# Patient Record
Sex: Female | Born: 1935
Health system: Southern US, Community
[De-identification: ages and names within clinical notes are randomized; demographics above are authoritative.]

## PROBLEM LIST (undated history)

## (undated) DIAGNOSIS — K224 Dyskinesia of esophagus: Secondary | ICD-10-CM

## (undated) DIAGNOSIS — N200 Calculus of kidney: Secondary | ICD-10-CM

## (undated) DIAGNOSIS — H269 Unspecified cataract: Secondary | ICD-10-CM

## (undated) DIAGNOSIS — C55 Malignant neoplasm of uterus, part unspecified: Secondary | ICD-10-CM

## (undated) DIAGNOSIS — I10 Essential (primary) hypertension: Secondary | ICD-10-CM

## (undated) HISTORY — DX: Dyskinesia of esophagus: K22.4

## (undated) HISTORY — PX: ABDOMINAL HYSTERECTOMY: SHX81

## (undated) HISTORY — PX: CATARACT EXTRACTION, BILATERAL: SHX1313

## (undated) HISTORY — DX: Malignant neoplasm of uterus, part unspecified: C55

## (undated) HISTORY — PX: BACK SURGERY: SHX140

## (undated) HISTORY — DX: Unspecified cataract: H26.9

## (undated) HISTORY — DX: Calculus of kidney: N20.0

---

## 2007-08-31 ENCOUNTER — Encounter: Payer: Self-pay | Admitting: Family Medicine

## 2007-10-06 ENCOUNTER — Encounter: Payer: Self-pay | Admitting: Family Medicine

## 2007-10-12 ENCOUNTER — Ambulatory Visit: Payer: Self-pay | Admitting: Family Medicine

## 2007-10-12 DIAGNOSIS — I10 Essential (primary) hypertension: Secondary | ICD-10-CM

## 2007-10-12 DIAGNOSIS — E785 Hyperlipidemia, unspecified: Secondary | ICD-10-CM

## 2007-10-26 ENCOUNTER — Encounter: Admission: RE | Admit: 2007-10-26 | Discharge: 2007-10-26 | Payer: Self-pay | Admitting: Family Medicine

## 2007-10-26 ENCOUNTER — Ambulatory Visit: Payer: Self-pay | Admitting: Family Medicine

## 2007-10-26 DIAGNOSIS — R0989 Other specified symptoms and signs involving the circulatory and respiratory systems: Secondary | ICD-10-CM

## 2007-10-26 DIAGNOSIS — M542 Cervicalgia: Secondary | ICD-10-CM

## 2007-10-26 LAB — CONVERTED CEMR LAB
Cholesterol, target level: 200 mg/dL
HDL goal, serum: 40 mg/dL
Protein, U semiquant: NEGATIVE
Urobilinogen, UA: NEGATIVE
pH: 6

## 2007-10-27 ENCOUNTER — Encounter: Payer: Self-pay | Admitting: Family Medicine

## 2007-10-27 LAB — CONVERTED CEMR LAB
AST: 17 units/L (ref 0–37)
BUN: 12 mg/dL (ref 6–23)
Calcium: 10.2 mg/dL (ref 8.4–10.5)
Glucose, Bld: 149 mg/dL — ABNORMAL HIGH (ref 70–99)
Total Protein: 7.5 g/dL (ref 6.0–8.3)

## 2007-10-28 DIAGNOSIS — N1831 Chronic kidney disease, stage 3a: Secondary | ICD-10-CM | POA: Insufficient documentation

## 2007-10-28 DIAGNOSIS — N183 Chronic kidney disease, stage 3 (moderate): Secondary | ICD-10-CM

## 2007-11-02 ENCOUNTER — Telehealth: Payer: Self-pay | Admitting: Family Medicine

## 2007-11-07 ENCOUNTER — Encounter: Payer: Self-pay | Admitting: Family Medicine

## 2007-11-09 ENCOUNTER — Ambulatory Visit (HOSPITAL_COMMUNITY): Admission: RE | Admit: 2007-11-09 | Discharge: 2007-11-09 | Payer: Self-pay | Admitting: Family Medicine

## 2007-11-09 ENCOUNTER — Ambulatory Visit: Payer: Self-pay | Admitting: *Deleted

## 2007-11-09 ENCOUNTER — Encounter: Payer: Self-pay | Admitting: Family Medicine

## 2007-11-28 ENCOUNTER — Ambulatory Visit: Payer: Self-pay | Admitting: Family Medicine

## 2007-11-30 ENCOUNTER — Encounter: Payer: Self-pay | Admitting: Family Medicine

## 2007-11-30 ENCOUNTER — Telehealth: Payer: Self-pay | Admitting: Family Medicine

## 2007-12-01 LAB — CONVERTED CEMR LAB
Chloride: 99 meq/L (ref 96–112)
Cholesterol: 167 mg/dL (ref 0–200)
Creatinine, Ser: 1.23 mg/dL — ABNORMAL HIGH (ref 0.40–1.20)
Helicobacter Pylori Antibody-IgG: 6.1 — ABNORMAL HIGH
LDL Cholesterol: 90 mg/dL (ref 0–99)
Potassium: 4.8 meq/L (ref 3.5–5.3)
Sodium: 133 meq/L — ABNORMAL LOW (ref 135–145)
Total Bilirubin: 0.7 mg/dL (ref 0.3–1.2)
Total CHOL/HDL Ratio: 3.4
VLDL: 28 mg/dL (ref 0–40)

## 2007-12-05 ENCOUNTER — Emergency Department (HOSPITAL_COMMUNITY): Admission: EM | Admit: 2007-12-05 | Discharge: 2007-12-05 | Payer: Self-pay | Admitting: Emergency Medicine

## 2007-12-05 ENCOUNTER — Telehealth: Payer: Self-pay | Admitting: Family Medicine

## 2007-12-08 ENCOUNTER — Ambulatory Visit: Payer: Self-pay | Admitting: Family Medicine

## 2007-12-08 DIAGNOSIS — K224 Dyskinesia of esophagus: Secondary | ICD-10-CM

## 2007-12-09 LAB — CONVERTED CEMR LAB
CO2: 26 meq/L (ref 19–32)
Calcium: 10 mg/dL (ref 8.4–10.5)
Chloride: 100 meq/L (ref 96–112)
Creatinine, Ser: 1.26 mg/dL — ABNORMAL HIGH (ref 0.40–1.20)
Glucose, Bld: 103 mg/dL — ABNORMAL HIGH (ref 70–99)
Potassium: 4.4 meq/L (ref 3.5–5.3)
Sodium: 138 meq/L (ref 135–145)

## 2007-12-29 DIAGNOSIS — K219 Gastro-esophageal reflux disease without esophagitis: Secondary | ICD-10-CM | POA: Insufficient documentation

## 2007-12-30 ENCOUNTER — Ambulatory Visit: Payer: Self-pay | Admitting: Gastroenterology

## 2008-01-02 ENCOUNTER — Encounter: Payer: Self-pay | Admitting: Gastroenterology

## 2008-01-02 ENCOUNTER — Ambulatory Visit: Payer: Self-pay | Admitting: Gastroenterology

## 2008-01-02 LAB — CONVERTED CEMR LAB
Ferritin: 119 ng/mL (ref 10.0–291.0)
Folate: 13.8 ng/mL
Transferrin: 268.8 mg/dL (ref >=212.0)
Vitamin B-12: 170 pg/mL — ABNORMAL LOW (ref 211–911)

## 2008-01-03 ENCOUNTER — Telehealth: Payer: Self-pay | Admitting: Gastroenterology

## 2008-01-04 ENCOUNTER — Ambulatory Visit: Payer: Self-pay | Admitting: Gastroenterology

## 2008-01-04 ENCOUNTER — Ambulatory Visit (HOSPITAL_COMMUNITY): Admission: RE | Admit: 2008-01-04 | Discharge: 2008-01-04 | Payer: Self-pay | Admitting: Gastroenterology

## 2008-01-04 DIAGNOSIS — E538 Deficiency of other specified B group vitamins: Secondary | ICD-10-CM | POA: Insufficient documentation

## 2008-01-08 ENCOUNTER — Encounter: Payer: Self-pay | Admitting: Gastroenterology

## 2008-01-11 ENCOUNTER — Ambulatory Visit: Payer: Self-pay | Admitting: Gastroenterology

## 2008-01-18 ENCOUNTER — Ambulatory Visit: Payer: Self-pay | Admitting: Gastroenterology

## 2008-01-23 ENCOUNTER — Telehealth: Payer: Self-pay | Admitting: Gastroenterology

## 2008-01-25 ENCOUNTER — Ambulatory Visit (HOSPITAL_COMMUNITY): Admission: RE | Admit: 2008-01-25 | Discharge: 2008-01-25 | Payer: Self-pay | Admitting: Gastroenterology

## 2008-02-13 ENCOUNTER — Ambulatory Visit: Payer: Self-pay | Admitting: Family Medicine

## 2008-02-17 ENCOUNTER — Ambulatory Visit: Payer: Self-pay | Admitting: Family Medicine

## 2008-02-20 ENCOUNTER — Ambulatory Visit (HOSPITAL_COMMUNITY): Admission: RE | Admit: 2008-02-20 | Discharge: 2008-02-20 | Payer: Self-pay | Admitting: Gastroenterology

## 2008-02-20 ENCOUNTER — Ambulatory Visit: Payer: Self-pay | Admitting: Gastroenterology

## 2008-02-27 ENCOUNTER — Telehealth: Payer: Self-pay | Admitting: Gastroenterology

## 2008-03-06 ENCOUNTER — Telehealth: Payer: Self-pay | Admitting: Gastroenterology

## 2008-03-19 ENCOUNTER — Ambulatory Visit: Payer: Self-pay | Admitting: Family Medicine

## 2008-03-19 ENCOUNTER — Telehealth: Payer: Self-pay | Admitting: Gastroenterology

## 2008-03-20 ENCOUNTER — Telehealth: Payer: Self-pay | Admitting: Family Medicine

## 2008-04-18 ENCOUNTER — Ambulatory Visit: Payer: Self-pay | Admitting: Family Medicine

## 2008-04-30 ENCOUNTER — Ambulatory Visit: Payer: Self-pay | Admitting: Family Medicine

## 2008-04-30 LAB — CONVERTED CEMR LAB
Glucose, Urine, Semiquant: NEGATIVE
Nitrite: NEGATIVE
Specific Gravity, Urine: 1.01
Urobilinogen, UA: 0.2
pH: 6.5

## 2008-05-17 ENCOUNTER — Ambulatory Visit: Payer: Self-pay | Admitting: Family Medicine

## 2008-05-21 ENCOUNTER — Telehealth: Payer: Self-pay | Admitting: Family Medicine

## 2008-05-28 ENCOUNTER — Ambulatory Visit: Payer: Self-pay | Admitting: Family Medicine

## 2008-05-28 DIAGNOSIS — R002 Palpitations: Secondary | ICD-10-CM | POA: Insufficient documentation

## 2008-05-29 ENCOUNTER — Encounter: Payer: Self-pay | Admitting: Family Medicine

## 2008-05-29 LAB — CONVERTED CEMR LAB
ALT: 16 units/L (ref 0–35)
Basophils Absolute: 0.1 10*3/uL (ref 0.0–0.1)
CO2: 18 meq/L — ABNORMAL LOW (ref 19–32)
Chloride: 99 meq/L (ref 96–112)
Creatinine, Ser: 1.15 mg/dL (ref 0.40–1.20)
Glucose, Bld: 99 mg/dL (ref 70–99)
HCT: 41.2 % (ref 36.0–46.0)
Hemoglobin: 13.9 g/dL (ref 12.0–15.0)
MCHC: 33.7 g/dL (ref 30.0–36.0)
Monocytes Absolute: 0.5 10*3/uL (ref 0.1–1.0)
RDW: 12.6 % (ref 11.5–15.5)
Total Protein: 7.6 g/dL (ref 6.0–8.3)
WBC: 11.2 10*3/uL — ABNORMAL HIGH (ref 4.0–10.5)

## 2008-05-30 ENCOUNTER — Ambulatory Visit: Payer: Self-pay | Admitting: Cardiology

## 2008-06-11 ENCOUNTER — Encounter: Payer: Self-pay | Admitting: Cardiology

## 2008-06-11 ENCOUNTER — Ambulatory Visit: Payer: Self-pay | Admitting: Cardiology

## 2008-06-11 ENCOUNTER — Ambulatory Visit: Payer: Self-pay

## 2008-06-11 LAB — CONVERTED CEMR LAB
BUN: 16 mg/dL (ref 6–23)
CO2: 27 meq/L (ref 19–32)
Calcium: 10 mg/dL (ref 8.4–10.5)
Creatinine, Ser: 1.1 mg/dL (ref 0.4–1.2)
GFR calc Af Amer: 63 mL/min
Glucose, Bld: 86 mg/dL (ref 70–99)
Sodium: 135 meq/L (ref 135–145)

## 2008-07-04 ENCOUNTER — Ambulatory Visit: Payer: Self-pay | Admitting: Cardiology

## 2008-12-04 ENCOUNTER — Ambulatory Visit: Payer: Self-pay | Admitting: Family Medicine

## 2008-12-04 ENCOUNTER — Encounter: Admission: RE | Admit: 2008-12-04 | Discharge: 2008-12-04 | Payer: Self-pay | Admitting: Family Medicine

## 2008-12-05 ENCOUNTER — Telehealth: Payer: Self-pay | Admitting: Family Medicine

## 2008-12-05 LAB — CONVERTED CEMR LAB
ALT: 11 units/L (ref 0–35)
AST: 18 units/L (ref 0–37)
Albumin: 4.2 g/dL (ref 3.5–5.2)
BUN: 9 mg/dL (ref 6–23)
Calcium: 9 mg/dL (ref 8.4–10.5)
Chloride: 106 meq/L (ref 96–112)
Cholesterol: 177 mg/dL (ref 0–200)
LDL Cholesterol: 115 mg/dL — ABNORMAL HIGH (ref 0–99)
Potassium: 4.4 meq/L (ref 3.5–5.3)
Sodium: 141 meq/L (ref 135–145)
Total Bilirubin: 0.5 mg/dL (ref 0.3–1.2)
Total CHOL/HDL Ratio: 3.8
VLDL: 16 mg/dL (ref 0–40)

## 2008-12-26 ENCOUNTER — Ambulatory Visit: Payer: Self-pay | Admitting: Family Medicine

## 2009-01-08 ENCOUNTER — Ambulatory Visit: Payer: Self-pay | Admitting: Family Medicine

## 2009-01-08 ENCOUNTER — Encounter: Payer: Self-pay | Admitting: Family Medicine

## 2009-02-11 ENCOUNTER — Encounter: Payer: Self-pay | Admitting: Cardiology

## 2009-02-11 ENCOUNTER — Telehealth: Payer: Self-pay | Admitting: Cardiology

## 2009-05-27 ENCOUNTER — Ambulatory Visit: Payer: Self-pay | Admitting: Family Medicine

## 2009-05-27 LAB — CONVERTED CEMR LAB
Bilirubin Urine: NEGATIVE
Glucose, Urine, Semiquant: NEGATIVE
Ketones, urine, test strip: NEGATIVE
Protein, U semiquant: NEGATIVE
Urobilinogen, UA: 0.2
pH: 7

## 2009-05-28 ENCOUNTER — Encounter: Payer: Self-pay | Admitting: Family Medicine

## 2009-08-16 ENCOUNTER — Ambulatory Visit: Payer: Self-pay | Admitting: Family Medicine

## 2009-08-19 ENCOUNTER — Telehealth: Payer: Self-pay | Admitting: Family Medicine

## 2009-08-19 LAB — CONVERTED CEMR LAB
CO2: 20 meq/L (ref 19–32)
Creatinine, Ser: 1.02 mg/dL (ref 0.40–1.20)
Glucose, Bld: 82 mg/dL (ref 70–99)
Sodium: 139 meq/L (ref 135–145)

## 2009-12-09 ENCOUNTER — Ambulatory Visit: Payer: Self-pay | Admitting: Family Medicine

## 2009-12-09 DIAGNOSIS — M545 Low back pain, unspecified: Secondary | ICD-10-CM | POA: Insufficient documentation

## 2009-12-09 LAB — CONVERTED CEMR LAB
Nitrite: NEGATIVE
Protein, U semiquant: NEGATIVE
Specific Gravity, Urine: 1.01
Urobilinogen, UA: 0.2

## 2009-12-10 ENCOUNTER — Encounter: Payer: Self-pay | Admitting: Family Medicine

## 2010-01-02 ENCOUNTER — Ambulatory Visit: Payer: Self-pay | Admitting: Family

## 2010-01-02 ENCOUNTER — Encounter: Admission: RE | Admit: 2010-01-02 | Discharge: 2010-01-02 | Payer: Self-pay | Admitting: Family Medicine

## 2010-01-02 ENCOUNTER — Telehealth: Payer: Self-pay | Admitting: Family

## 2010-01-02 LAB — CONVERTED CEMR LAB
Glucose, Urine, Semiquant: NEGATIVE
Ketones, urine, test strip: NEGATIVE
Nitrite: NEGATIVE

## 2010-01-04 ENCOUNTER — Encounter: Payer: Self-pay | Admitting: Family

## 2010-01-07 ENCOUNTER — Telehealth: Payer: Self-pay | Admitting: Family

## 2010-01-16 ENCOUNTER — Ambulatory Visit: Payer: Self-pay | Admitting: Family

## 2010-01-16 ENCOUNTER — Encounter: Payer: Self-pay | Admitting: Family Medicine

## 2010-01-17 LAB — CONVERTED CEMR LAB
CO2: 24 meq/L (ref 19–32)
Chloride: 105 meq/L (ref 96–112)
Sodium: 140 meq/L (ref 135–145)

## 2010-02-13 ENCOUNTER — Ambulatory Visit: Payer: Self-pay | Admitting: Family

## 2010-04-17 ENCOUNTER — Ambulatory Visit: Payer: Self-pay | Admitting: Family Medicine

## 2010-05-07 ENCOUNTER — Encounter: Payer: Self-pay | Admitting: Family Medicine

## 2010-08-19 ENCOUNTER — Ambulatory Visit
Admission: RE | Admit: 2010-08-19 | Discharge: 2010-08-19 | Payer: Self-pay | Source: Home / Self Care | Attending: Family Medicine | Admitting: Family Medicine

## 2010-08-31 LAB — CONVERTED CEMR LAB
Bilirubin Urine: NEGATIVE
Ketones, urine, test strip: NEGATIVE
Nitrite: NEGATIVE
Specific Gravity, Urine: <1.005
Urobilinogen, UA: 0.2

## 2010-09-04 NOTE — Assessment & Plan Note (Signed)
Summary: 3 WEEK FU   Vital Signs:  Patient profile:   77 year old female Height:      63 inches Weight:      128 pounds Pulse rate:   85 / minute BP sitting:   146 / 72  (left arm) Cuff size:   regular  Vitals Entered By: [name] (January 02, 2010 3:02 PM) CC: continues to have right sided flank pain   Primary Care Provider:  [name], M.D.  CC:  continues to have right sided flank pain.  History of Present Illness: Ms Foos is a 77 year old female with history of back pain and urinary frequency.   1) Back pain- Notes back pain has moved from the mid lower back to the right lower back.  Pain is worse with certain movements.  She notes some improvement with the use of flexeril and diclofenac.  Urinary frequency has improved.  Completed rx for e coli. UTI.  She does report + history of kidney stones back in the 1960's.  She denies fever but does report + chills.  Current Medications (verified): 1)  Metoprolol Tartrate 50 Mg Tabs (Metoprolol Tartrate) .... Take 1 Tablet By Mouth Two Times A Day 2)  Prevacid 30 Mg Cpdr (Lansoprazole) .Marland Kitchen.. 1 By Mouth Daily 3)  Centrum Silver  Chew (Multiple Vitamins-Minerals) .... By Mouth Daily 4)  B-12 250 Mcg Tabs (Cyanocobalamin) .... By Mouth Daily 5)  Alendronate Sodium 70 Mg Tabs (Alendronate Sodium) .... One By Mouth Once A Week. 6)  Diclofenac Sodium 75 Mg Tbec (Diclofenac Sodium) .... Take 1 Tablet By Mouth Two Times A Day 7)  Flexeril 10 Mg Tabs (Cyclobenzaprine Hcl) .... One By Mouth Three Times A Day   As Needed For Muscle Spasm.  Allergies (verified): 1)  ! Pcn 2)  ! Sulfa 3)  ! Aspirin 4)  ! Tofranil (Imipramine Hcl) 5)  ! Cisapride (Cisapride)  Comments:  Nurse/Medical Assistant: The patient's medications and allergies were reviewed with the patient and were updated in the Medication and Allergy Lists. [name] (January 02, 2010 3:04 PM)  Past History:  Past Medical History: Last updated: 12/29/2007 Current  Problems:  GERD (ICD-530.81) OTHER ABNORMAL GLUCOSE (ICD-790.29) DYSKINESIA OF ESOPHAGUS (ICD-530.5) ABDOMINAL PAIN, GENERALIZED (ICD-789.07) CHRONIC KIDNEY DISEASE STAGE III (MODERATE) (ICD-585.3) HEMATURIA UNSPECIFIED (ICD-599.70) CAROTID BRUIT, LEFT (ICD-785.9) NECK PAIN (ICD-723.1) HYPERLIPIDEMIA (ICD-272.4) HYPERTENSION, BENIGN (ICD-401.1)  Past Surgical History: Last updated: 12/09/2009 Hysterectomy for uterine cancer.  Still has ovaries.  Low back surgery in 1983  Family History: Last updated: 12/30/2007 Father with HTN, and cholesterol, MI age 19.  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  Had 4 children, 3 living: Michael Boston, Danny.   Former Smoker Regular exercise-yes Alcohol Use - no Daily Caffeine Use  Risk Factors: Caffeine Use: 2 (10/12/2007) Exercise: yes (10/12/2007)  Risk Factors: Smoking Status: quit (10/12/2007)  Physical Exam  General:  Well-developed,well-nourished,in no acute distress; alert,appropriate and cooperative throughout examination Head:  Normocephalic and atraumatic without obvious abnormalities. No apparent alopecia or balding. Lungs:  Normal respiratory effort, chest expands symmetrically. Lungs are clear to auscultation, no crackles or wheezes. Heart:  Normal rate and regular rhythm. S1 and S2 normal without gallop, murmur, click, rub or other extra sounds. Msk:  negative CVAT, some mild tenderness to palpation to the right of the  lumbar spine Psych:  Cognition and judgment appear intact. Alert and cooperative with normal attention span and concentration. No apparent delusions, illusions, hallucinations   Impression & Recommendations:  Problem # 1:  FLANK PAIN, RIGHT (ICD-789.09) Assessment Unchanged Pain was initially mid-line in the lower back,  now a little higher and located lateral to the spine on the right.  Pt completed cipro, urinary frequency has improved, but still has + microscopic hematuria.  Pt reports + hx of kidney stones.  Reviewed CT with Dr. Pecolia Ades of radiology.  Noted 2 right renal calculi and no ureteral calculi or bladder calculi.  Note was made of L5-S1 Degenerative disc disease. Suspect that symptoms are MS in nature.   Her updated medication list for this problem includes:    Diclofenac Sodium 75 Mg Tbec (Diclofenac sodium) .Marland Kitchen... Take 1 tablet by mouth two times a day    Flexeril 10 Mg Tabs (Cyclobenzaprine hcl) ..... One by mouth three times a day   as needed for muscle spasm.  Orders: CT (CT) T-Culture, Urine ([id number])  Complete Medication List: 1)  Metoprolol Tartrate 50 Mg Tabs (Metoprolol tartrate) .... Take 1 tablet by mouth two times a day 2)  Prevacid 30 Mg Cpdr (Lansoprazole) .Marland Kitchen.. 1 by mouth daily 3)  Centrum Silver Chew (Multiple vitamins-minerals) .... By mouth daily 4)  B-12 250 Mcg Tabs (Cyanocobalamin) .... By mouth daily 5)  Alendronate Sodium 70 Mg Tabs (Alendronate sodium) .... One by mouth once a week. 6)  Diclofenac Sodium 75 Mg Tbec (Diclofenac sodium) .... Take 1 tablet by mouth two times a day 7)  Flexeril 10 Mg Tabs (Cyclobenzaprine hcl) .... One by mouth three times a day   as needed for muscle spasm.  Other Orders: UA Dipstick w/o Micro (automated)  (81003)  Patient Instructions: 1)  Please complete your CT scan downstairs 2)  Call if your symptoms worsen or do not improve. 3)  Follow up in 2 weeks.  Laboratory Results   Urine Tests  Date/Time Received: 01/02/2010 Date/Time Reported: 01/02/2010  Routine Urinalysis   Color: yellow Appearance: Clear Glucose: negative   (Normal Range: Negative) Bilirubin: negative   (Normal Range: Negative) Ketone: negative   (Normal Range: Negative) Spec. Gravity: <1.005   (Normal Range: 1.003-1.035) Blood: small   (Normal Range:  Negative) pH: 5.5   (Normal Range: 5.0-8.0) Protein: negative   (Normal Range: Negative) Urobilinogen: 0.2   (Normal Range: 0-1) Nitrite: negative   (Normal Range: Negative) Leukocyte Esterace: negative   (Normal Range: Negative)

## 2010-09-04 NOTE — Progress Notes (Signed)
  Phone Note Outgoing Call   Summary of Call: Pls call patient and let her know that her urine did grow some bacteria.  I would like to treat her with cipro.  Have sent to her pharmacy. Initial call taken by: Lemont Fillers FNP,  January 07, 2010 5:29 PM  Follow-up for Phone Call        Pt notified of results and that med sent to pharmacy Follow-up by: [name],  January 08, 2010 8:23 AM    New/Updated Medications: CIPRO 500 MG TABS (CIPROFLOXACIN HCL) one tablet by mouth two times a day x 3 days Prescriptions: CIPRO 500 MG TABS (CIPROFLOXACIN HCL) one tablet by mouth two times a day x 3 days  #6 x 0   Entered and Authorized by:   Lemont Fillers FNP   Signed by:   Lemont Fillers FNP on 01/07/2010   Method used:   Electronically to        [facility] [phone]* (retail)       [address] [place], [place]  [address]       Ph: [phone]       Fax: [phone]   RxID:   [id number]

## 2010-09-04 NOTE — Assessment & Plan Note (Signed)
Summary: Trap strain, lipids   Vital Signs:  Patient profile:   77 year old female Height:      63 inches Weight:      133 pounds Pulse rate:   63 / minute BP sitting:   151 / 77  (right arm) Cuff size:   regular  Vitals Entered By: Avon Gully CMA, Duncan Dull) (August 19, 2010 1:18 PM) CC: rt shoulder pain   Primary Care Provider:  [name], M.D.  CC:  rt shoulder pain.  History of Present Illness: Starte about 12-10 and has had pain in the right side of neck and upper shoulder. Has tried Tyelnol and soaks in warm bath.  Got worse again last night.  Worse with turning her head.No alleviating sxs.  Aggrevated by lifting her right shoulder.   Current Medications (verified): 1)  Metoprolol Tartrate 50 Mg Tabs (Metoprolol Tartrate) .... Take 1 Tablet By Mouth Two Times A Day 2)  Centrum Silver  Chew (Multiple Vitamins-Minerals) .... By Mouth Daily 3)  B-12 250 Mcg Tabs (Cyanocobalamin) .... By Mouth Daily 4)  Norvasc 5 Mg Tabs (Amlodipine Besylate) .... One Tablet By Mouth Daily 5)  Zostavax 16109 Unt/0.91ml Solr (Zoster Vaccine Live) .... Inject One Dose Sq  Allergies (verified): 1)  ! Pcn 2)  ! Sulfa 3)  ! Aspirin 4)  ! Tofranil (Imipramine Hcl) 5)  ! Cisapride (Cisapride)  Comments:  Nurse/Medical Assistant: The patient's medications and allergies were reviewed with the patient and were updated in the Medication and Allergy Lists. Avon Gully CMA, Duncan Dull) (August 19, 2010 1:20 PM)  Physical Exam  General:  Well-developed,well-nourished,in no acute distress; alert,appropriate and cooperative throughout examination Msk:  Neck with normal flexion. Dec extension and dec rotation to the right compared to left. Normal side bending. Shoulders with NROM and strenght 5/5 bilat.    Impression & Recommendations:  Problem # 1:  MUSCLE SPASM, TRAPEZIUS (ICD-728.85) Warm moist heat. NSAIDs and muscle relaxer.   H.O. neck stretches.   F/U if not better in 2-3  weeks.   Problem # 2:  HYPERLIPIDEMIA (ICD-272.4) Due to recheck and make sure at goal.  Orders: T-Comprehensive Metabolic Panel [id number]) T-Lipid Profile [id number])  Labs Reviewed: SGOT: 18 (12/04/2008)   SGPT: 11 (12/04/2008)  Lipid Goals: Chol Goal: 200 (10/26/2007)   HDL Goal: 40 (10/26/2007)   LDL Goal: 130 (10/26/2007)   TG Goal: 150 (10/26/2007)  Prior 10 Yr Risk Heart Disease: 15 % (12/26/2008)   HDL:46 (12/04/2008), 49 (11/30/2007)  LDL:115 (12/04/2008), 90 (13/03/6577)  Chol:177 (12/04/2008), 167 (11/30/2007)  Trig:81 (12/04/2008), 138 (11/30/2007)  Complete Medication List: 1)  Metoprolol Tartrate 50 Mg Tabs (Metoprolol tartrate) .... Take 1 tablet by mouth two times a day 2)  Centrum Silver Chew (Multiple vitamins-minerals) .... By mouth daily 3)  B-12 250 Mcg Tabs (Cyanocobalamin) .... By mouth daily 4)  Norvasc 5 Mg Tabs (Amlodipine besylate) .... One tablet by mouth daily 5)  Diclofenac Sodium 75 Mg Tbec (Diclofenac sodium) .... Take 1 tablet by mouth two times a day for 7 days 6)  Flexeril 10 Mg Tabs (Cyclobenzaprine hcl) .... 1/2 to 1 tab by mouth up to three times a day as needed muscle spasm.  Patient Instructions: 1)  Call if not better in 2 weeks.   2)  Warm moist heat.  3)  Can start the stretches and use the medicine for pain relief.  4)  Please schedule a follow-up appointment in 1 month to recheck your blood pressure.  Prescriptions: NORVASC  5 MG TABS (AMLODIPINE BESYLATE) one tablet by mouth daily  #90 x 1   Entered and Authorized by:   [name] MD   Signed by:   [name] MD on 08/19/2010   Method used:   Electronically to        [facility] [phone]* (retail)       [address] [place], [place]  [address]       Ph: [phone]       Fax: [phone]   RxID:   [id number] FLEXERIL 10 MG TABS (CYCLOBENZAPRINE HCL) 1/2 to 1 tab by mouth up to three times a day as needed muscle spasm.  #30 x 0   Entered and  Authorized by:   [name] MD   Signed by:   [name] MD on 08/19/2010   Method used:   Electronically to        [facility] [phone]* (retail)       [address] [place], [place]  [address]       Ph: [phone]       Fax: [phone]   RxID:   [id number] DICLOFENAC SODIUM 75 MG TBEC (DICLOFENAC SODIUM) Take 1 tablet by mouth two times a day for 7 days  #14 x 0   Entered and Authorized by:   [name] MD   Signed by:   [name] MD on 08/19/2010   Method used:   Electronically to        [facility] [phone]* (retail)       [address] [place], [place]  [address]       Ph: [phone]       Fax: [phone]   RxID:   [id number]    Orders Added: 1)  T-Comprehensive Metabolic Panel [[id number]] 2)  T-Lipid Profile [[id number]] 3)  Est. Patient Level IV [[address]144]

## 2010-09-04 NOTE — Progress Notes (Signed)
Summary: Not feeling any better  Phone Note Call from Patient Call back at Home Phone [phone]   Caller: Patient Call For: [name] MD Summary of Call: Pt calls and states is not feeling any better and told to call and would call in antibiotic. Uses [facility] Initial call taken by: [name],  August 19, 2009 8:57 AM    New/Updated Medications: ZITHROMAX Z-PAK 250 MG TABS (AZITHROMYCIN) Take as directed. Prescriptions: ZITHROMAX Z-PAK 250 MG TABS (AZITHROMYCIN) Take as directed.  #1 pack x 0   Entered and Authorized by:   [name] MD   Signed by:   [name] MD on 08/19/2009   Method used:   Electronically to        [facility] [phone]* (retail)       [address] [place], [place]  [address]       Ph: [phone]       Fax: [phone]   RxID:   [id number] ZITHROMAX Z-PAK 250 MG TABS (AZITHROMYCIN) Take as directed.  #1 pack x 0   Entered and Authorized by:   [name] MD   Signed by:   [name] MD on 08/19/2009   Method used:   Electronically to        [facility]* (retail)       [address] [place], [place]  [address]       Ph: [phone]       Fax: [phone]   RxID:   [id number]

## 2010-09-04 NOTE — Progress Notes (Signed)
  Phone Note Outgoing Call   Call placed by: Lemont Fillers FNP,  January 02, 2010 5:43 PM Call placed to: Patient Summary of Call: Left message for patient to return call.  When she calls back tomorrow pls let her know that CT shows some right sided kidney stones, but none are obstructing.  Also notes degenerative disc disease L5-S1- this is most likely cause of her pain.  She should cont. flexeril, diclofenac as needed and call if symptoms worsen. Initial call taken by: Lemont Fillers FNP,  January 02, 2010 5:45 PM  Follow-up for Phone Call        Pt notified of results and MD instructions.  Follow-up by: [name],  January 03, 2010 10:58 AM

## 2010-09-04 NOTE — Assessment & Plan Note (Signed)
Summary: f/u  back pain   Vital Signs:  Patient profile:   77 year old female Height:      63 inches Weight:      131 pounds Pulse rate:   66 / minute BP sitting:   120 / 66  (left arm) Cuff size:   regular  Vitals Entered By: Avon Gully CMA, Duncan Dull) (April 17, 2010 1:10 PM) CC: f/u back pain-feels better  3  Primary Care Provider:  [name], M.D.  CC:  f/u back pain-feels better.  History of Present Illness: sTill some painn in the right low back. Overall better.  Still stiff. She is doing some stretches and exrecises.  Pain is much better overal.  Only last for a few minutes.  Feels some stiff in the morning.    Current Medications (verified): 1)  Metoprolol Tartrate 50 Mg Tabs (Metoprolol Tartrate) .... Take 1 Tablet By Mouth Two Times A Day 2)  Centrum Silver  Chew (Multiple Vitamins-Minerals) .... By Mouth Daily 3)  B-12 250 Mcg Tabs (Cyanocobalamin) .... By Mouth Daily 4)  Norvasc 5 Mg Tabs (Amlodipine Besylate) .... One Tablet By Mouth Daily  Allergies (verified): 1)  ! Pcn 2)  ! Sulfa 3)  ! Aspirin 4)  ! Tofranil (Imipramine Hcl) 5)  ! Cisapride (Cisapride)  Comments:  Nurse/Medical Assistant: The patient's medications and allergies were reviewed with the patient and were updated in the Medication and Allergy Lists. Avon Gully CMA, Duncan Dull) (April 17, 2010 1:11 PM) Flu Vaccine Consent Questions     Do you have a history of severe allergic reactions to this vaccine? no    Any prior history of allergic reactions to egg and/or gelatin? no    Do you have a sensitivity to the preservative Thimersol? no    Do you have a past history of Guillan-Barre Syndrome? no    Do you currently have an acute febrile illness? no    Have you ever had a severe reaction to latex? no    Vaccine information given and explained to patient? yes    Are you currently pregnant? no    Lot Number:[id number]   Exp Date:01/31/2011   Site Given  Left Deltoid  IM .lbflu  Physical Exam  General:  Well-developed,well-nourished,in no acute distress; alert,appropriate and cooperative throughout examination Msk:  Normal flexion. Dec extension. Normal rotation right and left and normal side bending. Nontender spine. Slightly tender right flank right below the rib cage.  Neg straight leg raise. Hip, knee and ankel strength 5/5.  Extremities:  o LE edema.    Impression & Recommendations:  Problem # 1:  FLANK PAIN, RIGHT (ICD-789.09) She overall is much better. Can use Tylenol as needed for pain control at this ponit. Call if gets worse adn can consider PT. She is not interested in PT at this time.  The following medications were removed from the medication list:    Diclofenac Sodium 75 Mg Tbec (Diclofenac sodium) .Marland Kitchen... Take 1 tablet by mouth two times a day    Flexeril 10 Mg Tabs (Cyclobenzaprine hcl) ..... One by mouth three times a day   as needed for muscle spasm.  Complete Medication List: 1)  Metoprolol Tartrate 50 Mg Tabs (Metoprolol tartrate) .... Take 1 tablet by mouth two times a day 2)  Centrum Silver Chew (Multiple vitamins-minerals) .... By mouth daily 3)  B-12 250 Mcg Tabs (Cyanocobalamin) .... By mouth daily 4)  Norvasc 5 Mg Tabs (Amlodipine besylate) .... One tablet by mouth daily  5)  Zostavax 16109 Unt/0.41ml Solr (Zoster vaccine live) .... Inject one dose sq  Other Orders: T-Dual DXA Bone Density/ Axial (60454) Admin 1st Vaccine (09811) Flu Vaccine 42yrs + (91478)  Patient Instructions: 1)  Call if not better or gets worse again.  2)  You were given the flu shot today.  Prescriptions: ZOSTAVAX 29562 UNT/0.65ML SOLR (ZOSTER VACCINE LIVE) inject one dose SQ  #1 x 0   Entered and Authorized by:   [name] MD   Signed by:   [name] MD on 04/17/2010   Method used:   Print then Give to Patient   RxID:   [id number]   PAP Next Due:  Refused Last Mammogram:  Normal (12/05/2008 12:15:27 PM) Mammogram Next  Due:  2 yr Bone Density Result Date:  12/05/2008 Bone Density Result:  abnormal Bone Density Next Due: 2 yr

## 2010-09-04 NOTE — Assessment & Plan Note (Signed)
Summary: Lumbago, UTI   Vital Signs:  Patient profile:   77 year old female Height:      63 inches Weight:      129 pounds BMI:     22.93 Temp:     98.0 degrees F oral Pulse rate:   68 / minute BP sitting:   144 / 69  (left arm) Cuff size:   regular  Vitals Entered By: [name] (Dec 09, 2009 1:46 PM) CC: lower back pain x 3 days- radiates into sides of legs- urinary frequency   Primary Care Provider:  [name], M.D.  CC:  lower back pain x 3 days- radiates into sides of legs- urinary frequency.  History of Present Illness: lower back pain x 3 days- radiates into sides of legs- urinary frequency. Hx of kidney stones. No fever. No back gtrauam. Starting hurting when laying in the back.  Pain raditaing down teh outside of her thigh. Hx of lumbar disc surgery in 1983. Not taking any pain reliever for her pain. More painful when she bends over.  BEtter at rest.  No trauma.   Current Medications (verified): 1)  Metoprolol Tartrate 50 Mg Tabs (Metoprolol Tartrate) .... Take 1 Tablet By Mouth Two Times A Day 2)  Prevacid 30 Mg Cpdr (Lansoprazole) .Marland Kitchen.. 1 By Mouth Daily 3)  Centrum Silver  Chew (Multiple Vitamins-Minerals) .... By Mouth Daily 4)  B-12 250 Mcg Tabs (Cyanocobalamin) .... By Mouth Daily 5)  Alendronate Sodium 70 Mg Tabs (Alendronate Sodium) .... One By Mouth Once A Week.  Allergies (verified): 1)  ! Pcn 2)  ! Sulfa 3)  ! Aspirin 4)  ! Tofranil (Imipramine Hcl) 5)  ! Cisapride (Cisapride)  Comments:  Nurse/Medical Assistant: The patient's medications and allergies were reviewed with the patient and were updated in the Medication and Allergy Lists. [name] (Dec 09, 2009 1:47 PM)  Past History:  Past Surgical History: Hysterectomy for uterine cancer.  Still has ovaries.  Low back surgery in 1983  Physical Exam  General:  Well-developed,well-nourished,in no acute distress; alert,appropriate and cooperative throughout examination Msk:  Pain with  flexion. NOrmal extension, rotatatino and side bending right and left.  Tender over her surgical scar. No paraspinous or SI joint tenderness.  Neg straight leg raise. Hipk, knee, and ankle strength is 5/5. Pain in the left lumbar back with knee lift.   Extremities:  No LE edema.  Neurologic:  Patellar and achilles 2+ bilat.  Skin:  no rashes.   Psych:  Cognition and judgment appear intact. Alert and cooperative with normal attention span and concentration. No apparent delusions, illusions, hallucinations   Impression & Recommendations:  Problem # 1:  BACK PAIN, LUMBAR (ICD-724.2)  Discussed that her symptoms are consistant with lumbar strain.  Given H.O on exercises. Trial of muscle elaxer as well.  NSAID, gentle stretches and trial of muscle relaxer. If not better in 2-3 weeks then recommend xray of low back since has had previous surgery.    Her updated medication list for this problem includes:    Diclofenac Sodium 75 Mg Tbec (Diclofenac sodium) .Marland Kitchen... Take 1 tablet by mouth two times a day    Flexeril 10 Mg Tabs (Cyclobenzaprine hcl) ..... One by mouth three times a day   as needed for muscle spasm.  Problem # 2:  URINARY FREQUENCY (ICD-788.41) UA is + for LE and blood so will treat and will send a culture as well.  Orders: UA Dipstick w/o Micro (automated)  (  81003) T-Culture, Urine ([id number])  Complete Medication List: 1)  Metoprolol Tartrate 50 Mg Tabs (Metoprolol tartrate) .... Take 1 tablet by mouth two times a day 2)  Prevacid 30 Mg Cpdr (Lansoprazole) .Marland Kitchen.. 1 by mouth daily 3)  Centrum Silver Chew (Multiple vitamins-minerals) .... By mouth daily 4)  B-12 250 Mcg Tabs (Cyanocobalamin) .... By mouth daily 5)  Alendronate Sodium 70 Mg Tabs (Alendronate sodium) .... One by mouth once a week. 6)  Diclofenac Sodium 75 Mg Tbec (Diclofenac sodium) .... Take 1 tablet by mouth two times a day 7)  Flexeril 10 Mg Tabs (Cyclobenzaprine hcl) .... One by mouth three times a day   as needed  for muscle spasm. 8)  Ciprofloxacin Hcl 500 Mg Tabs (Ciprofloxacin hcl) .... Take 1 tablet by mouth two times a day for 3 days  Patient Instructions: 1)  Try the muscle relaxer up to three times a day if needed 2)  Review Hand out of exercies for your low back 3)  We will call you with the culture results of your urine in 3 days.  4)  STart diclofenac 75mg  by mouth two times a day as needed for pain.  5)  Complete all of the antibiotic Prescriptions: CIPROFLOXACIN HCL 500 MG TABS (CIPROFLOXACIN HCL) Take 1 tablet by mouth two times a day for 3 days  #6 x 0   Entered and Authorized by:   [name] MD   Signed by:   [name] MD on 12/09/2009   Method used:   Electronically to        [facility] [phone]* (retail)       [address] [place], [place]  [address]       Ph: [phone]       Fax: [phone]   RxID:   [id number] FLEXERIL 10 MG TABS (CYCLOBENZAPRINE HCL) one by mouth three times a day   as needed for muscle spasm.  #30 x 0   Entered and Authorized by:   [name] MD   Signed by:   [name] MD on 12/09/2009   Method used:   Electronically to        [facility] [phone]* (retail)       [address] [place], [place]  [address]       Ph: [phone]       Fax: [phone]   RxID:   [id number] DICLOFENAC SODIUM 75 MG TBEC (DICLOFENAC SODIUM) Take 1 tablet by mouth two times a day  #40 x 0   Entered and Authorized by:   [name] MD   Signed by:   [name] MD on 12/09/2009   Method used:   Electronically to        [facility] [phone]* (retail)       [address] [place], [place]  [address]       Ph: [phone]       Fax: [phone]   RxID:   [id number]   Laboratory Results   Urine Tests  Date/Time Received: 12/09/2009 Date/Time Reported: 12/09/2009  Routine Urinalysis   Color: yellow Appearance: Clear Glucose: negative   (Normal Range: Negative) Bilirubin: negative    (Normal Range: Negative) Ketone: negative   (Normal Range: Negative) Spec. Gravity: 1.010   (Normal Range: 1.003-1.035) Blood: small   (Normal Range: Negative) pH: 6.0   (Normal Range:  5.0-8.0) Protein: negative   (Normal Range: Negative) Urobilinogen: 0.2   (Normal Range: 0-1) Nitrite: negative   (Normal Range: Negative) Leukocyte Esterace: trace   (Normal Range: Negative)

## 2010-09-04 NOTE — Assessment & Plan Note (Signed)
Summary: f/u - jr   Vital Signs:  Patient profile:   77 year old female Height:      63 inches Weight:      132 pounds Pulse rate:   80 / minute BP sitting:   152 / 73  (left arm) Cuff size:   regular  Vitals Entered By: [name] (January 16, 2010 2:19 PM) CC: discuss CT scan of kidneys   Primary Care Provider:  [name], M.D.  CC:  discuss CT scan of kidneys.  History of Present Illness: Erika Lynch is a 77 year old female who presents today for follow up of her Fluoroquinolone sensitive Enterococcus UTI.  Last visit she underwent CT of the abdomen and pelvis due to flank pain.  This revealed non-obstructing renal calculi and diverticulosis. She completed Cipro Rx.  Since completing Cipro she notes resolution of her back pain and improvement in her urinary frequency.  Denies fevers, notes improvement in her energy.  Current Medications (verified): 1)  Metoprolol Tartrate 50 Mg Tabs (Metoprolol Tartrate) .... Take 1 Tablet By Mouth Two Times A Day 2)  Prevacid 30 Mg Cpdr (Lansoprazole) .Marland Kitchen.. 1 By Mouth Daily 3)  Centrum Silver  Chew (Multiple Vitamins-Minerals) .... By Mouth Daily 4)  B-12 250 Mcg Tabs (Cyanocobalamin) .... By Mouth Daily 5)  Alendronate Sodium 70 Mg Tabs (Alendronate Sodium) .... One By Mouth Once A Week. 6)  Diclofenac Sodium 75 Mg Tbec (Diclofenac Sodium) .... Take 1 Tablet By Mouth Two Times A Day 7)  Flexeril 10 Mg Tabs (Cyclobenzaprine Hcl) .... One By Mouth Three Times A Day   As Needed For Muscle Spasm.  Allergies (verified): 1)  ! Pcn 2)  ! Sulfa 3)  ! Aspirin 4)  ! Tofranil (Imipramine Hcl) 5)  ! Cisapride (Cisapride)  Comments:  Nurse/Medical Assistant: The patient's medications and allergies were reviewed with the patient and were updated in the Medication and Allergy Lists. [name] (January 16, 2010 2:20 PM)  Physical Exam  General:  Well-developed,well-nourished,in no acute distress; alert,appropriate and cooperative throughout  examination Lungs:  Normal respiratory effort, chest expands symmetrically. Lungs are clear to auscultation, no crackles or wheezes. Heart:  Normal rate and regular rhythm. S1 and S2 normal without gallop, murmur, click, rub or other extra sounds.   Impression & Recommendations:  Problem # 1:  UTI (ICD-599.0) Assessment Improved Resolved. The following medications were removed from the medication list:    Cipro 500 Mg Tabs (Ciprofloxacin hcl) ..... One tablet by mouth two times a day x 3 days  Problem # 2:  HYPERTENSION, BENIGN (ICD-401.1) BP is elevated, will add norvasc, plan for pt to follow up in 1 month Her updated medication list for this problem includes:    Metoprolol Tartrate 50 Mg Tabs (Metoprolol tartrate) .Marland Kitchen... Take 1 tablet by mouth two times a day    Norvasc 5 Mg Tabs (Amlodipine besylate) ..... One tablet by mouth daily  Orders: T-Basic Metabolic Panel [id number])  BP today: 152/73 Prior BP: 146/72 (01/02/2010)  Prior 10 Yr Risk Heart Disease: 15 % (12/26/2008)  Labs Reviewed: K+: 4.4 (08/16/2009) Creat: : 1.02 (08/16/2009)   Chol: 177 (12/04/2008)   HDL: 46 (12/04/2008)   LDL: 115 (12/04/2008)   TG: 81 (12/04/2008)  Complete Medication List: 1)  Metoprolol Tartrate 50 Mg Tabs (Metoprolol tartrate) .... Take 1 tablet by mouth two times a day 2)  Prevacid 30 Mg Cpdr (Lansoprazole) .Marland Kitchen.. 1 by mouth daily 3)  Centrum Silver Chew (Multiple vitamins-minerals) .Marland KitchenMarland KitchenMarland Kitchen  By mouth daily 4)  B-12 250 Mcg Tabs (Cyanocobalamin) .... By mouth daily 5)  Alendronate Sodium 70 Mg Tabs (Alendronate sodium) .... One by mouth once a week. 6)  Diclofenac Sodium 75 Mg Tbec (Diclofenac sodium) .... Take 1 tablet by mouth two times a day 7)  Flexeril 10 Mg Tabs (Cyclobenzaprine hcl) .... One by mouth three times a day   as needed for muscle spasm. 8)  Norvasc 5 Mg Tabs (Amlodipine besylate) .... One tablet by mouth daily  Patient Instructions: 1)  Complete blood work downstairs. 2)   Check blood pressure once a day until your follow up appointment.  Call if blood pressure is less than 110/80 or greater than 140/90 after starting norvasc (amlodipine). 3)  Follow up in 1 month. 4)  Glad you are feeling better. Prescriptions: NORVASC 5 MG TABS (AMLODIPINE BESYLATE) one tablet by mouth daily  #30 x 1   Entered and Authorized by:   Lemont Fillers FNP   Signed by:   Lemont Fillers FNP on 01/16/2010   Method used:   Electronically to        [facility] [phone]* (retail)       [address] [place], [place]  [address]       Ph: [phone]       Fax: [phone]   RxID:   [id number]

## 2010-09-04 NOTE — Assessment & Plan Note (Signed)
Summary: URI, HTN   Vital Signs:  Patient profile:   77 year old female Height:      63 inches Weight:      122 pounds O2 Sat:      99 % on Room air Temp:     97.7 degrees F oral Pulse rate:   63 / minute BP sitting:   135 / 75  (left arm) Cuff size:   regular  Vitals Entered By: [name] (August 16, 2009 9:55 AM)  O2 Flow:  Room air CC: last Wednesday started with nasal drainage, cough, sneezing, head congestion   Primary Care Provider:  [name], M.D.  CC:  last Wednesday started with nasal drainage, cough, sneezing, and head congestion.  History of Present Illness: last Wednesday started with nasal drainage, cough, sneezing, head congestion. Sxs for 9 days. Feels  alot better but still having some white nasal drinage. No fever.  Had chills initally. Took Tylenol.  No known sick contacts.   Current Medications (verified): 1)  Metoprolol Tartrate 50 Mg Tabs (Metoprolol Tartrate) .... Take 1 Tablet By Mouth Two Times A Day 2)  Prevacid 30 Mg Cpdr (Lansoprazole) .Marland Kitchen.. 1 By Mouth Daily 3)  Centrum Silver  Chew (Multiple Vitamins-Minerals) .... By Mouth Daily 4)  B-12 250 Mcg Tabs (Cyanocobalamin) .... By Mouth Daily 5)  Alendronate Sodium 70 Mg Tabs (Alendronate Sodium) .... One By Mouth Once A Week.  Allergies (verified): 1)  ! Pcn  Comments:  Nurse/Medical Assistant: The patient's medications and allergies were reviewed with the patient and were updated in the Medication and Allergy Lists. [name] (August 16, 2009 9:56 AM)  Physical Exam  General:  Well-developed,well-nourished,in no acute distress; alert,appropriate and cooperative throughout examination Head:  Normocephalic and atraumatic without obvious abnormalities. No apparent alopecia or balding. Eyes:  No corneal or conjunctival inflammation noted. EOMI. Perrla.  Ears:  External ear exam shows no significant lesions or deformities.  Otoscopic examination reveals clear canals, tympanic  membranes are intact bilaterally without bulging, retraction, inflammation or discharge. Hearing is grossly normal bilaterally. Nose:  External nasal examination shows no deformity or inflammation. Nasal mucosa are pink and moist without lesions or exudates. Mouth:  Oral mucosa and oropharynx without lesions or exudates.  Teeth in good repair. Neck:  No deformities, masses, or tenderness noted. Lungs:  Normal respiratory effort, chest expands symmetrically. Lungs are clear to auscultation, no crackles or wheezes. Heart:  Normal rate and regular rhythm. S1 and S2 normal without gallop, murmur, click, rub or other extra sounds. Skin:  no rashes.   Cervical Nodes:  No lymphadenopathy noted Psych:  Cognition and judgment appear intact. Alert and cooperative with normal attention span and concentration. No apparent delusions, illusions, hallucinations   Impression & Recommendations:  Problem # 1:  URI (ICD-465.9)  Instructed on symptomatic treatment. Call if symptoms persist or worsen.  If not better by Monday or Tuesday then call and will Rx an ABX.   Problem # 2:  HYPERTENSION, BENIGN (ICD-401.1) Much improved today on the higher dose.  Fu in 6 months. Due for BMP today.  Her updated medication list for this problem includes:    Metoprolol Tartrate 50 Mg Tabs (Metoprolol tartrate) .Marland Kitchen... Take 1 tablet by mouth two times a day  Orders: T-Basic Metabolic Panel ([id number])  Complete Medication List: 1)  Metoprolol Tartrate 50 Mg Tabs (Metoprolol tartrate) .... Take 1 tablet by mouth two times a day 2)  Prevacid 30 Mg Cpdr (Lansoprazole) .Marland Kitchen.. 1 by mouth  daily 3)  Centrum Silver Chew (Multiple vitamins-minerals) .... By mouth daily 4)  B-12 250 Mcg Tabs (Cyanocobalamin) .... By mouth daily 5)  Alendronate Sodium 70 Mg Tabs (Alendronate sodium) .... One by mouth once a week.

## 2010-09-04 NOTE — Assessment & Plan Note (Signed)
Summary: 1 mo. f/u - jr   Vital Signs:  Patient profile:   77 year old female Height:      63 inches Weight:      130 pounds Pulse rate:   75 / minute BP sitting:   135 / 72  (left arm) Cuff size:   regular  Vitals Entered By: [name] (February 13, 2010 1:03 PM) CC: followup back pain- pt states is better than it was but still hurts. Finished meds given   Primary Care Provider:  [name], M.D.  CC:  followup back pain- pt states is better than it was but still hurts. Finished meds given.  History of Present Illness: Erika Lynch is a 77 year old female who presents today for follow up of her low back pain.  Notes that she was receiving relief with diclofenac and as needed flexeril, however she has run out of meds.  Notes that she continues her PT exercises.  Pain is worse with bending and certain movements.  HTN- she is tolerating BP meds without difficulty.    Current Medications (verified): 1)  Metoprolol Tartrate 50 Mg Tabs (Metoprolol Tartrate) .... Take 1 Tablet By Mouth Two Times A Day 2)  Prevacid 30 Mg Cpdr (Lansoprazole) .Marland Kitchen.. 1 By Mouth Daily 3)  Centrum Silver  Chew (Multiple Vitamins-Minerals) .... By Mouth Daily 4)  B-12 250 Mcg Tabs (Cyanocobalamin) .... By Mouth Daily 5)  Alendronate Sodium 70 Mg Tabs (Alendronate Sodium) .... One By Mouth Once A Week. 6)  Diclofenac Sodium 75 Mg Tbec (Diclofenac Sodium) .... Take 1 Tablet By Mouth Two Times A Day 7)  Flexeril 10 Mg Tabs (Cyclobenzaprine Hcl) .... One By Mouth Three Times A Day   As Needed For Muscle Spasm. 8)  Norvasc 5 Mg Tabs (Amlodipine Besylate) .... One Tablet By Mouth Daily  Allergies (verified): 1)  ! Pcn 2)  ! Sulfa 3)  ! Aspirin 4)  ! Tofranil (Imipramine Hcl) 5)  ! Cisapride (Cisapride)  Comments:  Nurse/Medical Assistant: The patient's medications and allergies were reviewed with the patient and were updated in the Medication and Allergy Lists. [name] (February 13, 2010 1:04  PM)  Physical Exam  General:  Well-developed,well-nourished,in no acute distress; alert,appropriate and cooperative throughout examination Lungs:  Normal respiratory effort, chest expands symmetrically. Lungs are clear to auscultation, no crackles or wheezes. Heart:  Normal rate and regular rhythm. S1 and S2 normal without gallop, murmur, click, rub or other extra sounds. Msk:  + tenderness overlying right lateral lumbar spine  Extremities:  No edema noted   Impression & Recommendations:  Problem # 1:  BACK PAIN, LUMBAR (ICD-724.2) Assessment Unchanged  Pt instructed to continue her PT exercises, refill sent on diclofenac and flexeril.  I did instruct patient that we will not plan long term treatment with flexeril.  She verbalizes understanding. Her updated medication list for this problem includes:    Diclofenac Sodium 75 Mg Tbec (Diclofenac sodium) .Marland Kitchen... Take 1 tablet by mouth two times a day    Flexeril 10 Mg Tabs (Cyclobenzaprine hcl) ..... One by mouth three times a day   as needed for muscle spasm.  Orders: Prescription Created Electronically ([id number])  Problem # 2:  HYPERTENSION, BENIGN (ICD-401.1) Assessment: Improved BP improved, continue Norvasc Her updated medication list for this problem includes:    Metoprolol Tartrate 50 Mg Tabs (Metoprolol tartrate) .Marland Kitchen... Take 1 tablet by mouth two times a day    Norvasc 5 Mg Tabs (Amlodipine besylate) ..... One  tablet by mouth daily  Complete Medication List: 1)  Metoprolol Tartrate 50 Mg Tabs (Metoprolol tartrate) .... Take 1 tablet by mouth two times a day 2)  Prevacid 30 Mg Cpdr (Lansoprazole) .Marland Kitchen.. 1 by mouth daily 3)  Centrum Silver Chew (Multiple vitamins-minerals) .... By mouth daily 4)  B-12 250 Mcg Tabs (Cyanocobalamin) .... By mouth daily 5)  Alendronate Sodium 70 Mg Tabs (Alendronate sodium) .... One by mouth once a week. 6)  Diclofenac Sodium 75 Mg Tbec (Diclofenac sodium) .... Take 1 tablet by mouth two times a day 7)   Flexeril 10 Mg Tabs (Cyclobenzaprine hcl) .... One by mouth three times a day   as needed for muscle spasm. 8)  Norvasc 5 Mg Tabs (Amlodipine besylate) .... One tablet by mouth daily  Patient Instructions: 1)  Please follow up with Dr. Linford Arnold in 3 months. Prescriptions: FLEXERIL 10 MG TABS (CYCLOBENZAPRINE HCL) one by mouth three times a day   as needed for muscle spasm.  #30 x 0   Entered and Authorized by:   Lemont Fillers FNP   Signed by:   Lemont Fillers FNP on 02/13/2010   Method used:   Electronically to        [facility] [phone]* (retail)       [address] [place], [place]  [address]       Ph: [phone]       Fax: [phone]   RxID:   [id number] DICLOFENAC SODIUM 75 MG TBEC (DICLOFENAC SODIUM) Take 1 tablet by mouth two times a day  #40 x 0   Entered and Authorized by:   Lemont Fillers FNP   Signed by:   Lemont Fillers FNP on 02/13/2010   Method used:   Electronically to        [facility] [phone]* (retail)       [address] [place], [place]  [address]       Ph: [phone]       Fax: [phone]   RxID:   [id number]

## 2010-09-04 NOTE — Miscellaneous (Signed)
  Clinical Lists Changes  Observations: Added new observation of ZOSTAVAX: historical (05/07/2010 15:57)      Immunization History:  Zostavax History:    Zostavax # 1:  historical (05/07/2010)

## 2010-09-22 ENCOUNTER — Ambulatory Visit: Payer: Medicare Other | Admitting: Family Medicine

## 2010-09-23 ENCOUNTER — Other Ambulatory Visit: Payer: Self-pay | Admitting: Family Medicine

## 2010-09-23 ENCOUNTER — Encounter: Payer: Self-pay | Admitting: Family Medicine

## 2010-09-23 ENCOUNTER — Ambulatory Visit (INDEPENDENT_AMBULATORY_CARE_PROVIDER_SITE_OTHER): Payer: Medicare Other | Admitting: Family Medicine

## 2010-09-23 ENCOUNTER — Ambulatory Visit
Admission: RE | Admit: 2010-09-23 | Discharge: 2010-09-23 | Disposition: A | Discharge status: Home / Self Care | Payer: Medicare Other | Source: Ambulatory Visit | Attending: Family Medicine | Admitting: Family Medicine

## 2010-09-23 DIAGNOSIS — M542 Cervicalgia: Secondary | ICD-10-CM

## 2010-09-23 DIAGNOSIS — I10 Essential (primary) hypertension: Secondary | ICD-10-CM

## 2010-09-24 ENCOUNTER — Encounter: Payer: Self-pay | Admitting: Family Medicine

## 2010-09-24 LAB — CONVERTED CEMR LAB
ALT: 13 units/L (ref 0–35)
AST: 16 units/L (ref 0–37)
Albumin: 4.4 g/dL (ref 3.5–5.2)
Alkaline Phosphatase: 53 units/L (ref 39–117)
BUN: 14 mg/dL (ref 6–23)
Calcium: 10.5 mg/dL (ref 8.4–10.5)
Chloride: 104 meq/L (ref 96–112)
HDL: 39 mg/dL — ABNORMAL LOW (ref >39)
LDL Cholesterol: 162 mg/dL — ABNORMAL HIGH (ref 0–99)
Potassium: 4.4 meq/L (ref 3.5–5.3)
Sodium: 141 meq/L (ref 135–145)
Total CHOL/HDL Ratio: 6.4
VLDL: 49 mg/dL — ABNORMAL HIGH (ref 0–40)

## 2010-09-26 ENCOUNTER — Telehealth: Payer: Self-pay | Admitting: Family Medicine

## 2010-09-30 NOTE — Assessment & Plan Note (Signed)
Summary: HTN, NEck Pain   Vital Signs:  Patient profile:   77 year old female Height:      63 inches Weight:      135 pounds Pulse rate:   76 / minute BP sitting:   124 / 75  (right arm) Cuff size:   regular  Vitals Entered By: Avon Gully CMA, Duncan Dull) (September 23, 2010 10:44 AM) CC: f/u BP   Primary Care Provider:  [name], M.D.  CC:  f/u BP.  History of Present Illness: Neck is more painful when turns to theh right. Shoulder are some better but the neck is still painful on both sides, Has been dong the home exercises and using Tylenol. I is some better but still occ severe pain.  Says her neck feels stiff when she gets up from laying down. No pain radiating into her arms.   Current Medications (verified): 1)  Metoprolol Tartrate 50 Mg Tabs (Metoprolol Tartrate) .... Take 1 Tablet By Mouth Two Times A Day 2)  Centrum Silver  Chew (Multiple Vitamins-Minerals) .... By Mouth Daily 3)  B-12 250 Mcg Tabs (Cyanocobalamin) .... By Mouth Daily 4)  Norvasc 5 Mg Tabs (Amlodipine Besylate) .... One Tablet By Mouth Daily 5)  Diclofenac Sodium 75 Mg Tbec (Diclofenac Sodium) .... Take 1 Tablet By Mouth Two Times A Day For 7 Days 6)  Flexeril 10 Mg Tabs (Cyclobenzaprine Hcl) .... 1/2 To 1 Tab By Mouth Up To Three Times A Day As Needed Muscle Spasm.  Allergies (verified): 1)  ! Pcn 2)  ! Sulfa 3)  ! Aspirin 4)  ! Tofranil (Imipramine Hcl) 5)  ! Cisapride (Cisapride)  Comments:  Nurse/Medical Assistant: The patient's medications and allergies were reviewed with the patient and were updated in the Medication and Allergy Lists. Avon Gully CMA, Duncan Dull) (September 23, 2010 10:44 AM)  Physical Exam  General:  Well-developed,well-nourished,in no acute distress; alert,appropriate and cooperative throughout examination Head:  Normocephalic and atraumatic without obvious abnormalities. No apparent alopecia or balding. Neck:  No deformities, masses, or tenderness  noted. Lungs:  Normal respiratory effort, chest expands symmetrically. Lungs are clear to auscultation, no crackles or wheezes. Heart:  Normal rate and regular rhythm. S1 and S2 normal without gallop, murmur, click, rub or other extra sounds. Msk:  Tender over the upper cervial spine and the paraspinous muscles.  Normal flexion, exteion, rotation right and left, Dec side bending to the right. Pain with rotation to the left.  Pulses:  Radial 2+ bialt.  Cervical Nodes:  No lymphadenopathy noted Psych:  Cognition and judgment appear intact. Alert and cooperative with normal attention span and concentration. No apparent delusions, illusions, hallucinations   Impression & Recommendations:  Problem # 1:  HYPERTENSION, BENIGN (ICD-401.1) AT goal. Looks fantastic. REviewed things that can elevate BP. She says she doenst need refills today.  Her updated medication list for this problem includes:    Metoprolol Tartrate 50 Mg Tabs (Metoprolol tartrate) .Marland Kitchen... Take 1 tablet by mouth two times a day    Norvasc 5 Mg Tabs (Amlodipine besylate) ..... One tablet by mouth daily  Problem # 2:  NECK PAIN (ICD-723.1)  Discussed dx with the patient.  She is some better with the exercises but still having some sig pain so will get an xray today for further evaluation. Cna use Tyelnol for pain control. Idon't want her on chronic NSAIDs with her hx of dyskinesia of the esophagus.  Her updated medication list for this problem includes:    Diclofenac  Sodium 75 Mg Tbec (Diclofenac sodium) .Marland Kitchen... Take 1 tablet by mouth two times a day for 7 days    Flexeril 10 Mg Tabs (Cyclobenzaprine hcl) .Marland Kitchen... 1/2 to 1 tab by mouth up to three times a day as needed muscle spasm.  Orders: T-DG Cervical Spine 2-3 Views ([id number])  Complete Medication List: 1)  Metoprolol Tartrate 50 Mg Tabs (Metoprolol tartrate) .... Take 1 tablet by mouth two times a day 2)  Centrum Silver Chew (Multiple vitamins-minerals) .... By mouth daily 3)  B-12  250 Mcg Tabs (Cyanocobalamin) .... By mouth daily 4)  Norvasc 5 Mg Tabs (Amlodipine besylate) .... One tablet by mouth daily 5)  Diclofenac Sodium 75 Mg Tbec (Diclofenac sodium) .... Take 1 tablet by mouth two times a day for 7 days 6)  Flexeril 10 Mg Tabs (Cyclobenzaprine hcl) .... 1/2 to 1 tab by mouth up to three times a day as needed muscle spasm.  Other Orders: T-TSH ([id number])  Patient Instructions: 1)  We will call you with the xray results for your neck 2)  Please schedule a follow-up appointment in 6 months for your blood pressure .    Orders Added: 1)  T-TSH [[id number]] 2)  Est. Patient Level III [13086] 3)  T-DG Cervical Spine 2-3 Views [[id number]]

## 2010-10-09 NOTE — Progress Notes (Signed)
Summary: [facility]-Refill Meds  Phone Note Refill Request Call back at Home Phone [phone] Message from:  Patient  Refills Requested: Medication #1:  DICLOFENAC SODIUM 75 MG TBEC Take 1 tablet by mouth two times a day for 7 days   Last Refilled: 08/19/2010  Medication #2:  FLEXERIL 10 MG TABS 1/2 to 1 tab by mouth up to three times a day as needed muscle spasm.   Last Refilled: 08/19/2010 pt left message requesting refill on these meds.  Pt recieved cholesterol med (pravastatin), but is waiting on these two.   Method Requested: Fax to Local Pharmacy Initial call taken by: Francee Piccolo CMA Duncan Dull),  September 26, 2010 5:44 PM    Prescriptions: FLEXERIL 10 MG TABS (CYCLOBENZAPRINE HCL) 1/2 to 1 tab by mouth up to three times a day as needed muscle spasm.  #30 x 0   Entered and Authorized by:   [name] MD   Signed by:   [name] MD on 09/28/2010   Method used:   Electronically to        [facility] [phone]* (retail)       [address] [place], [place]  [address]       Ph: [phone]       Fax: [phone]   RxID:   [id number] DICLOFENAC SODIUM 75 MG TBEC (DICLOFENAC SODIUM) Take 1 tablet by mouth two times a day for 7 days  #14 x 0   Entered and Authorized by:   [name] MD   Signed by:   [name] MD on 09/28/2010   Method used:   Electronically to        [facility] [phone]* (retail)       [address] [place], [place]  [address]       Ph: [phone]       Fax: [phone]   RxID:   [id number]

## 2010-12-16 NOTE — Assessment & Plan Note (Signed)
[facility]                            CARDIOLOGY OFFICE NOTE   Erika Lynch, Erika Lynch                      MRN:          [id number]  DATE:05/30/2008                            DOB:          06-19-1933    Erika Lynch is a 77 year old female without a prior cardiac history  whom I asked to evaluate for dizziness, palpitations, and tachycardia.  Note, she typically does not have dyspnea on exertion, orthopnea, PND,  presyncope, syncope, or exertional chest pain.  There is also no pedal  edema.  She states for the past 2 months, she has had an elevated heart  rate in the mornings.  She feels her heart pounding.  Her heart rate  also has been as high as 123.  These  are not associated with chest pain  or shortness of breath.  This can last all morning and into the  afternoon, but then it resolves.  It occurs 2 times weekly.  She also  has had dizziness since medications were started in March 2009.  She  states that she feels dizzy essentially all day long.  It does get worse  with standing.  Because of the above, we were asked to further evaluate.   ALLERGIES:  She has an allergy to PENICILLIN, SULFA, KANTREX, ASPIRIN,  PROPULSID, and TOFRANIL.   PRESENT MEDICATIONS:  1. Lisinopril/hydrochlorothiazide 20/12.5 mg p.o. daily.  2. Zantac 150 mg p.o. b.i.d.  3. Toprol 25 mg p.o. daily.  4. Vitamin B12.   SOCIAL HISTORY:  She does not smoke nor does she consume alcohol.   FAMILY HISTORY:  Significant for coronary artery disease in her father  and brothers, but it was after age 64.   PAST MEDICAL HISTORY:  Significant for hypertension and hyperlipidemia.  There is no diabetes mellitus.  She has had a recent renal infection.  She has had prior hysterectomy secondary to cancer by her report.  She  has also had back surgery.  She is undergoing an evaluation for  esophageal problems at present.   REVIEW OF SYSTEMS:  She does have occipital headache.  There  is no  fevers, but she does describe chills.  There is no productive cough or  hemoptysis.  There is no dysphagia, odynophagia, melena, or  hematochezia.  She is having choking spells.  This can occur at  anytime.  There is no dysuria or hematuria.  There is no rash or seizure  activity.  There is no orthopnea, PND, or pedal edema.  The remaining  systems are negative.   PHYSICAL EXAMINATION:  VITAL SIGNS:  Today, her blood pressure is 130/78  and her pulse is 74.  She weighs 127 pounds.  GENERAL:  She is well developed and well nourished, in no acute  distress.  She is extremely anxious.  She does not appear to be  depressed.  SKIN:  Warm and dry.  EXTREMITIES:  There is no peripheral clubbing.  BACK:  Normal.  HEENT:  Normal with normal eyelids.  NECK:  Supple with a normal upstroke bilaterally.  I cannot appreciate  bruits.  There  is no jugular venous distention.  I cannot appreciate  thyromegaly.  CHEST:  Clear to auscultation.  Normal expansion.  CARDIOVASCULAR:  Regular rate and rhythm with normal S1 and S2.  There  is no murmurs, rubs, or gallops noted.  ABDOMEN:  Nontender and nondistended.  Positive bowel sounds.  No  hepatosplenomegaly.  No mass appreciated.  There is no abdominal bruit.  She has 2+ femoral pulses bilaterally.  No bruits.  EXTREMITIES:  No edema.  I can palpate no cords.  She has 2+ dorsalis  pedis pulses bilaterally.  NEUROLOGIC:  Grossly intact.   Her electrocardiogram shows a sinus rhythm at a rate of 76.  The axis is  normal.  There are nonspecific ST changes.   DIAGNOSES:  1. Palpitations/tachycardia - The etiology of this is unclear.  I      predominantly encouraged in the morning.  I will check a CBC and a      TSH.  We will also schedule her for cardiac monitor to more fully      assess.  I will see her back in 4 weeks, and we will review this      information.  2. Tachycardia - As per #1.  We will also check an echocardiogram to       evaluate for left ventricular function.  3. Hypertension - She will continue her on present blood pressure      medications.  4. Dizziness - Etiology of this is unclear to me.  Her blood pressure      appears to be normal.  We will ask her to follow up with Dr.      Linford Arnold for further evaluation of this.  5. History of hyperlipidemia.   We will see her back in approximately 4 weeks to review her  echocardiogram, monitor, and blood work.     Madolyn Frieze Jens Som, MD, [facility]  Electronically Signed    BSC/MedQ  DD: 05/30/2008  DT: 05/31/2008  Job #: [id number]

## 2010-12-16 NOTE — Assessment & Plan Note (Signed)
[facility]                            CARDIOLOGY OFFICE NOTE   Erika, Lynch                      MRN:          [id number]  DATE:07/04/2008                            DOB:          12/31/1932    Erika Lynch is a pleasant 77 year old female that I recently saw on  May 30, 2008, with complaints of dizziness, palpitations, and  tachycardia.  We scheduled her to have a hemoglobin which was normal at  14.2.  Her TSH was also normal at 3.585.  She had an echocardiogram that  showed normal LV function.  Finally, she had a CardioNet monitor that  showed sinus tachycardia, but there was no significant arrhythmias.  The  patient does state that she has discontinued her Zantac and feels that  it may have been causing her to choke which caused her symptoms.  However, since I last saw her, she has not had any further problems with  palpitations, dizziness, or tachycardia.  Also note, she denies any  dyspnea on exertion, and there has been no chest pain.   Her medications at present include:  1. Lisinopril HCT 20/12.5 mg p.o. daily.  2. Vitamin B12.  3. Multivitamin.   Her physical exam today shows a blood pressure of 147/83 and her pulse  is 96.  She weighs 127 pounds.  Her HEENT is normal.  Her neck is  supple.  Chest is clear.  Cardiovascular exam reveals a regular rate and  rhythm.  Abdominal exam shows no tenderness.  Extremities show no edema.   DIAGNOSES:  1. Palpitations - Mrs. Raglin has had no further symptoms.  Her TSH,      CBC, and echocardiogram were normal, and her CardioNet monitor      showed no significant arrhythmias.  We will not pursue this      further.  2. Tachycardia - as per above.  3. Hypertension - her blood pressure is mildly elevated.  She will      track this, and her medications can be adjusted by her primary care      physician as needed.  4. History of hyperlipidemia.   We will see her back on an as-needed  basis.     Madolyn Frieze Jens Som, MD, [facility]  Electronically Signed    BSC/MedQ  DD: 07/04/2008  DT: 07/05/2008  Job #: [id number]

## 2011-01-02 ENCOUNTER — Other Ambulatory Visit: Payer: Self-pay | Admitting: Family Medicine

## 2011-01-06 ENCOUNTER — Ambulatory Visit (INDEPENDENT_AMBULATORY_CARE_PROVIDER_SITE_OTHER): Payer: Medicare Other | Admitting: Family Medicine

## 2011-01-06 ENCOUNTER — Encounter: Payer: Self-pay | Admitting: Family Medicine

## 2011-01-06 VITALS — BP 181/86 | HR 71 | Ht 63.5 in | Wt 134.0 lb

## 2011-01-06 DIAGNOSIS — R102 Pelvic and perineal pain: Secondary | ICD-10-CM

## 2011-01-06 DIAGNOSIS — N949 Unspecified condition associated with female genital organs and menstrual cycle: Secondary | ICD-10-CM

## 2011-01-06 DIAGNOSIS — R1013 Epigastric pain: Secondary | ICD-10-CM

## 2011-01-06 DIAGNOSIS — R110 Nausea: Secondary | ICD-10-CM

## 2011-01-06 LAB — POCT URINALYSIS DIPSTICK
Bilirubin, UA: NEGATIVE
Ketones, UA: NEGATIVE
Spec Grav, UA: <=1.005
pH, UA: 6

## 2011-01-06 MED ORDER — OMEPRAZOLE 40 MG PO CPDR: 40.0000 mg | DELAYED_RELEASE_CAPSULE | Freq: Every day | ORAL | Status: DC

## 2011-01-06 NOTE — Progress Notes (Signed)
  Subjective:    Patient ID: Erika Lynch, female    DOB: Dec 20, 1932, 77 y.o.   MRN: [id number]  HPI Had a severe HA about a week ago.  Hurting in the epigastric area since Monday.  Pain radiates into her neck and her abdomen. Now having diarrhea.  No blood in the stool.  No fever. + chills. HA has now resolved.  Still some pressure in the back of the head.  Epigastric pain feels tight. Not worse with eating or not eating.  Tooks TUMS- helped some. No worsening sxs.  Still has her gallbladder.    Review of Systems     Objective:   Physical Exam  Constitutional: She is oriented to person, place, and time. She appears well-developed and well-nourished.  HENT:  Head: Normocephalic and atraumatic.  Eyes: Conjunctivae and EOM are normal. Pupils are equal, round, and reactive to light.  Neck: Neck supple. No thyromegaly present.  Cardiovascular: Normal rate, regular rhythm and normal heart sounds.   Pulmonary/Chest: Effort normal and breath sounds normal.  Abdominal: Soft. Bowel sounds are normal. She exhibits no distension and no mass. There is no tenderness. There is no rebound and no guarding.  Lymphadenopathy:    She has no cervical adenopathy.  Neurological: She is alert and oriented to person, place, and time.  Skin: Skin is warm and dry.  Psychiatric: She has a normal mood and affect.          Assessment & Plan:  Epigastric pain - consider GB vs. Gastritis. Did give her a Gi cocktail and her upper chest sxs felt better. Will start omeprazole for sxs relief. Will check CBC since also have diarrhea to rule out hepatitis, etc.  Also UA + for blood so will send culture.

## 2011-01-07 ENCOUNTER — Encounter: Payer: Self-pay | Admitting: Family Medicine

## 2011-01-07 ENCOUNTER — Telehealth: Payer: Self-pay | Admitting: Family Medicine

## 2011-01-07 LAB — CBC WITH DIFFERENTIAL/PLATELET
Eosinophils Relative: 1 % (ref 0–5)
HCT: 43.1 % (ref 36.0–46.0)
Hemoglobin: 14.4 g/dL (ref 12.0–15.0)
Lymphocytes Relative: 45 % (ref 12–46)
Lymphs Abs: 4.8 10*3/uL — ABNORMAL HIGH (ref 0.7–4.0)
MCV: 95.4 fL (ref 78.0–100.0)
Monocytes Absolute: 0.5 10*3/uL (ref 0.1–1.0)
RBC: 4.52 MIL/uL (ref 3.87–5.11)
WBC: 10.5 10*3/uL (ref 4.0–10.5)

## 2011-01-07 LAB — COMPLETE METABOLIC PANEL WITH GFR
ALT: 13 U/L (ref 0–35)
Albumin: 4.5 g/dL (ref 3.5–5.2)
BUN: 15 mg/dL (ref 6–23)
CO2: 26 mEq/L (ref 19–32)
Calcium: 10.3 mg/dL (ref 8.4–10.5)
Chloride: 104 mEq/L (ref 96–112)
Creat: 1.13 mg/dL — ABNORMAL HIGH (ref 0.50–1.10)
GFR, Est African American: 57 mL/min — ABNORMAL LOW (ref >60)
Potassium: 4.3 mEq/L (ref 3.5–5.3)

## 2011-01-07 NOTE — Telephone Encounter (Signed)
Call pt: CBC is normal . CMP is nl except kidney funciton is up a little but is stable for her.  Recheck kidney function in 6 months.

## 2011-01-08 ENCOUNTER — Telehealth: Payer: Self-pay | Admitting: Family Medicine

## 2011-01-08 NOTE — Telephone Encounter (Signed)
Pt aware of the above  

## 2011-01-08 NOTE — Telephone Encounter (Signed)
Call pt: Urine culture is neg. Is she feeling any better?

## 2011-06-08 ENCOUNTER — Other Ambulatory Visit: Payer: Self-pay | Admitting: Family Medicine

## 2011-07-16 ENCOUNTER — Ambulatory Visit (INDEPENDENT_AMBULATORY_CARE_PROVIDER_SITE_OTHER): Payer: Medicare Other | Admitting: Family Medicine

## 2011-07-16 ENCOUNTER — Encounter: Payer: Self-pay | Admitting: Family Medicine

## 2011-07-16 VITALS — BP 156/85 | HR 65 | Wt 130.0 lb

## 2011-07-16 DIAGNOSIS — M549 Dorsalgia, unspecified: Secondary | ICD-10-CM

## 2011-07-16 DIAGNOSIS — R3129 Other microscopic hematuria: Secondary | ICD-10-CM

## 2011-07-16 LAB — POCT URINALYSIS DIPSTICK
Ketones, UA: NEGATIVE
Nitrite, UA: NEGATIVE
Protein, UA: NEGATIVE
pH, UA: 6.5

## 2011-07-16 MED ORDER — CYCLOBENZAPRINE HCL 5 MG PO TABS: 5.0000 mg | ORAL_TABLET | Freq: Every evening | ORAL | Status: AC | PRN

## 2011-07-16 MED ORDER — KETOROLAC TROMETHAMINE 60 MG/2ML IM SOLN
60.0000 mg | Freq: Once | INTRAMUSCULAR | Status: AC
Start: 2011-07-16 — End: 2011-07-16
  Administered 2011-07-16: 60 mg via INTRAMUSCULAR

## 2011-07-16 MED ORDER — TRAMADOL HCL 50 MG PO TABS: 50.0000 mg | ORAL_TABLET | Freq: Three times a day (TID) | ORAL | Status: AC | PRN

## 2011-07-16 NOTE — Progress Notes (Signed)
Subjective:     Patient ID: Erika Lynch, female   DOB: 1933-04-30, 76 y.o.   MRN: [id number]  HPI Back Pain x 3 week off an on in low back and radiating into both legs.  She says about 6 nights ago pain got worse. She thought she may have passed a kidney stone. No hematuria, pus, or fever. Pain didn't get any better. She sat in chair all day. Only tried tylenol - helps some. Pain 9/10.  + chills.  Prior hx of back pain.  She now feels she has similar muscle pain.  Flexeril has worked wll in the past.  She felt a pressure "where she pees" so she thought she had passed a stone. She also c/o still feels irritated suprapubically. Note, prior history of low back surgery.  Review of Systems     Objective:   Physical Exam  Constitutional: She is oriented to person, place, and time. She appears well-developed and well-nourished.  Musculoskeletal:       Decreased lumbar flexion to about 25. Decreased extension. Normal rotation right and left. Decreased side bending to the left. She also experienced pain on her left lumbar spine with flexion. Patellar reflexes 1+ bilaterally. Upper extremity reflexes 1+ bilaterally. Negative straight-leg raise bilaterally. Hip, knee, ankle strength 5 over 5 bilaterally. She is very tender over the lumbar spine and just above her previous surgical incision.  Neurological: She is alert and oriented to person, place, and time.  Skin: Skin is warm and dry.  Psychiatric: She has a normal mood and affect. Her behavior is normal.       Assessment:     Acute on chronic Back Pain.     Plan:     Back Pain -likely musculoskeletal strain. Its onset she said she had a lot of spasms. Flexeril has been helpful in the past so we will try this again. For pain relief we will also try tramadol. She cannot take NSAIDs because of her GI history. I also gave her handout stretches to start. If she is not making significant progress in the next couple of weeks then please call the  office and we will consider getting an x-ray based on her age. She has no reflux symptoms and has had a prior history of low back pain. It's also possible that she passed a kidney stone but I think this is separate from the back pain she is having today. check UA to rule out UTI. Urinalysis was positive for blood and leukocytes. We will send a culture to evaluate for infection.

## 2011-07-16 NOTE — Patient Instructions (Signed)
Low Back Sprain with Rehab  A sprain is an injury in which a ligament is torn. The ligaments of the lower back are vulnerable to sprains. However, they are strong and require great force to be injured. These ligaments are important for stabilizing the spinal column. Sprains are classified into three categories. Grade 1 sprains cause pain, but the tendon is not lengthened. Grade 2 sprains include a lengthened ligament, due to the ligament being stretched or partially ruptured. With grade 2 sprains there is still function, although the function may be decreased. Grade 3 sprains involve a complete tear of the tendon or muscle, and function is usually impaired. SYMPTOMS   Severe pain in the lower back.   Sometimes, a feeling of a "pop," "snap," or tear, at the time of injury.   Tenderness and sometimes swelling at the injury site.   Uncommonly, bruising (contusion) within 48 hours of injury.   Muscle spasms in the back.  CAUSES  Low back sprains occur when a force is placed on the ligaments that is greater than they can handle. Common causes of injury include:  Performing a stressful act while off-balance.   Repetitive stressful activities that involve movement of the lower back.   Direct hit (trauma) to the lower back.  RISK INCREASES WITH:  Contact sports (football, wrestling).   Collisions (major skiing accidents).   Sports that require throwing or lifting (baseball, weightlifting).   Sports involving twisting of the spine (gymnastics, diving, tennis, golf).   Poor strength and flexibility.   Inadequate protection.   Previous back injury or surgery (especially fusion).  PREVENTION  Wear properly fitted and padded protective equipment.   Warm up and stretch properly before activity.   Allow for adequate recovery between workouts.   Maintain physical fitness:   Strength, flexibility, and endurance.   Cardiovascular fitness.   Maintain a healthy body weight.  PROGNOSIS   If treated properly, low back sprains usually heal with non-surgical treatment. The length of time for healing depends on the severity of the injury.  RELATED COMPLICATIONS   Recurring symptoms, resulting in a chronic problem.   Chronic inflammation and pain in the low back.   Delayed healing or resolution of symptoms, especially if activity is resumed too soon.   Prolonged impairment.   Unstable or arthritic joints of the low back.  TREATMENT  Treatment first involves the use of ice and medicine, to reduce pain and inflammation. The use of strengthening and stretching exercises may help reduce pain with activity. These exercises may be performed at home or with a therapist. Severe injuries may require referral to a therapist for further evaluation and treatment, such as ultrasound. Your caregiver may advise that you wear a back brace or corset, to help reduce pain and discomfort. Often, prolonged bed rest results in greater harm then benefit. Corticosteroid injections may be recommended. However, these should be reserved for the most serious cases. It is important to avoid using your back when lifting objects. At night, sleep on your back on a firm mattress, with a pillow placed under your knees. If non-surgical treatment is unsuccessful, surgery may be needed.  MEDICATION   If pain medicine is needed, nonsteroidal anti-inflammatory medicines (aspirin and ibuprofen), or other minor pain relievers (acetaminophen), are often advised.   Do not take pain medicine for 7 days before surgery.   Prescription pain relievers may be given, if your caregiver thinks they are needed. Use only as directed and only as much as you   need.   Ointments applied to the skin may be helpful.   Corticosteroid injections may be given by your caregiver. These injections should be reserved for the most serious cases, because they may only be given a certain number of times.  HEAT AND COLD  Cold treatment (icing)  should be applied for 10 to 15 minutes every 2 to 3 hours for inflammation and pain, and immediately after activity that aggravates your symptoms. Use ice packs or an ice massage.   Heat treatment may be used before performing stretching and strengthening activities prescribed by your caregiver, physical therapist, or athletic trainer. Use a heat pack or a warm water soak.  SEEK MEDICAL CARE IF:   Symptoms get worse or do not improve in 2 to 4 weeks, despite treatment.   You develop numbness or weakness in either leg.   You lose bowel or bladder function.   Any of the following occur after surgery: fever, increased pain, swelling, redness, drainage of fluids, or bleeding in the affected area.   New, unexplained symptoms develop. (Drugs used in treatment may produce side effects.)  EXERCISES  RANGE OF MOTION (ROM) AND STRETCHING EXERCISES - Low Back Sprain Most people with lower back pain will find that their symptoms get worse with excessive bending forward (flexion) or arching at the lower back (extension). The exercises that will help resolve your symptoms will focus on the opposite motion.  Your physician, physical therapist or athletic trainer will help you determine which exercises will be most helpful to resolve your lower back pain. Do not complete any exercises without first consulting with your caregiver. Discontinue any exercises which make your symptoms worse, until you speak to your caregiver. If you have pain, numbness or tingling which travels down into your buttocks, leg or foot, the goal of the therapy is for these symptoms to move closer to your back and eventually resolve. Sometimes, these leg symptoms will get better, but your lower back pain may worsen. This is often an indication of progress in your rehabilitation. Be very alert to any changes in your symptoms and the activities in which you participated in the 24 hours prior to the change. Sharing this information with your  caregiver will allow him or her to most efficiently treat your condition. These exercises may help you when beginning to rehabilitate your injury. Your symptoms may resolve with or without further involvement from your physician, physical therapist or athletic trainer. While completing these exercises, remember:   Restoring tissue flexibility helps normal motion to return to the joints. This allows healthier, less painful movement and activity.   An effective stretch should be held for at least 30 seconds.   A stretch should never be painful. You should only feel a gentle lengthening or release in the stretched tissue.  FLEXION RANGE OF MOTION AND STRETCHING EXERCISES: STRETCH - Flexion, Single Knee to Chest   Lie on a firm bed or floor with both legs extended in front of you.   Keeping one leg in contact with the floor, bring your opposite knee to your chest. Hold your leg in place by either grabbing behind your thigh or at your knee.   Pull until you feel a gentle stretch in your low back. Hold __________ seconds.   Slowly release your grasp and repeat the exercise with the opposite side.  Repeat __________ times. Complete this exercise __________ times per day.  STRETCH - Flexion, Double Knee to Chest  Lie on a firm bed or   floor with both legs extended in front of you.   Keeping one leg in contact with the floor, bring your opposite knee to your chest.   Tense your stomach muscles to support your back and then lift your other knee to your chest. Hold your legs in place by either grabbing behind your thighs or at your knees.   Pull both knees toward your chest until you feel a gentle stretch in your low back. Hold __________ seconds.   Tense your stomach muscles and slowly return one leg at a time to the floor.  Repeat __________ times. Complete this exercise __________ times per day.  STRETCH - Low Trunk Rotation  Lie on a firm bed or floor. Keeping your legs in front of you, bend  your knees so they are both pointed toward the ceiling and your feet are flat on the floor.   Extend your arms out to the side. This will stabilize your upper body by keeping your shoulders in contact with the floor.   Gently and slowly drop both knees together to one side until you feel a gentle stretch in your low back. Hold for __________ seconds.   Tense your stomach muscles to support your lower back as you bring your knees back to the starting position. Repeat the exercise to the other side.  Repeat __________ times. Complete this exercise __________ times per day  EXTENSION RANGE OF MOTION AND FLEXIBILITY EXERCISES: STRETCH - Extension, Prone on Elbows   Lie on your stomach on the floor, a bed will be too soft. Place your palms about shoulder width apart and at the height of your head.   Place your elbows under your shoulders. If this is too painful, stack pillows under your chest.   Allow your body to relax so that your hips drop lower and make contact more completely with the floor.   Hold this position for __________ seconds.   Slowly return to lying flat on the floor.  Repeat __________ times. Complete this exercise __________ times per day.  RANGE OF MOTION - Extension, Prone Press Ups  Lie on your stomach on the floor, a bed will be too soft. Place your palms about shoulder width apart and at the height of your head.   Keeping your back as relaxed as possible, slowly straighten your elbows while keeping your hips on the floor. You may adjust the placement of your hands to maximize your comfort. As you gain motion, your hands will come more underneath your shoulders.   Hold this position __________ seconds.   Slowly return to lying flat on the floor.  Repeat __________ times. Complete this exercise __________ times per day.  RANGE OF MOTION- Quadruped, Neutral Spine   Assume a hands and knees position on a firm surface. Keep your hands under your shoulders and your knees  under your hips. You may place padding under your knees for comfort.   Drop your head and point your tailbone toward the ground below you. This will round out your lower back like an angry cat. Hold this position for __________ seconds.   Slowly lift your head and release your tail bone so that your back sags into a large arch, like an old horse.   Hold this position for __________ seconds.   Repeat this until you feel limber in your low back.   Now, find your "sweet spot." This will be the most comfortable position somewhere between the two previous positions. This is your neutral spine.   Once you have found this position, tense your stomach muscles to support your low back.   Hold this position for __________ seconds.  Repeat __________ times. Complete this exercise __________ times per day.  STRENGTHENING EXERCISES - Low Back Sprain These exercises may help you when beginning to rehabilitate your injury. These exercises should be done near your "sweet spot." This is the neutral, low-back arch, somewhere between fully rounded and fully arched, that is your least painful position. When performed in this safe range of motion, these exercises can be used for people who have either a flexion or extension based injury. These exercises may resolve your symptoms with or without further involvement from your physician, physical therapist or athletic trainer. While completing these exercises, remember:   Muscles can gain both the endurance and the strength needed for everyday activities through controlled exercises.   Complete these exercises as instructed by your physician, physical therapist or athletic trainer. Increase the resistance and repetitions only as guided.   You may experience muscle soreness or fatigue, but the pain or discomfort you are trying to eliminate should never worsen during these exercises. If this pain does worsen, stop and make certain you are following the directions exactly.  If the pain is still present after adjustments, discontinue the exercise until you can discuss the trouble with your caregiver.  STRENGTHENING - Deep Abdominals, Pelvic Tilt   Lie on a firm bed or floor. Keeping your legs in front of you, bend your knees so they are both pointed toward the ceiling and your feet are flat on the floor.   Tense your lower abdominal muscles to press your low back into the floor. This motion will rotate your pelvis so that your tail bone is scooping upwards rather than pointing at your feet or into the floor.  With a gentle tension and even breathing, hold this position for __________ seconds. Repeat __________ times. Complete this exercise __________ times per day.  STRENGTHENING - Abdominals, Crunches   Lie on a firm bed or floor. Keeping your legs in front of you, bend your knees so they are both pointed toward the ceiling and your feet are flat on the floor. Cross your arms over your chest.   Slightly tip your chin down without bending your neck.   Tense your abdominals and slowly lift your trunk high enough to just clear your shoulder blades. Lifting higher can put excessive stress on the lower back and does not further strengthen your abdominal muscles.   Control your return to the starting position.  Repeat __________ times. Complete this exercise __________ times per day.  STRENGTHENING - Quadruped, Opposite UE/LE Lift   Assume a hands and knees position on a firm surface. Keep your hands under your shoulders and your knees under your hips. You may place padding under your knees for comfort.   Find your neutral spine and gently tense your abdominal muscles so that you can maintain this position. Your shoulders and hips should form a rectangle that is parallel with the floor and is not twisted.   Keeping your trunk steady, lift your right hand no higher than your shoulder and then your left leg no higher than your hip. Make sure you are not holding your  breath. Hold this position for __________ seconds.   Continuing to keep your abdominal muscles tense and your back steady, slowly return to your starting position. Repeat with the opposite arm and leg.  Repeat __________ times. Complete this exercise __________ times   per day.  STRENGTHENING - Abdominals and Quadriceps, Straight Leg Raise   Lie on a firm bed or floor with both legs extended in front of you.   Keeping one leg in contact with the floor, bend the other knee so that your foot can rest flat on the floor.   Find your neutral spine, and tense your abdominal muscles to maintain your spinal position throughout the exercise.   Slowly lift your straight leg off the floor about 6 inches for a count of 15, making sure to not hold your breath.   Still keeping your neutral spine, slowly lower your leg all the way to the floor.  Repeat this exercise with each leg __________ times. Complete this exercise __________ times per day. POSTURE AND BODY MECHANICS CONSIDERATIONS - Low Back Sprain Keeping correct posture when sitting, standing or completing your activities will reduce the stress put on different body tissues, allowing injured tissues a chance to heal and limiting painful experiences. The following are general guidelines for improved posture. Your physician or physical therapist will provide you with any instructions specific to your needs. While reading these guidelines, remember:  The exercises prescribed by your provider will help you have the flexibility and strength to maintain correct postures.   The correct posture provides the best environment for your joints to work. All of your joints have less wear and tear when properly supported by a spine with good posture. This means you will experience a healthier, less painful body.   Correct posture must be practiced with all of your activities, especially prolonged sitting and standing. Correct posture is as important when doing  repetitive low-stress activities (typing) as it is when doing a single heavy-load activity (lifting).  RESTING POSITIONS Consider which positions are most painful for you when choosing a resting position. If you have pain with flexion-based activities (sitting, bending, stooping, squatting), choose a position that allows you to rest in a less flexed posture. You would want to avoid curling into a fetal position on your side. If your pain worsens with extension-based activities (prolonged standing, working overhead), avoid resting in an extended position such as sleeping on your stomach. Most people will find more comfort when they rest with their spine in a more neutral position, neither too rounded nor too arched. Lying on a non-sagging bed on your side with a pillow between your knees, or on your back with a pillow under your knees will often provide some relief. Keep in mind, being in any one position for a prolonged period of time, no matter how correct your posture, can still lead to stiffness. PROPER SITTING POSTURE In order to minimize stress and discomfort on your spine, you must sit with correct posture. Sitting with good posture should be effortless for a healthy body. Returning to good posture is a gradual process. Many people can work toward this most comfortably by using various supports until they have the flexibility and strength to maintain this posture on their own. When sitting with proper posture, your ears will fall over your shoulders and your shoulders will fall over your hips. You should use the back of the chair to support your upper back. Your lower back will be in a neutral position, just slightly arched. You may place a small pillow or folded towel at the base of your lower back for  support.  When working at a desk, create an environment that supports good, upright posture. Without extra support, muscles tire, which leads to excessive   strain on joints and other tissues. Keep these  recommendations in mind: CHAIR:  A chair should be able to slide under your desk when your back makes contact with the back of the chair. This allows you to work closely.   The chair's height should allow your eyes to be level with the upper part of your monitor and your hands to be slightly lower than your elbows.  BODY POSITION  Your feet should make contact with the floor. If this is not possible, use a foot rest.   Keep your ears over your shoulders. This will reduce stress on your neck and low back.  INCORRECT SITTING POSTURES  If you are feeling tired and unable to assume a healthy sitting posture, do not slouch or slump. This puts excessive strain on your back tissues, causing more damage and pain. Healthier options include:  Using more support, like a lumbar pillow.   Switching tasks to something that requires you to be upright or walking.   Talking a brief walk.   Lying down to rest in a neutral-spine position.  PROLONGED STANDING WHILE SLIGHTLY LEANING FORWARD  When completing a task that requires you to lean forward while standing in one place for a long time, place either foot up on a stationary 2-4 inch high object to help maintain the best posture. When both feet are on the ground, the lower back tends to lose its slight inward curve. If this curve flattens (or becomes too large), then the back and your other joints will experience too much stress, tire more quickly, and can cause pain. CORRECT STANDING POSTURES Proper standing posture should be assumed with all daily activities, even if they only take a few moments, like when brushing your teeth. As in sitting, your ears should fall over your shoulders and your shoulders should fall over your hips. You should keep a slight tension in your abdominal muscles to brace your spine. Your tailbone should point down to the ground, not behind your body, resulting in an over-extended swayback posture.  INCORRECT STANDING POSTURES    Common incorrect standing postures include a forward head, locked knees and/or an excessive swayback. WALKING Walk with an upright posture. Your ears, shoulders and hips should all line-up. PROLONGED ACTIVITY IN A FLEXED POSITION When completing a task that requires you to bend forward at your waist or lean over a low surface, try to find a way to stabilize 3 out of 4 of your limbs. You can place a hand or elbow on your thigh or rest a knee on the surface you are reaching across. This will provide you more stability, so that your muscles do not tire as quickly. By keeping your knees relaxed, or slightly bent, you will also reduce stress across your lower back. CORRECT LIFTING TECHNIQUES DO :  Assume a wide stance. This will provide you more stability and the opportunity to get as close as possible to the object which you are lifting.   Tense your abdominals to brace your spine. Bend at the knees and hips. Keeping your back locked in a neutral-spine position, lift using your leg muscles. Lift with your legs, keeping your back straight.   Test the weight of unknown objects before attempting to lift them.   Try to keep your elbows locked down at your sides in order get the best strength from your shoulders when carrying an object.   Always ask for help when lifting heavy or awkward objects.  INCORRECT LIFTING TECHNIQUES DO   NOT:   Lock your knees when lifting, even if it is a small object.   Bend and twist. Pivot at your feet or move your feet when needing to change directions.   Assume that you can safely pick up even a paperclip without proper posture.  Document Released: 07/20/2005 Document Revised: 04/01/2011 Document Reviewed: 11/01/2008 [employer] Patient Information 2012 [employer]. 

## 2011-07-19 LAB — URINE CULTURE

## 2011-08-06 ENCOUNTER — Ambulatory Visit
Admission: RE | Admit: 2011-08-06 | Discharge: 2011-08-06 | Disposition: A | Discharge status: Home / Self Care | Payer: Medicare Other | Source: Ambulatory Visit | Attending: Family Medicine | Admitting: Family Medicine

## 2011-08-06 ENCOUNTER — Encounter: Payer: Self-pay | Admitting: Family Medicine

## 2011-08-06 ENCOUNTER — Ambulatory Visit (INDEPENDENT_AMBULATORY_CARE_PROVIDER_SITE_OTHER): Payer: Medicare Other | Admitting: Family Medicine

## 2011-08-06 VITALS — BP 149/72 | HR 84 | Wt 132.0 lb

## 2011-08-06 DIAGNOSIS — M545 Low back pain: Secondary | ICD-10-CM

## 2011-08-06 DIAGNOSIS — M546 Pain in thoracic spine: Secondary | ICD-10-CM

## 2011-08-06 NOTE — Progress Notes (Signed)
  Subjective:    Patient ID: Erika Lynch, female    DOB: 1932-10-12, 78 y.o.   MRN: [id number]  HPI Still low back pain. Radiates down both legs with waling. Worse with sitting and standing. Waking her up at night.  Muscle relaxer and the tramadol helps some. Was a little better yesterday.  No fever.  Had chills.  No blood in the urine. Urine cx was neg. Has been doing the home exercises. Some better. She was able to finally mopped her floor yesterday which is the first active thing she's really been able to over the last 3 weeks. She feels some worse again today.  Wok up yesterday witih vertigo.  This is a recurrent problem.  Review of Systems     Objective:   Physical Exam  Constitutional: She is oriented to person, place, and time. She appears well-developed and well-nourished.  HENT:  Head: Normocephalic and atraumatic.  Pulmonary/Chest: Effort normal.  Musculoskeletal:       Normal extension, rotation right and left and side bending.  Dec flexion to about 35 degrees.  Neg straight leg raise, Hip, knee, and ankle strength 5/5 bilat.  Patellar reflexes 2+ bilat.   Neurological: She is alert and oriented to person, place, and time.  Skin: Skin is warm and dry.  Psychiatric: She has a normal mood and affect. Her behavior is normal.          Assessment & Plan:  Low Back pain- it does sound like she's had some very mild improvement but it certainly has not been significant. At this time I would like to get x-rays because of her age and her persisting pain. If these are fairly normal or just show some arthritis but I recommend physical therapy to continue to work with her and try to help her improve her mobility and decrease her pain level. She can continue the muscle ask her in the tramadol since it seems to be helping. Continue home exercises for now since they seem to be helping as well.  Encouraged her to schedule an annual wellness exam.

## 2011-08-06 NOTE — Patient Instructions (Signed)
We will call you with your xray results. If you don't here from [place] in about a week then please give [place] a call at [phone].

## 2011-08-07 ENCOUNTER — Telehealth: Payer: Self-pay | Admitting: *Deleted

## 2011-08-07 ENCOUNTER — Other Ambulatory Visit: Payer: Self-pay | Admitting: Family Medicine

## 2011-08-07 DIAGNOSIS — M545 Low back pain: Secondary | ICD-10-CM

## 2011-08-07 DIAGNOSIS — M5136 Other intervertebral disc degeneration, lumbar region: Secondary | ICD-10-CM

## 2011-08-07 DIAGNOSIS — M479 Spondylosis, unspecified: Secondary | ICD-10-CM

## 2011-08-07 NOTE — Telephone Encounter (Signed)
Message copied by Lanae Crumbly on Fri Aug 07, 2011  1:45 PM ------      Message from: [name] D      Created: Fri Aug 07, 2011 12:35 PM       Is she home bound?  i really don't remember if she drives or not. If so then ok for [facility] refer for PT for her back.

## 2011-08-07 NOTE — Telephone Encounter (Signed)
Pt has to depend on sister to drive her.  MA, LPN

## 2011-09-06 ENCOUNTER — Other Ambulatory Visit: Payer: Self-pay | Admitting: Family Medicine

## 2011-12-07 ENCOUNTER — Other Ambulatory Visit: Payer: Self-pay | Admitting: Family Medicine

## 2012-01-04 ENCOUNTER — Other Ambulatory Visit: Payer: Self-pay | Admitting: Family Medicine

## 2012-05-02 ENCOUNTER — Other Ambulatory Visit: Payer: Self-pay | Admitting: Family Medicine

## 2012-06-03 ENCOUNTER — Ambulatory Visit (INDEPENDENT_AMBULATORY_CARE_PROVIDER_SITE_OTHER): Payer: Medicare Other | Admitting: Family Medicine

## 2012-06-03 ENCOUNTER — Encounter: Payer: Self-pay | Admitting: *Deleted

## 2012-06-03 ENCOUNTER — Encounter: Payer: Self-pay | Admitting: Family Medicine

## 2012-06-03 VITALS — BP 141/74 | HR 64 | Ht 63.5 in | Wt 126.0 lb

## 2012-06-03 DIAGNOSIS — M545 Low back pain, unspecified: Secondary | ICD-10-CM

## 2012-06-03 DIAGNOSIS — I10 Essential (primary) hypertension: Secondary | ICD-10-CM

## 2012-06-03 DIAGNOSIS — M542 Cervicalgia: Secondary | ICD-10-CM

## 2012-06-03 DIAGNOSIS — E538 Deficiency of other specified B group vitamins: Secondary | ICD-10-CM

## 2012-06-03 DIAGNOSIS — G8929 Other chronic pain: Secondary | ICD-10-CM

## 2012-06-03 DIAGNOSIS — K219 Gastro-esophageal reflux disease without esophagitis: Secondary | ICD-10-CM

## 2012-06-03 MED ORDER — TRAMADOL HCL 50 MG PO TABS: 50.0000 mg | ORAL_TABLET | Freq: Three times a day (TID) | ORAL | Status: DC | PRN

## 2012-06-03 MED ORDER — AMLODIPINE BESYLATE 2.5 MG PO TABS: 2.5000 mg | ORAL_TABLET | Freq: Every day | ORAL | Status: DC

## 2012-06-03 MED ORDER — METOPROLOL TARTRATE 50 MG PO TABS: 50.0000 mg | ORAL_TABLET | Freq: Two times a day (BID) | ORAL | Status: DC

## 2012-06-03 MED ORDER — CYCLOBENZAPRINE HCL 5 MG PO TABS: 5.0000 mg | ORAL_TABLET | Freq: Every evening | ORAL | Status: DC | PRN

## 2012-06-03 MED ORDER — OMEPRAZOLE 40 MG PO CPDR: 40.0000 mg | DELAYED_RELEASE_CAPSULE | Freq: Every day | ORAL | Status: DC

## 2012-06-03 NOTE — Progress Notes (Signed)
  Subjective:    Patient ID: JA PISTOLE, female    DOB: 1932-09-20, 78 y.o.   MRN: [id number]  HPI She's here today to followup for hypertension. No chest pain or short of breath. She's taking her medications regularly. Amlodipine was on her med list but she was not taking it. She said the pharmacy never had the prescription she then asked him specifically about it. She does get some exercise. She eats well. Refills sent to the pharmacy.  She needs a letter stating that she takes vitamin B12, her Centrum Silver, and her Tylenol for arthritis.  She is still having a lot of neck and low back pain. We addressed this about a year ago and even got x-rays. She did have some degenerative disc disease as well as some arthritis. We had decided to do physical therapy. It was helpful but only to a mild degree. She says she has continued to do the exercises. She just to use muscle relaxer only as needed. She says she has to sleep on 3 pillows to support her upper back and neck that it doesn't hurt. She says the suprapatellar in her low back. She is getting pain radiating down to both legs and both upper anterior thighs.  GERD - Uses her prilosec prn.  Does need refills.   Review of Systems     Objective:   Physical Exam  Constitutional: She is oriented to person, place, and time. She appears well-developed and well-nourished.  HENT:  Head: Normocephalic and atraumatic.  Eyes: Conjunctivae normal are normal. Pupils are equal, round, and reactive to light.  Neck: Neck supple. No thyromegaly present.  Cardiovascular: Normal rate, regular rhythm and normal heart sounds.        No carotid bruits.   Pulmonary/Chest: Effort normal and breath sounds normal.  Musculoskeletal: She exhibits no edema.       Neck with normal flexion, decreased extension, decreased rotation left and right. She had more pain turning left. Normal side bending right and left. She is tender over the lower cervical spine. She's also  mildly tender over the lower lumbar spine. No SI joint tenderness. Hip, knee, ankle strength is 5 out of 5 bilaterally. Patellar reflexes are 1+ bilaterally.  Lymphadenopathy:    She has no cervical adenopathy.  Neurological: She is alert and oriented to person, place, and time.  Skin: Skin is warm and dry.  Psychiatric: She has a normal mood and affect. Her behavior is normal.          Assessment & Plan:  Hypertension-uncontrolled. We will add 2.5 of amlodipine daily. Followup in 2 months for repeat blood pressure check.  Cervical pain-Will get MRI since still with sig pain and is doing her home exercises post PT.  She is only using tylenol for pain control.  Will tray tramadol prn for relief as well.  Using sparingly.    Low back pain--Will get MRI since still with sig pain and is doing her home exercises post PT.  She is only using tylenol for pain control.  Will tray tramadol prn for relief as well.  Using sparingly.   GERD- refilled her prilosec.

## 2012-06-03 NOTE — Patient Instructions (Addendum)
We will call you with MRI appointment and then we can discuss your results.

## 2012-06-04 LAB — LIPID PANEL
HDL: 41 mg/dL (ref >39)
Total CHOL/HDL Ratio: 5.5 Ratio
VLDL: 39 mg/dL (ref 0–40)

## 2012-06-04 LAB — VITAMIN B12: Vitamin B-12: 314 pg/mL (ref 211–911)

## 2012-06-04 LAB — COMPLETE METABOLIC PANEL WITH GFR
ALT: 9 U/L (ref 0–35)
AST: 18 U/L (ref 0–37)
Albumin: 4.5 g/dL (ref 3.5–5.2)
Alkaline Phosphatase: 56 U/L (ref 39–117)
Potassium: 4.9 mEq/L (ref 3.5–5.3)
Sodium: 139 mEq/L (ref 135–145)
Total Bilirubin: 0.4 mg/dL (ref 0.3–1.2)
Total Protein: 7.1 g/dL (ref 6.0–8.3)

## 2012-06-04 LAB — TSH: TSH: 2.445 u[IU]/mL (ref 0.350–4.500)

## 2012-06-07 ENCOUNTER — Ambulatory Visit (HOSPITAL_BASED_OUTPATIENT_CLINIC_OR_DEPARTMENT_OTHER): Payer: Medicare Other

## 2012-06-07 ENCOUNTER — Telehealth: Payer: Self-pay | Admitting: *Deleted

## 2012-06-07 NOTE — Telephone Encounter (Signed)
Prior auth obtained for MRI lumbar and cervical

## 2012-06-11 ENCOUNTER — Ambulatory Visit (HOSPITAL_BASED_OUTPATIENT_CLINIC_OR_DEPARTMENT_OTHER)
Admission: RE | Admit: 2012-06-11 | Discharge: 2012-06-11 | Disposition: A | Discharge status: Home / Self Care | Payer: Medicare Other | Source: Ambulatory Visit | Attending: Family Medicine | Admitting: Family Medicine

## 2012-06-11 DIAGNOSIS — M47812 Spondylosis without myelopathy or radiculopathy, cervical region: Secondary | ICD-10-CM | POA: Insufficient documentation

## 2012-06-11 DIAGNOSIS — M545 Low back pain: Secondary | ICD-10-CM

## 2012-06-11 DIAGNOSIS — M542 Cervicalgia: Secondary | ICD-10-CM

## 2012-06-11 DIAGNOSIS — M5030 Other cervical disc degeneration, unspecified cervical region: Secondary | ICD-10-CM | POA: Insufficient documentation

## 2012-06-13 ENCOUNTER — Other Ambulatory Visit: Payer: Self-pay | Admitting: Family Medicine

## 2012-06-13 DIAGNOSIS — M5090 Cervical disc disorder, unspecified, unspecified cervical region: Secondary | ICD-10-CM

## 2012-06-13 DIAGNOSIS — M4800 Spinal stenosis, site unspecified: Secondary | ICD-10-CM

## 2012-06-13 DIAGNOSIS — M48061 Spinal stenosis, lumbar region without neurogenic claudication: Secondary | ICD-10-CM | POA: Insufficient documentation

## 2012-11-28 ENCOUNTER — Ambulatory Visit (INDEPENDENT_AMBULATORY_CARE_PROVIDER_SITE_OTHER): Payer: Medicare Other | Admitting: Physician Assistant

## 2012-11-28 ENCOUNTER — Encounter: Payer: Self-pay | Admitting: Physician Assistant

## 2012-11-28 ENCOUNTER — Other Ambulatory Visit: Payer: Self-pay | Admitting: Family Medicine

## 2012-11-28 VITALS — BP 148/87 | HR 82 | Wt 127.0 lb

## 2012-11-28 DIAGNOSIS — I10 Essential (primary) hypertension: Secondary | ICD-10-CM

## 2012-11-28 DIAGNOSIS — Z9109 Other allergy status, other than to drugs and biological substances: Secondary | ICD-10-CM

## 2012-11-28 DIAGNOSIS — R42 Dizziness and giddiness: Secondary | ICD-10-CM

## 2012-11-28 DIAGNOSIS — J301 Allergic rhinitis due to pollen: Secondary | ICD-10-CM

## 2012-11-28 MED ORDER — AMLODIPINE BESYLATE 5 MG PO TABS: 5.0000 mg | ORAL_TABLET | Freq: Every day | ORAL | Status: DC

## 2012-11-28 MED ORDER — CETIRIZINE HCL 10 MG PO TABS: 10.0000 mg | ORAL_TABLET | Freq: Every day | ORAL | Status: DC

## 2012-11-28 MED ORDER — FLUTICASONE PROPIONATE 50 MCG/ACT NA SUSP: 2.0000 | Freq: Every day | NASAL | Status: DC

## 2012-11-28 NOTE — Progress Notes (Signed)
  Subjective:    Patient ID: Erika Lynch, female    DOB: 03-18-1933, 79 y.o.   MRN: [id number]  HPI Patient presents to the clinic to follow up on hypertension and to discuss dizziness.   HTN- Dr. Linford Arnold added norvasc at last visit. She tolerated well. No CP, palpations, SOB, Headaches. She has not been checking BP. Denies any excessive salt intake in diet. Staying active at home doing things.   She has a lot of dizziness for the last 2 days. Off and on symptoms. No vision changes. Feels swimy in head. Worse at night. No fainting or passing out. No black spots. Not worse with movement.  No fever, chills, sinus pressure, ear pain, ST, cough, SOB, wheezing, n/v/d. Her ears do feel a little itchy and her eyes have been watering. No nasal drainage. Not tried anything.    Review of Systems     Objective:   Physical Exam  Constitutional: She is oriented to person, place, and time. She appears well-developed and well-nourished.  HENT:  Head: Normocephalic and atraumatic.  Right Ear: External ear normal.  Left Ear: External ear normal.  Nose: Nose normal.  Mouth/Throat: Oropharynx is clear and moist.  Eyes: Conjunctivae are normal.  Clear watery discharge.  Neck: Normal range of motion. Neck supple.  Cardiovascular: Normal rate, regular rhythm and normal heart sounds.   Pulmonary/Chest: Effort normal and breath sounds normal. She has no wheezes.  Lymphadenopathy:    She has no cervical adenopathy.  Neurological: She is alert and oriented to person, place, and time.  Skin: Skin is warm and dry.  Psychiatric: She has a normal mood and affect. Her behavior is normal.          Assessment & Plan:  HTN- Will increase norvasc to 5mg . Continue on metoprolol. Follow up in 2 months.   Dizziness- I really think some type of inner ear dysfunction possibly due to allergies.ETD is also possible. Let's try flonase, zyrtec daily and see if makes better. Follow up if not improving.

## 2012-11-28 NOTE — Patient Instructions (Addendum)
Zyrtec/Claritin daily for the next 4-6 weeks. Nasal spray to use once a day 2 sprays each nostril.   Increase norvasc to 5mg  daily. Follow up in 2 months.

## 2012-11-28 NOTE — Telephone Encounter (Signed)
Needs BP f/u appt before future refills

## 2012-12-30 ENCOUNTER — Other Ambulatory Visit: Payer: Self-pay | Admitting: Family Medicine

## 2013-01-02 ENCOUNTER — Ambulatory Visit (INDEPENDENT_AMBULATORY_CARE_PROVIDER_SITE_OTHER): Payer: Medicare Other | Admitting: Physician Assistant

## 2013-01-02 ENCOUNTER — Encounter: Payer: Self-pay | Admitting: Physician Assistant

## 2013-01-02 VITALS — BP 121/72 | HR 68 | Wt 130.0 lb

## 2013-01-02 DIAGNOSIS — R3915 Urgency of urination: Secondary | ICD-10-CM

## 2013-01-02 DIAGNOSIS — N390 Urinary tract infection, site not specified: Secondary | ICD-10-CM

## 2013-01-02 LAB — POCT URINALYSIS DIPSTICK
Nitrite, UA: NEGATIVE
Spec Grav, UA: 1.015
Urobilinogen, UA: 0.2
pH, UA: 6.5

## 2013-01-02 MED ORDER — CIPROFLOXACIN HCL 500 MG PO TABS: 500.0000 mg | ORAL_TABLET | Freq: Two times a day (BID) | ORAL | Status: DC

## 2013-01-02 NOTE — Patient Instructions (Signed)
Urinary Tract Infection  Urinary tract infections (UTIs) can develop anywhere along your urinary tract. Your urinary tract is your body's drainage system for removing wastes and extra water. Your urinary tract includes two kidneys, two ureters, a bladder, and a urethra. Your kidneys are a pair of bean-shaped organs. Each kidney is about the size of your fist. They are located below your ribs, one on each side of your spine.  CAUSES  Infections are caused by microbes, which are microscopic organisms, including fungi, viruses, and bacteria. These organisms are so small that they can only be seen through a microscope. Bacteria are the microbes that most commonly cause UTIs.  SYMPTOMS   Symptoms of UTIs may vary by age and gender of the patient and by the location of the infection. Symptoms in young women typically include a frequent and intense urge to urinate and a painful, burning feeling in the bladder or urethra during urination. Older women and men are more likely to be tired, shaky, and weak and have muscle aches and abdominal pain. A fever may mean the infection is in your kidneys. Other symptoms of a kidney infection include pain in your back or sides below the ribs, nausea, and vomiting.  DIAGNOSIS  To diagnose a UTI, your caregiver will ask you about your symptoms. Your caregiver also will ask to provide a urine sample. The urine sample will be tested for bacteria and white blood cells. White blood cells are made by your body to help fight infection.  TREATMENT   Typically, UTIs can be treated with medication. Because most UTIs are caused by a bacterial infection, they usually can be treated with the use of antibiotics. The choice of antibiotic and length of treatment depend on your symptoms and the type of bacteria causing your infection.  HOME CARE INSTRUCTIONS   If you were prescribed antibiotics, take them exactly as your caregiver instructs you. Finish the medication even if you feel better after you  have only taken some of the medication.   Drink enough water and fluids to keep your urine clear or pale yellow.   Avoid caffeine, tea, and carbonated beverages. They tend to irritate your bladder.   Empty your bladder often. Avoid holding urine for long periods of time.   Empty your bladder before and after sexual intercourse.   After a bowel movement, women should cleanse from front to back. Use each tissue only once.  SEEK MEDICAL CARE IF:    You have back pain.   You develop a fever.   Your symptoms do not begin to resolve within 3 days.  SEEK IMMEDIATE MEDICAL CARE IF:    You have severe back pain or lower abdominal pain.   You develop chills.   You have nausea or vomiting.   You have continued burning or discomfort with urination.  MAKE SURE YOU:    Understand these instructions.   Will watch your condition.   Will get help right away if you are not doing well or get worse.  Document Released: 04/29/2005 Document Revised: 01/19/2012 Document Reviewed: 08/28/2011  [employer] Patient Information 2014 [employer].

## 2013-01-02 NOTE — Progress Notes (Signed)
  Subjective:    Patient ID: Erika Lynch, female    DOB: [birth year], 79 y.o.   MRN: [id number]  HPI Patient presents to the clinic with chills, abdominal pressure, urinary urgency and frequency. She feels weak and woken up with chills since Friday(3 days). Has felt nauseated but no vomiting. Not tried anything to make better. Denies any upper respiratory systems.     Review of Systems  All other systems reviewed and are negative.       Objective:   Physical Exam  Constitutional: She is oriented to person, place, and time. She appears well-developed and well-nourished.  HENT:  Head: Normocephalic and atraumatic.  Cardiovascular: Normal rate, regular rhythm and normal heart sounds.   Pulmonary/Chest: Effort normal and breath sounds normal.  No CVA.   Abdominal: Soft. Bowel sounds are normal. She exhibits no mass. There is no tenderness.  Neurological: She is alert and oriented to person, place, and time.  Skin: Skin is warm and dry.  Psychiatric: She has a normal mood and affect. Her behavior is normal.          Assessment & Plan:  UTI- UA positive for blood and leuks. Treated with cipro for 7 days since having systemic symptoms. Gave HO for symptomatic control. Call if not improving.

## 2013-01-30 ENCOUNTER — Other Ambulatory Visit: Payer: Self-pay | Admitting: Physician Assistant

## 2013-02-27 ENCOUNTER — Encounter: Payer: Self-pay | Admitting: Physician Assistant

## 2013-02-27 ENCOUNTER — Ambulatory Visit (INDEPENDENT_AMBULATORY_CARE_PROVIDER_SITE_OTHER): Payer: Medicare Other | Admitting: Physician Assistant

## 2013-02-27 VITALS — BP 128/75 | HR 66 | Wt 131.0 lb

## 2013-02-27 DIAGNOSIS — M4800 Spinal stenosis, site unspecified: Secondary | ICD-10-CM

## 2013-02-27 MED ORDER — KETOROLAC TROMETHAMINE 60 MG/2ML IM SOLN
60.0000 mg | Freq: Once | INTRAMUSCULAR | Status: AC
  Administered 2013-02-27: 60 mg via INTRAMUSCULAR

## 2013-02-27 MED ORDER — CYCLOBENZAPRINE HCL 5 MG PO TABS: 5.0000 mg | ORAL_TABLET | Freq: Every evening | ORAL | Status: DC | PRN

## 2013-02-27 MED ORDER — PREDNISONE 50 MG PO TABS: ORAL_TABLET | ORAL | Status: DC

## 2013-02-27 MED ORDER — METOPROLOL TARTRATE 50 MG PO TABS: 50.0000 mg | ORAL_TABLET | Freq: Two times a day (BID) | ORAL | Status: DC

## 2013-02-27 MED ORDER — AMLODIPINE BESYLATE 5 MG PO TABS: 5.0000 mg | ORAL_TABLET | Freq: Every day | ORAL | Status: DC

## 2013-02-27 MED ORDER — OMEPRAZOLE 40 MG PO CPDR: 40.0000 mg | DELAYED_RELEASE_CAPSULE | Freq: Every day | ORAL | Status: DC

## 2013-02-27 NOTE — Patient Instructions (Addendum)
Start prednisone for 5 days.  Flexeril as needed.  Continue exercises every day. Toradol in office.   referral to get epidural injections.

## 2013-02-27 NOTE — Progress Notes (Signed)
  Subjective:    Patient ID: Erika Lynch, female    DOB: 1932-08-21, 79 y.o.   MRN: [id number]  HPI Pt is a 79 yo female who presents to the clinic with a low back pain with radiation flare up. Pt has diagnosed spinal stenosis of lumbar spine. She was seen by orthopedist back in December and did not think she was a candidate for surgery but thought she might need epidural injections. She was never contacted with any information and has not done anything since that appt. For the last week or so pain in low back and burning down legs is worse. Denies any stool or urinary incontience or saddle anthesisa. Feels her pulse beating in both of her heels when standing. Last night got so bad without any relief. She has had flexeril before and did help some. Just laying down usually makes some better. Any movement makes worse.    Review of Systems  All other systems reviewed and are negative.       Objective:   Physical Exam  Constitutional: She is oriented to person, place, and time. She appears well-developed and well-nourished.  HENT:  Head: Normocephalic and atraumatic.  Cardiovascular: Normal rate, regular rhythm and normal heart sounds.   Pulmonary/Chest: Effort normal and breath sounds normal.  Musculoskeletal:  Negative straight leg test bilaterally. Strength of bilateral legs 4/5. Pain over lumbar spine with palpitation.   Neurological: She is alert and oriented to person, place, and time.  Skin: Skin is warm and dry.  Psychiatric: She has a normal mood and affect. Her behavior is normal.          Assessment & Plan:  Spinal stenosis-  Start prednisone for 5 days.  Flexeril as needed.  Continue exercises every day. Toradol 60mg  IM in office.  Need to have a conversation with Dr. Linford Arnold about epidural injections and see if they will help her out.   Spent 30 minutes with patient and greater than 50 percent of visit spent counseling pt regarding treatment plan.

## 2013-02-28 ENCOUNTER — Telehealth: Payer: Self-pay | Admitting: Physician Assistant

## 2013-02-28 NOTE — Telephone Encounter (Signed)
Dr. Linford Arnold I saw this patient yesterday for spinal stenosis exacerbation. I looked back through history and you sent her to ortho for evaluation. They recommened epidural injections but she said nothing has ever been set up. Where do you suggest we send for that? Should I tell her to make a follow up appt for ortho that she had been seeing? Last seen in December by ortho.

## 2013-03-01 NOTE — Telephone Encounter (Signed)
Will you call [facility]. Pt has spinal stenosis and back in December they evaluated and states she could benefit from epidural injections. Nothing was ever done to follow up. Pt is interested in something to help pain. [facility] last note stated they could refer to Dr. Ethelene Hal. Could you see if anything needs to be done on our part or does pt need to be seen again in their clinic.  Thanks,

## 2013-03-01 NOTE — Telephone Encounter (Signed)
Yes, I would have her followup with the orthopedist. Typically they have someone in the orthopedic group who does these injections. She might even be able to call and have them pull her old chart to see that that was the recommendation and then have him schedule her directly with a provider there he does them.

## 2013-03-01 NOTE — Telephone Encounter (Signed)
Spoke with [facility] and they states pt needs to call them and schedule appointment- nothing further as far as referral is needed from [place].  Called pt and gave her the phone # [phone] and doctors name to call and schedule appt. Barry Dienes, LPN

## 2013-05-15 ENCOUNTER — Encounter (INDEPENDENT_AMBULATORY_CARE_PROVIDER_SITE_OTHER): Payer: Self-pay

## 2013-05-15 ENCOUNTER — Ambulatory Visit (INDEPENDENT_AMBULATORY_CARE_PROVIDER_SITE_OTHER): Payer: Medicare Other | Admitting: Physician Assistant

## 2013-05-15 ENCOUNTER — Encounter: Payer: Self-pay | Admitting: Physician Assistant

## 2013-05-15 VITALS — BP 130/71 | HR 68 | Wt 132.0 lb

## 2013-05-15 DIAGNOSIS — J209 Acute bronchitis, unspecified: Secondary | ICD-10-CM

## 2013-05-15 MED ORDER — AZITHROMYCIN 250 MG PO TABS: ORAL_TABLET | ORAL | Status: DC

## 2013-05-15 MED ORDER — VITAMIN B-12 100 MCG PO TABS: 50.0000 ug | ORAL_TABLET | Freq: Every day | ORAL | Status: DC

## 2013-05-15 MED ORDER — PREDNISONE 20 MG PO TABS: 20.0000 mg | ORAL_TABLET | Freq: Every day | ORAL | Status: DC

## 2013-05-15 MED ORDER — ALBUTEROL SULFATE (2.5 MG/3ML) 0.083% IN NEBU
2.5000 mg | INHALATION_SOLUTION | Freq: Once | RESPIRATORY_TRACT | Status: AC
  Administered 2013-05-15: 2.5 mg via RESPIRATORY_TRACT

## 2013-05-15 NOTE — Progress Notes (Signed)
  Subjective:    Patient ID: Erika Lynch, female    DOB: 1932/10/13, 79 y.o.   MRN: [id number]  HPI Patient is a 79 yo female who presents to the clinic with productive cough, congested ears and nasal drainage. Patient is a former smoker but has not smoked in the last 5 years. She went to the fair last Wednesday and has had these symptoms since which has been approximately 6 days. Her cough continues to worsen. She is coughing up white sputum. She denies a fever, nausea, vomiting or diarrhea. She's tried some over-the-counter decongestants. They have helped minimally.    Review of Systems     Objective:   Physical Exam  Constitutional: She is oriented to person, place, and time. She appears well-developed and well-nourished.  HENT:  Head: Normocephalic and atraumatic.  Right Ear: External ear normal.  Left Ear: External ear normal.  Nose: Nose normal.  Mouth/Throat: Oropharynx is clear and moist.  Eyes: Conjunctivae are normal. Right eye exhibits no discharge. Left eye exhibits no discharge.  Neck: Normal range of motion. Neck supple.  Cardiovascular: Normal rate, regular rhythm and normal heart sounds.   Pulmonary/Chest:  Bilateral wheezing at base of both lungs. Rhonchi heard throughout both lungs.  Lymphadenopathy:    She has no cervical adenopathy.  Neurological: She is alert and oriented to person, place, and time.  Skin: Skin is warm and dry.  Psychiatric: She has a normal mood and affect. Her behavior is normal.          Assessment & Plan:  Acute bronchitis- due 2 significant wheezing on examination nebulizer was given in office  Of albuterol. Patient was given a ten-day course of steroid. Patient was also treated with azithromycin. Handout was given for bronchitis and symptomatic care. Patient encouraged to call office if not improving or worsening.

## 2013-05-15 NOTE — Patient Instructions (Signed)
Take zpak. Take prednisone.   Acute Bronchitis You have acute bronchitis. This means you have a chest cold. The airways in your lungs are red and sore (inflamed). Acute means it is sudden onset.  CAUSES Bronchitis is most often caused by the same virus that causes a cold. SYMPTOMS   Body aches.  Chest congestion.  Chills.  Cough.  Fever.  Shortness of breath.  Sore throat. TREATMENT  Acute bronchitis is usually treated with rest, fluids, and medicines for relief of fever or cough. Most symptoms should go away after a few days or a week. Increased fluids may help thin your secretions and will prevent dehydration. Your caregiver may give you an inhaler to improve your symptoms. The inhaler reduces shortness of breath and helps control cough. You can take over-the-counter pain relievers or cough medicine to decrease coughing, pain, or fever. A cool-air vaporizer may help thin bronchial secretions and make it easier to clear your chest. Antibiotics are usually not needed but can be prescribed if you smoke, are seriously ill, have chronic lung problems, are elderly, or you are at higher risk for developing complications.Allergies and asthma can make bronchitis worse. Repeated episodes of bronchitis may cause longstanding lung problems. Avoid smoking and secondhand smoke.Exposure to cigarette smoke or irritating chemicals will make bronchitis worse. If you are a cigarette smoker, consider using nicotine gum or skin patches to help control withdrawal symptoms. Quitting smoking will help your lungs heal faster. Recovery from bronchitis is often slow, but you should start feeling better after 2 to 3 days. Cough from bronchitis frequently lasts for 3 to 4 weeks. To prevent another bout of acute bronchitis:  Quit smoking.  Wash your hands frequently to get rid of viruses or use a hand sanitizer.  Avoid other people with cold or virus symptoms.  Try not to touch your hands to your mouth,  nose, or eyes. SEEK IMMEDIATE MEDICAL CARE IF:  You develop increased fever, chills, or chest pain.  You have severe shortness of breath or bloody sputum.  You develop dehydration, fainting, repeated vomiting, or a severe headache.  You have no improvement after 1 week of treatment or you get worse. MAKE SURE YOU:   Understand these instructions.  Will watch your condition.  Will get help right away if you are not doing well or get worse. Document Released: 08/27/2004 Document Revised: 10/12/2011 Document Reviewed: 11/12/2010 [facility] Patient Information 2014 [place], [place].

## 2013-05-29 ENCOUNTER — Other Ambulatory Visit: Payer: Self-pay | Admitting: Physician Assistant

## 2013-05-29 NOTE — Telephone Encounter (Signed)
Needs appt before future refills

## 2013-06-02 ENCOUNTER — Encounter: Payer: Self-pay | Admitting: Family Medicine

## 2013-06-02 ENCOUNTER — Ambulatory Visit (INDEPENDENT_AMBULATORY_CARE_PROVIDER_SITE_OTHER): Payer: Medicare Other | Admitting: Family Medicine

## 2013-06-02 VITALS — BP 104/60 | HR 78 | Ht 63.0 in | Wt 132.0 lb

## 2013-06-02 DIAGNOSIS — J019 Acute sinusitis, unspecified: Secondary | ICD-10-CM

## 2013-06-02 DIAGNOSIS — J209 Acute bronchitis, unspecified: Secondary | ICD-10-CM

## 2013-06-02 MED ORDER — DOXYCYCLINE HYCLATE 100 MG PO TABS: 100.0000 mg | ORAL_TABLET | Freq: Two times a day (BID) | ORAL | Status: DC

## 2013-06-02 MED ORDER — ALBUTEROL SULFATE HFA 108 (90 BASE) MCG/ACT IN AERS: 2.0000 | INHALATION_SPRAY | Freq: Four times a day (QID) | RESPIRATORY_TRACT | Status: DC | PRN

## 2013-06-02 NOTE — Progress Notes (Signed)
  Subjective:    Patient ID: Erika Lynch, female    DOB: 04-28-1933, 79 y.o.   MRN: [id number]  HPI She was seen by my partner approximately 2 weeks ago for acute bronchitis and treated with prednisone and azithromycin. She had Re: had 6 days of symptoms at that point. She did feel some better after the antibiotic, but started feeling worse again this week. She's not been able to sleep well at all the last 2 nights because of cough and congestion..  The she has been sick for 3 weeks at this point in time. She still having a lot of post nasal drip, and cough. Still having white clear thick drainage. C/o swollen glands in her neck. Stil on zyrtec and flonase.  Feels very congested. No facila pain or pressure. No fever sweats or chills.   Review of Systems     Objective:   Physical Exam  Constitutional: She is oriented to person, place, and time. She appears well-developed and well-nourished.  HENT:  Head: Normocephalic and atraumatic.  Right Ear: External ear normal.  Left Ear: External ear normal.  Nose: Nose normal.  Mouth/Throat: Oropharynx is clear and moist.  TMs and canals are clear.   Eyes: Conjunctivae and EOM are normal. Pupils are equal, round, and reactive to light.  Neck: Neck supple. No thyromegaly present.  Cardiovascular: Normal rate, regular rhythm and normal heart sounds.   Pulmonary/Chest: Effort normal and breath sounds normal. She has no wheezes.  Lymphadenopathy:    She has no cervical adenopathy.  Neurological: She is alert and oriented to person, place, and time.  Skin: Skin is warm and dry.  Psychiatric: She has a normal mood and affect.          Assessment & Plan:  Acute sinusitis/Acute bronchitis - will put her on a new antibiotic. She is allergic to penicillin and sulfa. Will put her on doxycycline. If she's not in the next week and please let me know. Drink plenty of fluids and stay hydrated. I did recommend she continue her allergy medications as she  does have significant allergies. I did not refill the prednisone at this time she was not wheezing today. I did send over an albuterol inhaler for her to use every 4-6 hours as needed for wheezing. She's never used an inhaler before [place] her to have the pharmacist to her how to use it. If she has any problems please call the office back.

## 2013-06-26 ENCOUNTER — Other Ambulatory Visit: Payer: Self-pay | Admitting: Physician Assistant

## 2013-07-28 ENCOUNTER — Other Ambulatory Visit: Payer: Self-pay | Admitting: Family Medicine

## 2013-07-31 ENCOUNTER — Other Ambulatory Visit: Payer: Self-pay | Admitting: Physician Assistant

## 2013-08-11 ENCOUNTER — Ambulatory Visit (INDEPENDENT_AMBULATORY_CARE_PROVIDER_SITE_OTHER): Payer: Medicare Other

## 2013-08-11 ENCOUNTER — Ambulatory Visit (INDEPENDENT_AMBULATORY_CARE_PROVIDER_SITE_OTHER): Payer: Medicare Other | Admitting: Family Medicine

## 2013-08-11 ENCOUNTER — Encounter (HOSPITAL_BASED_OUTPATIENT_CLINIC_OR_DEPARTMENT_OTHER): Payer: Self-pay | Admitting: Emergency Medicine

## 2013-08-11 ENCOUNTER — Emergency Department (HOSPITAL_BASED_OUTPATIENT_CLINIC_OR_DEPARTMENT_OTHER)
Admission: EM | Admit: 2013-08-11 | Discharge: 2013-08-11 | Disposition: A | Discharge status: Home / Self Care | Payer: Medicare Other | Attending: Emergency Medicine | Admitting: Emergency Medicine

## 2013-08-11 ENCOUNTER — Encounter: Payer: Self-pay | Admitting: Family Medicine

## 2013-08-11 VITALS — BP 117/65 | HR 75 | Temp 97.6°F | Ht 63.0 in | Wt 135.0 lb

## 2013-08-11 DIAGNOSIS — M25475 Effusion, left foot: Secondary | ICD-10-CM

## 2013-08-11 DIAGNOSIS — Z87442 Personal history of urinary calculi: Secondary | ICD-10-CM | POA: Insufficient documentation

## 2013-08-11 DIAGNOSIS — M79609 Pain in unspecified limb: Secondary | ICD-10-CM

## 2013-08-11 DIAGNOSIS — Z8719 Personal history of other diseases of the digestive system: Secondary | ICD-10-CM | POA: Insufficient documentation

## 2013-08-11 DIAGNOSIS — Z79899 Other long term (current) drug therapy: Secondary | ICD-10-CM | POA: Insufficient documentation

## 2013-08-11 DIAGNOSIS — S93609A Unspecified sprain of unspecified foot, initial encounter: Secondary | ICD-10-CM | POA: Insufficient documentation

## 2013-08-11 DIAGNOSIS — M25473 Effusion, unspecified ankle: Secondary | ICD-10-CM

## 2013-08-11 DIAGNOSIS — M25476 Effusion, unspecified foot: Secondary | ICD-10-CM

## 2013-08-11 DIAGNOSIS — Z8541 Personal history of malignant neoplasm of cervix uteri: Secondary | ICD-10-CM | POA: Insufficient documentation

## 2013-08-11 DIAGNOSIS — Z87891 Personal history of nicotine dependence: Secondary | ICD-10-CM | POA: Insufficient documentation

## 2013-08-11 DIAGNOSIS — Y939 Activity, unspecified: Secondary | ICD-10-CM | POA: Insufficient documentation

## 2013-08-11 DIAGNOSIS — IMO0002 Reserved for concepts with insufficient information to code with codable children: Secondary | ICD-10-CM | POA: Insufficient documentation

## 2013-08-11 DIAGNOSIS — M79672 Pain in left foot: Secondary | ICD-10-CM

## 2013-08-11 DIAGNOSIS — X58XXXA Exposure to other specified factors, initial encounter: Secondary | ICD-10-CM | POA: Insufficient documentation

## 2013-08-11 DIAGNOSIS — S93602A Unspecified sprain of left foot, initial encounter: Secondary | ICD-10-CM

## 2013-08-11 DIAGNOSIS — Z792 Long term (current) use of antibiotics: Secondary | ICD-10-CM | POA: Insufficient documentation

## 2013-08-11 DIAGNOSIS — Z880 Allergy status to penicillin: Secondary | ICD-10-CM | POA: Insufficient documentation

## 2013-08-11 DIAGNOSIS — Y929 Unspecified place or not applicable: Secondary | ICD-10-CM | POA: Insufficient documentation

## 2013-08-11 LAB — D-DIMER, QUANTITATIVE (NOT AT ARMC): D-Dimer, Quant: 1.11 ug/mL-FEU — ABNORMAL HIGH (ref 0.00–0.48)

## 2013-08-11 MED ORDER — METOPROLOL TARTRATE 50 MG PO TABS: ORAL_TABLET | ORAL | Status: DC

## 2013-08-11 MED ORDER — CEPHALEXIN 500 MG PO CAPS: 500.0000 mg | ORAL_CAPSULE | Freq: Three times a day (TID) | ORAL | Status: DC

## 2013-08-11 MED ORDER — AMLODIPINE BESYLATE 5 MG PO TABS: ORAL_TABLET | ORAL | Status: DC

## 2013-08-11 MED ORDER — ALBUTEROL SULFATE HFA 108 (90 BASE) MCG/ACT IN AERS: 2.0000 | INHALATION_SPRAY | Freq: Four times a day (QID) | RESPIRATORY_TRACT | Status: DC | PRN

## 2013-08-11 MED ORDER — ENOXAPARIN SODIUM 80 MG/0.8ML ~~LOC~~ SOLN
1.0000 mg/kg | Freq: Once | SUBCUTANEOUS | Status: AC
  Administered 2013-08-11: 65 mg via SUBCUTANEOUS
  Filled 2013-08-11: qty 0.8

## 2013-08-11 NOTE — Discharge Instructions (Signed)
Foot Sprain The muscles and cord like structures which attach muscle to bone (tendons) that surround the feet are made up of units. A foot sprain can occur at the weakest spot in any of these units. This condition is most often caused by injury to or overuse of the foot, as from playing contact sports, or aggravating a previous injury, or from poor conditioning, or obesity. SYMPTOMS  Pain with movement of the foot.  Tenderness and swelling at the injury site.  Loss of strength is present in moderate or severe sprains. THE THREE GRADES OR SEVERITY OF FOOT SPRAIN ARE:  Mild (Grade I): Slightly pulled muscle without tearing of muscle or tendon fibers or loss of strength.  Moderate (Grade II): Tearing of fibers in a muscle, tendon, or at the attachment to bone, with small decrease in strength.  Severe (Grade III): Rupture of the muscle-tendon-bone attachment, with separation of fibers. Severe sprain requires surgical repair. Often repeating (chronic) sprains are caused by overuse. Sudden (acute) sprains are caused by direct injury or over-use. DIAGNOSIS  Diagnosis of this condition is usually by your own observation. If problems continue, a caregiver may be required for further evaluation and treatment. X-rays may be required to make sure there are not breaks in the bones (fractures) present. Continued problems may require physical therapy for treatment. PREVENTION  Use strength and conditioning exercises appropriate for your sport.  Warm up properly prior to working out.  Use athletic shoes that are made for the sport you are participating in.  Allow adequate time for healing. Early return to activities makes repeat injury more likely, and can lead to an unstable arthritic foot that can result in prolonged disability. Mild sprains generally heal in 3 to 10 days, with moderate and severe sprains taking 2 to 10 weeks. Your caregiver can help you determine the proper time required for  healing. HOME CARE INSTRUCTIONS   Apply ice to the injury for 15-20 minutes, 03-04 times per day. Put the ice in a plastic bag and place a towel between the bag of ice and your skin.  An elastic wrap (like an Ace bandage) may be used to keep swelling down.  Keep foot above the level of the heart, or at least raised on a footstool, when swelling and pain are present.  Try to avoid use other than gentle range of motion while the foot is painful. Do not resume use until instructed by your caregiver. Then begin use gradually, not increasing use to the point of pain. If pain does develop, decrease use and continue the above measures, gradually increasing activities that do not cause discomfort, until you gradually achieve normal use.  Use crutches if and as instructed, and for the length of time instructed.  Keep injured foot and ankle wrapped between treatments.  Massage foot and ankle for comfort and to keep swelling down. Massage from the toes up towards the knee.  Only take over-the-counter or prescription medicines for pain, discomfort, or fever as directed by your caregiver. SEEK IMMEDIATE MEDICAL CARE IF:   Your pain and swelling increase, or pain is not controlled with medications.  You have loss of feeling in your foot or your foot turns cold or blue.  You develop new, unexplained symptoms, or an increase of the symptoms that brought you to your caregiver. MAKE SURE YOU:   Understand these instructions.  Will watch your condition.  Will get help right away if you are not doing well or get worse. Document Released:   01/09/2002 Document Revised: 10/12/2011 Document Reviewed: 03/08/2008 [employer] Patient Information 2014 [employer].  

## 2013-08-11 NOTE — ED Notes (Signed)
Left foot pain and swelling x2 days.  Saw pmd today and sts was sent to the ED for further eval.

## 2013-08-11 NOTE — ED Provider Notes (Signed)
CSN: [id number]     Arrival date & time 08/11/13  1824 History  This chart was scribed for Charles B. Karle Starch, MD by Rolanda Lundborg, ED Scribe. This patient was seen in room MH03/MH03 and the patient's care was started at 6:33 PM.   Chief Complaint  Patient presents with  . Foot Pain   The history is provided by the patient. No language interpreter was used.   HPI Comments: FUSAE FLORIO is a 80 y.o. female who presents to the Emergency Department complaining of left foot pain and swelling for the last 2 days. Pt states she was seen by her PCP today who did blood work and told pt to come here for a possible DVT. They did x-rays that were negative. She reports redness and warmth this morning. She states the pain has resolved and the redness and swelling have improved since she started wearing the Ace bandage today. She denies any scratches or skin tears to the area.    Past Medical History  Diagnosis Date  . Dyskinesia of esophagus   . Uterine cancer   . Kidney stone    Past Surgical History  Procedure Laterality Date  . Abdominal hysterectomy      Uterine cancer  . Back surgery     Family History  Problem Relation Age of Onset  . Hypertension Father   . Hyperlipidemia Father   . Heart attack Father   . Stroke Mother   . Heart attack Brother   . Heart attack Brother   . Colon cancer Sister   . Breast cancer     History  Substance Use Topics  . Smoking status: Former Research scientist (life sciences)  . Smokeless tobacco: Not on file  . Alcohol Use: No   OB History   Grav Para Term Preterm Abortions TAB SAB Ect Mult Living                 Review of Systems  Cardiovascular: Positive for leg swelling.   A complete 10 system review of systems was obtained and all systems are negative except as noted in the HPI and PMH.   Allergies  Aspirin; Cisapride; Imipramine hcl; Penicillins; and Sulfonamide derivatives  Home Medications   Current Outpatient Rx  Name  Route  Sig  Dispense  Refill  .  albuterol (PROVENTIL HFA;VENTOLIN HFA) 108 (90 BASE) MCG/ACT inhaler   Inhalation   Inhale 2 puffs into the lungs every 6 (six) hours as needed for wheezing.   1 Inhaler   1   . amLODipine (NORVASC) 5 MG tablet      TAKE 1 TABLET DAILY.   30 tablet   2   . cephALEXin (KEFLEX) 500 MG capsule   Oral   Take 1 capsule (500 mg total) by mouth 3 (three) times daily.   30 capsule   0   . cetirizine (ZYRTEC ALLERGY) 10 MG tablet   Oral   Take 1 tablet (10 mg total) by mouth daily.   30 tablet   3   . cyclobenzaprine (FLEXERIL) 5 MG tablet   Oral   Take 1 tablet (5 mg total) by mouth at bedtime as needed.   30 tablet   3   . fluticasone (FLONASE) 50 MCG/ACT nasal spray   Nasal   Place 2 sprays into the nose daily.   16 g   2   . metoprolol (LOPRESSOR) 50 MG tablet      TAKE 1 TABLET TWO TIMES DAILY.   [place]  tablet   1   . Multiple Vitamins-Minerals (CENTRUM SILVER ULTRA WOMENS PO)   Oral   Take by mouth.         Marland Kitchen omeprazole (PRILOSEC) 40 MG capsule   Oral   Take 1 capsule (40 mg total) by mouth daily.   30 capsule   11   . vitamin B-12 (CYANOCOBALAMIN) 100 MCG tablet   Oral   Take 0.5 tablets (50 mcg total) by mouth daily.   30 tablet   11    BP 152/71  Pulse 75  Temp(Src) 98.6 F (37 C) (Oral)  Resp 16  Ht 5\' 3"  (1.6 m)  Wt 139 lb (63.05 kg)  BMI 24.63 kg/m2  SpO2 99% Physical Exam  Nursing note and vitals reviewed. Constitutional: She is oriented to person, place, and time. She appears well-developed and well-nourished.  HENT:  Head: Normocephalic and atraumatic.  Eyes: EOM are normal. Pupils are equal, round, and reactive to light.  Neck: Normal range of motion. Neck supple.  Cardiovascular: Normal rate, normal heart sounds and intact distal pulses.   Pulmonary/Chest: Effort normal and breath sounds normal.  Abdominal: Bowel sounds are normal. She exhibits no distension. There is no tenderness.  Musculoskeletal: Normal range of motion. She  exhibits no edema and no tenderness.  Mild soft tissue swelling left foot. Mild warmth.   Neurological: She is alert and oriented to person, place, and time. She has normal strength. No cranial nerve deficit or sensory deficit.  Skin: Skin is warm and dry. No rash noted.  Psychiatric: She has a normal mood and affect.    ED Course  Procedures (including critical care time) Medications - No data to display  DIAGNOSTIC STUDIES: Oxygen Saturation is 99% on RA, normal by my interpretation.    COORDINATION OF CARE: 6:44 PM- Discussed treatment plan with pt which includes dose of blood thinners and schedule [place] at [facility] for tomorrow morning. Pt agrees to plan.    Labs Review Labs Reviewed - No data to display Imaging Review Dg Foot Complete Left  08/11/2013   CLINICAL DATA:  Left foot pain with redness and warmth.  EXAM: LEFT FOOT - COMPLETE 3+ VIEW  COMPARISON:  None.  FINDINGS: Mild degenerative changes in the 1st metatarsophalangeal and 3rd distal interphalangeal joints. No acute osseous or joint abnormality.  IMPRESSION: 1. No acute osseous or joint abnormality. 2. Degenerative changes in the 1st metatarsal phalangeal and 3rd distal interphalangeal joints.   Electronically Signed   By: Lorin Picket M.D.   On: 08/11/2013 14:36    EKG Interpretation   None       MDM   1. Sprain of foot joint, left, initial encounter     Pt with elevated d-dimer sent from PCP office for [place]. Pt informed that [place] not available at this time or tomorrow. Given Lovenox and order placed for OP Doppler at [facility] for tomorrow.   I personally performed the services described in this documentation, which was scribed in my presence. The recorded information has been reviewed and is accurate.      Charles B. Karle Starch, MD 08/11/13 2141

## 2013-08-11 NOTE — ED Notes (Signed)
MD at bedside. 

## 2013-08-11 NOTE — ED Notes (Signed)
Multiple staff attempted to schedule patient for op vascular study at [place] per md request. Vascular department closes at 1900. Chasity, Network engineer for days spoke with vascular department on call staff who took patient information down, but informed us to call at [phone] to schedule pt for vascular study tomorrow am. Dr. Karle Starch aware of plan. Pts pcp is Dr. Beatrice Lecher. Vascular study is to be ordered under pcp name and pcp will follow up with results, per Dr. Karle Starch. Pt informed of poc, verified correct phone number. Night charge RN will call and schedule in am and notify patient of time to arrive for study at [facility] [place]

## 2013-08-11 NOTE — Progress Notes (Signed)
   Subjective:    Patient ID: Erika Lynch, female    DOB: Jan 15, 1933, 80 y.o.   MRN: [id number]  HPI Stepped onto [address] Lei 3 days ago and heard her knee pop and then her knee gave out. She didn't actually fall.   Has had foot but no knee pain since then. The pain didn't start until the morning after. It was difficult to walk on it the first day. In fact she was mostly walking on the ball of her foot for 2 days. Radiates up into her calf if she walks on it. Can flex her ankle and toes without problem. . It is now red and swollen. More swollen today. Has been icing it  Her toes are even swollen. No and the skin. No fevers chills or sweats.   Review of Systems     Objective:   Physical Exam  Constitutional: She is oriented to person, place, and time. She appears well-developed and well-nourished.  HENT:  Head: Normocephalic and atraumatic.  Musculoskeletal:  Left knee, ankle and toes with normal range of motion. No laxity of the joint. She does have 1+ edema around the ankle medially and laterally. She also has edema throughout the rest of the foot and the toes. Her foot is erythematous. The edge of the erythema is not well demarcated just states just above the ankle. Capillary refill is 3 seconds on the great toe. Dorsal pedal pulses 2+. She is tender over the distal second third metatarsals as well as just below the lateral malleolus just slightly anterior. No actual bruising to the foot. She is tender over the ball of her foot.  Neurological: She is alert and oriented to person, place, and time.  Skin: Skin is warm and dry.  Psychiatric: She has a normal mood and affect. Her behavior is normal.          Assessment & Plan:  Left foot pain and swelling-did get an x-ray today since she does have pain at the distal second and third metatarsals. She does have normal range of motion of the ankle itself. Good capillary refill at the toes or foot feels cool to touch. I am concerned about the  erythema and if there's no fracture we'll consider putting her on antibiotics over the weekend. Because extends from above the ankle to her toes, gout is less likely. Will check a CBC. Since she does have pain radiating up to the calf with walking we'll check a d-dimer as well to rule out DVT.  X-ray shows no fracture. Keep elevated. Will apply a light Ace wrap for compression. Because of the erythema going to put her on Keflex over the weekend. I would like to see her back in about 5 days to make sure that it's healing well.

## 2013-08-12 ENCOUNTER — Ambulatory Visit (HOSPITAL_COMMUNITY)
Admission: RE | Admit: 2013-08-12 | Discharge: 2013-08-12 | Disposition: A | Discharge status: Home / Self Care | Payer: Medicare Other | Source: Ambulatory Visit | Attending: Emergency Medicine | Admitting: Emergency Medicine

## 2013-08-12 DIAGNOSIS — M79609 Pain in unspecified limb: Secondary | ICD-10-CM

## 2013-08-12 DIAGNOSIS — M7989 Other specified soft tissue disorders: Secondary | ICD-10-CM | POA: Insufficient documentation

## 2013-08-12 LAB — CBC WITH DIFFERENTIAL/PLATELET
Basophils Absolute: 0.1 10*3/uL (ref 0.0–0.1)
Basophils Relative: 1 % (ref 0–1)
EOS ABS: 0.3 10*3/uL (ref 0.0–0.7)
Eosinophils Relative: 3 % (ref 0–5)
HCT: 41.3 % (ref 36.0–46.0)
Hemoglobin: 14.2 g/dL (ref 12.0–15.0)
LYMPHS ABS: 5.1 10*3/uL — AB (ref 0.7–4.0)
LYMPHS PCT: 44 % (ref 12–46)
MCH: 31.8 pg (ref 26.0–34.0)
MCHC: 34.4 g/dL (ref 30.0–36.0)
MCV: 92.6 fL (ref 78.0–100.0)
Monocytes Absolute: 1 10*3/uL (ref 0.1–1.0)
Monocytes Relative: 9 % (ref 3–12)
NEUTROS ABS: 5 10*3/uL (ref 1.7–7.7)
Neutrophils Relative %: 43 % (ref 43–77)
PLATELETS: 262 10*3/uL (ref 150–400)
RBC: 4.46 MIL/uL (ref 3.87–5.11)
RDW: 14 % (ref 11.5–15.5)
WBC: 11.6 10*3/uL — AB (ref 4.0–10.5)

## 2013-08-12 NOTE — Progress Notes (Signed)
VASCULAR LAB PRELIMINARY  PRELIMINARY  PRELIMINARY  PRELIMINARY  Left lower extremity venous Doppler completed.    Preliminary report:  There is no DVT or SVT noted in the left lower extremity.  Erika Lynch, RVT 08/12/2013, 10:05 AM

## 2013-08-17 ENCOUNTER — Encounter: Payer: Self-pay | Admitting: Family Medicine

## 2013-08-17 ENCOUNTER — Ambulatory Visit (INDEPENDENT_AMBULATORY_CARE_PROVIDER_SITE_OTHER): Payer: Medicare Other | Admitting: Family Medicine

## 2013-08-17 VITALS — BP 124/67 | HR 71 | Temp 97.7°F | Wt 133.0 lb

## 2013-08-17 DIAGNOSIS — M7989 Other specified soft tissue disorders: Secondary | ICD-10-CM

## 2013-08-17 NOTE — Progress Notes (Signed)
   Subjective:    Patient ID: Erika Lynch, female    DOB: 1933-02-01, 80 y.o.   MRN: [id number]  HPI Left foot is till swollen and painful. Pain still near her heal to walk. The last time I saw her I did put her on oral antibiotics because the foot looked erythematous. It really has not improved or changed as far as the color. Still red. No fevers chills or sweats. She has been trying to keep it elevated as much as possible. Also her d-dimer came back elevated and I sent her to the emergency room. Her Doppler was negative for DVT which is reassuring but the foot is still swollen and painful.  She has been keeping an ACE wrap on it.    Review of Systems     Objective:   Physical Exam  Constitutional: She is oriented to person, place, and time. She appears well-developed and well-nourished.  HENT:  Head: Normocephalic and atraumatic.  Musculoskeletal:  Left foot is swollen and mildy erythematou. DP pulse is 2+. Cap refill about 2 seconds.  Tender over the distal 2nd and 3rd MT heads.  Tender near the anterior edge of the heal.   Neurological: She is alert and oriented to person, place, and time.  Skin: Skin is warm and dry.  Psychiatric: She has a normal mood and affect. Her behavior is normal.          Assessment & Plan:  Left foot swelling - Great pulse and good cap refill. I'm still concerned that she may have had a fracture that has not been picked up on x-ray. We discussed different options. One is we can move forward with a CT of the foot for further evaluation versus just putting her in a short Cam Walker for the next one to 2 weeks during the day and keeping elevated when she can and seeing if she's improving. If not then I will see her back in 2 weeks and we can consider moving forward with a CT of the foot. Can use Tylenol as needed for pain control.

## 2013-08-30 ENCOUNTER — Ambulatory Visit (INDEPENDENT_AMBULATORY_CARE_PROVIDER_SITE_OTHER): Payer: Medicare Other | Admitting: Family Medicine

## 2013-08-30 ENCOUNTER — Encounter: Payer: Self-pay | Admitting: Family Medicine

## 2013-08-30 VITALS — BP 127/69 | HR 66 | Temp 97.7°F | Wt 135.0 lb

## 2013-08-30 DIAGNOSIS — M7989 Other specified soft tissue disorders: Secondary | ICD-10-CM

## 2013-08-30 DIAGNOSIS — M79672 Pain in left foot: Secondary | ICD-10-CM

## 2013-08-30 DIAGNOSIS — M79609 Pain in unspecified limb: Secondary | ICD-10-CM

## 2013-08-30 NOTE — Progress Notes (Signed)
   Subjective:    Patient ID: Erika Lynch, female    DOB: 09/07/1932, 80 y.o.   MRN: [id number]  HPI f/u for foot swelling. pt reports that she is doing much better. she only c/o 1 place on the heel of her foot that is still a little sore/bothersome.  She has put some weight on it and has done well. She has been wearing her short cam walker consistantly. Says she feels ready to start wearing a normal shoe.  Review of Systems     Objective:   Physical Exam  Constitutional: She appears well-developed and well-nourished.  HENT:  Head: Normocephalic and atraumatic.  Musculoskeletal:  Left foot is still mildly tender over the third and fourth metatarsal heads. There is a little bit edema around that area on the top of the foot. She's also just slightly tender at the anterior edge of the heel on the bottom of her foot. No significant erythema. Capillary refill is about 4 seconds and toes are cool to touch today. Dorsal pedal pulses 2+.  Skin: Skin is warm and dry.  Psychiatric: She has a normal mood and affect. Her behavior is normal.          Assessment & Plan:  Left pain pain and swelling - Can discontinue short cam walker.  If starts to feel sore can put it back on it needed.  Elevate foot when able to. No longer needs to ice the. Can take anti-inflammatory as needed. If not continuing to heal over the next 3-4 weeks and please let me know.  Followup in 3 months for blood pressure and chronic kidney disease.

## 2013-10-30 ENCOUNTER — Other Ambulatory Visit: Payer: Self-pay | Admitting: *Deleted

## 2013-10-30 MED ORDER — FLUTICASONE PROPIONATE 50 MCG/ACT NA SUSP: 2.0000 | Freq: Every day | NASAL | Status: DC

## 2013-10-31 ENCOUNTER — Other Ambulatory Visit: Payer: Self-pay | Admitting: Family Medicine

## 2013-11-23 ENCOUNTER — Ambulatory Visit (INDEPENDENT_AMBULATORY_CARE_PROVIDER_SITE_OTHER): Payer: Medicare Other | Admitting: Family Medicine

## 2013-11-23 ENCOUNTER — Encounter: Payer: Self-pay | Admitting: Family Medicine

## 2013-11-23 VITALS — BP 137/74 | HR 76 | Wt 132.0 lb

## 2013-11-23 DIAGNOSIS — J019 Acute sinusitis, unspecified: Secondary | ICD-10-CM

## 2013-11-23 DIAGNOSIS — I10 Essential (primary) hypertension: Secondary | ICD-10-CM

## 2013-11-23 DIAGNOSIS — E538 Deficiency of other specified B group vitamins: Secondary | ICD-10-CM

## 2013-11-23 DIAGNOSIS — J45909 Unspecified asthma, uncomplicated: Secondary | ICD-10-CM | POA: Insufficient documentation

## 2013-11-23 MED ORDER — AMLODIPINE BESYLATE 5 MG PO TABS: ORAL_TABLET | ORAL | Status: DC

## 2013-11-23 MED ORDER — OMEPRAZOLE 40 MG PO CPDR: 40.0000 mg | DELAYED_RELEASE_CAPSULE | Freq: Every day | ORAL | Status: DC

## 2013-11-23 MED ORDER — FLUTICASONE PROPIONATE 50 MCG/ACT NA SUSP: 2.0000 | Freq: Every day | NASAL | Status: DC

## 2013-11-23 MED ORDER — METOPROLOL TARTRATE 50 MG PO TABS: ORAL_TABLET | ORAL | Status: DC

## 2013-11-23 MED ORDER — AZITHROMYCIN 250 MG PO TABS: ORAL_TABLET | ORAL | Status: DC

## 2013-11-23 MED ORDER — ALBUTEROL SULFATE HFA 108 (90 BASE) MCG/ACT IN AERS: INHALATION_SPRAY | RESPIRATORY_TRACT | Status: DC

## 2013-11-23 MED ORDER — BECLOMETHASONE DIPROPIONATE 40 MCG/ACT IN AERS: 2.0000 | INHALATION_SPRAY | Freq: Two times a day (BID) | RESPIRATORY_TRACT | Status: DC

## 2013-11-23 NOTE — Progress Notes (Signed)
   Subjective:    Patient ID: Erika Lynch, female    DOB: [birth year], 80 y.o.   MRN: [id number]  HPI Hypertension- Pt denies chest pain, SOB, dizziness, or heart palpitations.  Taking meds as directed w/o problems.  Denies medication side effects.    Has had severe allergies with the pollen. Has had sevre nasal congestion between her eyes and pain and pressure over her facial cheeks..  Her ears have been popping and sometimes rining. She has been coughig up a lot of thick white sputum.  Neck has been sore. No ST.  Has been keeping her up at night. She is not using anything OTC. Has been SOB with it.  Using her alubterol every 6 hours.  Still taking zyrtec.  She feels like she's getting worse. No fevers chills or sweats. She's not been sleeping well because of it.  She did notice she did have some prompting some of her medications filled recently at the pharmacy. According to our records and the Toprol was held on March 31 she says it was not filling until the 10th of the month.   Review of Systems     Objective:   Physical Exam  Constitutional: She is oriented to person, place, and time. She appears well-developed and well-nourished.  HENT:  Head: Normocephalic and atraumatic.  Right Ear: External ear normal.  Left Ear: External ear normal.  Nose: Nose normal.  Mouth/Throat: Oropharynx is clear and moist.  TMs and canals are clear.   Eyes: Conjunctivae and EOM are normal. Pupils are equal, round, and reactive to light.  Neck: Neck supple. No thyromegaly present.  Cardiovascular: Normal rate, regular rhythm and normal heart sounds.   Pulmonary/Chest: Effort normal and breath sounds normal. She has no wheezes.  Lymphadenopathy:    She has no cervical adenopathy.  Neurological: She is alert and oriented to person, place, and time.  Skin: Skin is warm and dry.  Psychiatric: She has a normal mood and affect.          Assessment & Plan:  HTN- well controlled.  Continue current  regimen. Followup in 6 months.  Asthmatic bronchitis-I think upon has really exacerbated her symptoms. I would like to start her on Qvar for at least the next 6-8 weeks. She's doing well at that point then we can stop again. Continue use albuterol every 6 hours as needed.  Acute sinusitis-will treat with azithromycin since she has a sulfa and penicillin allergy. Call if not significantly better in one week. Will refill her Zyrtec as well as her Flonase.  B12 deficiency-will check level. Continue supplement.

## 2013-11-24 LAB — COMPLETE METABOLIC PANEL WITH GFR
ALBUMIN: 4.5 g/dL (ref 3.5–5.2)
ALT: 11 U/L (ref 0–35)
AST: 16 U/L (ref 0–37)
Alkaline Phosphatase: 63 U/L (ref 39–117)
BUN: 13 mg/dL (ref 6–23)
CALCIUM: 10.1 mg/dL (ref 8.4–10.5)
CHLORIDE: 105 meq/L (ref 96–112)
CO2: 23 meq/L (ref 19–32)
Creat: 0.9 mg/dL (ref 0.50–1.10)
GFR, EST NON AFRICAN AMERICAN: 61 mL/min
GFR, Est African American: 71 mL/min
GLUCOSE: 89 mg/dL (ref 70–99)
POTASSIUM: 4.3 meq/L (ref 3.5–5.3)
SODIUM: 139 meq/L (ref 135–145)
TOTAL PROTEIN: 7.2 g/dL (ref 6.0–8.3)
Total Bilirubin: 0.5 mg/dL (ref 0.2–1.2)

## 2013-11-24 LAB — VITAMIN B12: Vitamin B-12: 327 pg/mL (ref 211–911)

## 2013-11-29 ENCOUNTER — Ambulatory Visit: Payer: Medicare Other | Admitting: Family Medicine

## 2014-01-29 ENCOUNTER — Other Ambulatory Visit: Payer: Self-pay | Admitting: Family Medicine

## 2014-02-26 ENCOUNTER — Other Ambulatory Visit: Payer: Self-pay | Admitting: Family Medicine

## 2014-05-28 ENCOUNTER — Other Ambulatory Visit: Payer: Self-pay | Admitting: Family Medicine

## 2014-08-27 ENCOUNTER — Other Ambulatory Visit: Payer: Self-pay | Admitting: Family Medicine

## 2014-08-27 NOTE — Telephone Encounter (Signed)
Pr needs f/u appt before future refills

## 2014-10-02 ENCOUNTER — Other Ambulatory Visit: Payer: Self-pay | Admitting: Family Medicine

## 2014-10-02 NOTE — Telephone Encounter (Signed)
Pt due for f/u appt

## 2014-10-31 ENCOUNTER — Other Ambulatory Visit: Payer: Self-pay | Admitting: Family Medicine

## 2014-11-10 ENCOUNTER — Emergency Department
Admission: EM | Admit: 2014-11-10 | Discharge: 2014-11-10 | Disposition: A | Discharge status: Home / Self Care | Payer: Medicare Other | Source: Home / Self Care

## 2014-11-10 ENCOUNTER — Encounter: Payer: Self-pay | Admitting: *Deleted

## 2014-11-10 DIAGNOSIS — H65112 Acute and subacute allergic otitis media (mucoid) (sanguinous) (serous), left ear: Secondary | ICD-10-CM

## 2014-11-10 MED ORDER — AZITHROMYCIN 250 MG PO TABS: ORAL_TABLET | ORAL | Status: DC

## 2014-11-10 MED ORDER — ANTIPYRINE-BENZOCAINE 5.4-1.4 % OT SOLN: OTIC | Status: DC

## 2014-11-10 NOTE — ED Provider Notes (Addendum)
CSN: [id number]     Arrival date & time 11/10/14  1051 History   None    Chief Complaint  Patient presents with  . Otalgia    L    HPI Pt complains of L ear pain 10/10 and dizziness since yesterday. Low-grade fever, night sweats. No definite vertigo or focal neurologic symptoms. No vision change. Has mild nasal congestion. Occasional discolored rhinorrhea. Denies facial pain over maxillary or frontal area. No neck pain. Denies any acute chest pain, shortness of breath, abdominal pain, nausea, vomiting.  Remainder of Review of Systems negative for acute change except as noted in the HPI.  Past Medical History  Diagnosis Date  . Dyskinesia of esophagus   . Uterine cancer   . Kidney stone    Past Surgical History  Procedure Laterality Date  . Abdominal hysterectomy      Uterine cancer  . Back surgery     Family History  Problem Relation Age of Onset  . Hypertension Father   . Hyperlipidemia Father   . Heart attack Father   . Stroke Mother   . Heart attack Brother   . Heart attack Brother   . Colon cancer Sister   . Breast cancer     History  Substance Use Topics  . Smoking status: Former Research scientist (life sciences)  . Smokeless tobacco: Not on file  . Alcohol Use: No   OB History    No data available     Review of Systems  Allergies  Aspirin; Cisapride; Imipramine hcl; Penicillins; and Sulfonamide derivatives  Home Medications   Prior to Admission medications   Medication Sig Start Date End Date Taking? Authorizing Provider  albuterol (PROAIR HFA) 108 (90 BASE) MCG/ACT inhaler Inhale 2 puffs into the lungs every 6 (six) hours as needed for wheezing. 11/23/13   Hali Marry, MD  amLODipine (NORVASC) 5 MG tablet TAKE 1 TABLET DAILY. 05/28/14   Donella Stade, PA-C  antipyrine-benzocaine (AURALGAN) otic solution 3 drops in affected ear(s) every 1 or 2 hours as needed for pain 11/10/14   Jacqulyn Cane, MD  azithromycin (ZITHROMAX Z-PAK) 250 MG tablet Take 2 tablets on day one,  then 1 tablet daily on days 2 through 5 11/10/14   Jacqulyn Cane, MD  cetirizine (ZYRTEC ALLERGY) 10 MG tablet Take 1 tablet (10 mg total) by mouth daily. 11/28/12   Jade L Breeback, PA-C  fluticasone (FLONASE) 50 MCG/ACT nasal spray Place 2 sprays into both nostrils daily. 05/28/14   Jade L Breeback, PA-C  metoprolol (LOPRESSOR) 50 MG tablet TAKE ONE TABLET TWICE DAILY 01/29/14   Hali Marry, MD  metoprolol (LOPRESSOR) 50 MG tablet TAKE ONE TABLET TWICE DAILY 08/27/14   Hali Marry, MD  metoprolol (LOPRESSOR) 50 MG tablet TAKE ONE TABLET TWICE DAILY 10/02/14   Marcial Pacas, DO  Multiple Vitamins-Minerals (CENTRUM SILVER ULTRA WOMENS PO) Take by mouth.    Historical Provider, MD  omeprazole (PRILOSEC) 40 MG capsule TAKE ONE CAPSULE DAILY 10/31/14   Hali Marry, MD  QVAR 40 MCG/ACT inhaler Inhale 2 puffs into the lungs 2 (two) times daily. 05/28/14   Jade L Breeback, PA-C  vitamin B-12 (CYANOCOBALAMIN) 100 MCG tablet Take 0.5 tablets (50 mcg total) by mouth daily. 05/15/13   Jade L Breeback, PA-C   BP 161/80 mmHg  Pulse 67  Temp(Src) 97.6 F (36.4 C) (Oral)  Ht 5\' 3"  (1.6 m)  Wt 131 lb (59.421 kg)  BMI 23.21 kg/m2  SpO2 98% Physical Exam  Constitutional:  She is oriented to person, place, and time. She appears well-developed and well-nourished. No distress.  HENT:  Head: Normocephalic and atraumatic.  Right Ear: Tympanic membrane, external ear and ear canal normal.  Left Ear: External ear and ear canal normal. Tympanic membrane is injected and erythematous.  Nose: Mucosal edema and rhinorrhea present.  Mouth/Throat: Oropharynx is clear and moist. No oral lesions.  Eyes: Conjunctivae are normal. No scleral icterus.  Neck: Neck supple.  Cardiovascular: Normal rate, regular rhythm and normal heart sounds.   Pulmonary/Chest: Effort normal and breath sounds normal.  Lymphadenopathy:    She has no cervical adenopathy.  Neurological: She is alert and oriented to person,  place, and time.  Skin: Skin is warm and dry.  Nursing note and vitals reviewed.   ED Course  Procedures (including critical care time) Labs Review Labs Reviewed - No data to display  Imaging Review No results found.   MDM   1. Acute mucoid otitis media of left ear    Treatment options discussed, as well as risks, benefits, alternatives. Reviewed her drug allergies to penicillin and sulfa Patient voiced understanding and agreement with the following plans: Discharge Medication List as of 11/10/2014 11:36 AM    START taking these medications   Details  antipyrine-benzocaine (AURALGAN) otic solution 3 drops in affected ear(s) every 1 or 2 hours as needed for pain, Print       Zithromax Z-PAK prescribed Both of the above prescriptions were printed and given to patient with instructions.  Other symptomatic care discussed. Follow-up with your primary care doctor in 5-7 days if not improving, or sooner if symptoms become worse. Precautions discussed. Red flags discussed. Questions invited and answered. Patient voiced understanding and agreement.      Jacqulyn Cane, MD 11/10/14 [place], MD 11/10/14 [phone]

## 2014-11-10 NOTE — ED Notes (Signed)
Pt complains of L ear pain 10/10 and dizziness since yesterday.

## 2014-11-29 ENCOUNTER — Other Ambulatory Visit: Payer: Self-pay | Admitting: Family Medicine

## 2014-11-29 ENCOUNTER — Encounter: Payer: Self-pay | Admitting: Family Medicine

## 2014-11-29 ENCOUNTER — Ambulatory Visit (INDEPENDENT_AMBULATORY_CARE_PROVIDER_SITE_OTHER): Payer: Medicare Other | Admitting: Family Medicine

## 2014-11-29 VITALS — BP 123/68 | HR 70 | Temp 97.7°F | Wt 129.0 lb

## 2014-11-29 DIAGNOSIS — R51 Headache: Secondary | ICD-10-CM | POA: Diagnosis not present

## 2014-11-29 DIAGNOSIS — H539 Unspecified visual disturbance: Secondary | ICD-10-CM

## 2014-11-29 DIAGNOSIS — R519 Headache, unspecified: Secondary | ICD-10-CM

## 2014-11-29 DIAGNOSIS — H9202 Otalgia, left ear: Secondary | ICD-10-CM | POA: Diagnosis not present

## 2014-11-29 MED ORDER — CEFDINIR 300 MG PO CAPS: 300.0000 mg | ORAL_CAPSULE | Freq: Two times a day (BID) | ORAL | Status: DC

## 2014-11-29 NOTE — Progress Notes (Signed)
   Subjective:    Patient ID: Erika Lynch, female    DOB: 10-21-1932, 81 y.o.   MRN: [id number]  HPI Here for left ear pain. She went to urgent care approximately 21 days ago and was diagnosed with acute mucoid otitis media of the left ear. She was placed on azithromycin and given some eardrops. She did complete the course of antibiotics and really doesn't feel much better. She says the pain radiates to her jaw and into her temple and initially was radiating down into her neck but that is a little bit better. She's also had some intermittent blurriness in the left I and felt like she Sings something in the corner of her eye but then would go away.   Review of Systems     Objective:   Physical Exam  Constitutional: She is oriented to person, place, and time. She appears well-developed and well-nourished.  HENT:  Head: Normocephalic and atraumatic.  Right Ear: External ear normal.  Left Ear: External ear normal.  Nose: Nose normal.  Mouth/Throat: Oropharynx is clear and moist.  TMs and canals are clear. Mildly tender over the left temple.   Eyes: Conjunctivae and EOM are normal. Pupils are equal, round, and reactive to light.  Neck: Neck supple. No thyromegaly present.  Cardiovascular: Normal rate, regular rhythm and normal heart sounds.   Pulmonary/Chest: Effort normal and breath sounds normal. She has no wheezes.  Lymphadenopathy:    She has no cervical adenopathy.  Neurological: She is alert and oriented to person, place, and time.  Skin: Skin is warm and dry.  Psychiatric: She has a normal mood and affect.          Assessment & Plan:  Left ear pain-unclear etiology. Today at least her air looks fantastic. She still having a lot of pain. We'll go and do tympanometry. In addition we'll check a sedimentation rate and a CBC. Consider temporal arteritis on the differential. tympanotmetry showed positive pressure in the left consistant with possible OM.  Will put her on 2nd  antibiotic, cefdinir to the phamracy. If labs normal and not better after 2nd abx, the f/u.

## 2014-11-30 ENCOUNTER — Other Ambulatory Visit: Payer: Self-pay | Admitting: Family Medicine

## 2014-11-30 LAB — CBC WITH DIFFERENTIAL/PLATELET
BASOS ABS: 0.1 10*3/uL (ref 0.0–0.1)
BASOS PCT: 1 % (ref 0–1)
EOS ABS: 0.6 10*3/uL (ref 0.0–0.7)
Eosinophils Relative: 5 % (ref 0–5)
HEMATOCRIT: 43.1 % (ref 36.0–46.0)
Hemoglobin: 14.6 g/dL (ref 12.0–15.0)
LYMPHS ABS: 6 10*3/uL — AB (ref 0.7–4.0)
Lymphocytes Relative: 48 % — ABNORMAL HIGH (ref 12–46)
MCH: 31.9 pg (ref 26.0–34.0)
MCHC: 33.9 g/dL (ref 30.0–36.0)
MCV: 94.3 fL (ref 78.0–100.0)
MONOS PCT: 7 % (ref 3–12)
MPV: 10.8 fL (ref 8.6–12.4)
Monocytes Absolute: 0.9 10*3/uL (ref 0.1–1.0)
Neutro Abs: 4.8 10*3/uL (ref 1.7–7.7)
Neutrophils Relative %: 39 % — ABNORMAL LOW (ref 43–77)
Platelets: 269 10*3/uL (ref 150–400)
RBC: 4.57 MIL/uL (ref 3.87–5.11)
RDW: 13.7 % (ref 11.5–15.5)
WBC: 12.4 10*3/uL — ABNORMAL HIGH (ref 4.0–10.5)

## 2014-11-30 LAB — C-REACTIVE PROTEIN

## 2014-11-30 LAB — PATHOLOGIST SMEAR REVIEW

## 2014-11-30 LAB — SEDIMENTATION RATE: Sed Rate: 7 mm/hr (ref 0–30)

## 2014-12-27 ENCOUNTER — Other Ambulatory Visit: Payer: Self-pay | Admitting: Family Medicine

## 2014-12-28 ENCOUNTER — Encounter: Payer: Self-pay | Admitting: Emergency Medicine

## 2014-12-28 ENCOUNTER — Emergency Department
Admission: EM | Admit: 2014-12-28 | Discharge: 2014-12-28 | Disposition: A | Discharge status: Home / Self Care | Payer: Medicare Other | Source: Home / Self Care | Attending: Family Medicine | Admitting: Family Medicine

## 2014-12-28 ENCOUNTER — Emergency Department (INDEPENDENT_AMBULATORY_CARE_PROVIDER_SITE_OTHER): Payer: Medicare Other

## 2014-12-28 DIAGNOSIS — M25571 Pain in right ankle and joints of right foot: Secondary | ICD-10-CM

## 2014-12-28 DIAGNOSIS — M25572 Pain in left ankle and joints of left foot: Secondary | ICD-10-CM | POA: Diagnosis not present

## 2014-12-28 DIAGNOSIS — I809 Phlebitis and thrombophlebitis of unspecified site: Secondary | ICD-10-CM | POA: Diagnosis not present

## 2014-12-28 LAB — POCT CBC W AUTO DIFF (K'VILLE URGENT CARE)

## 2014-12-28 NOTE — ED Notes (Signed)
Left foot pain since yesterday, red, swollen, painful, denies trauma.

## 2014-12-28 NOTE — ED Provider Notes (Signed)
CSN: [id number]     Arrival date & time 12/28/14  1003 History   First MD Initiated Contact with Patient 12/28/14 1030     Chief Complaint  Patient presents with  . Foot Pain     HPI Comments: Patient awoke yesterday with a sore spot on the dorsum of her right foot.  Since then her foot has become erythematous and warm.  She denies trauma.  No fevers, chills, and sweats.  She had a similar pain in the dorsum of her left foot in January 2015.  At that time an X-ray was negative.  A d-dimer was elevated and she was sent to ER to rule out DVT, but venous doppler ultrasound was negative.  She was prescribed and antibiotic without improvement.  Finally, she was placed in a cam walker and improved after about 10 days.         The history is provided by the patient.    Past Medical History  Diagnosis Date  . Dyskinesia of esophagus   . Uterine cancer   . Kidney stone    Past Surgical History  Procedure Laterality Date  . Abdominal hysterectomy      Uterine cancer  . Back surgery     Family History  Problem Relation Age of Onset  . Hypertension Father   . Hyperlipidemia Father   . Heart attack Father   . Stroke Mother   . Heart attack Brother   . Heart attack Brother   . Colon cancer Sister   . Breast cancer     History  Substance Use Topics  . Smoking status: Former Research scientist (life sciences)  . Smokeless tobacco: Not on file  . Alcohol Use: No   OB History    No data available     Review of Systems  Constitutional: Negative for fever, chills, diaphoresis and fatigue.  HENT: Negative.   Eyes: Negative.   Respiratory: Negative for cough, chest tightness, shortness of breath and wheezing.   Cardiovascular: Positive for leg swelling. Negative for chest pain and palpitations.  Genitourinary: Negative.   Musculoskeletal:       Left foot swelling, pain, and warmth.  Skin: Positive for color change.  Neurological: Negative for headaches.  All other systems reviewed and are  negative.   Allergies  Aspirin; Cisapride; Imipramine hcl; Penicillins; and Sulfonamide derivatives  Home Medications   Prior to Admission medications   Medication Sig Start Date End Date Taking? Authorizing Provider  albuterol (PROAIR HFA) 108 (90 BASE) MCG/ACT inhaler Inhale 2 puffs into the lungs every 6 (six) hours as needed for wheezing. 11/23/13   Hali Marry, MD  amLODipine (NORVASC) 5 MG tablet TAKE ONE TABLET DAILY 11/30/14   Hali Marry, MD  antipyrine-benzocaine Toniann Fail) otic solution 3 drops in affected ear(s) every 1 or 2 hours as needed for pain 11/10/14   Jacqulyn Cane, MD  cefdinir (OMNICEF) 300 MG capsule Take 1 capsule (300 mg total) by mouth 2 (two) times daily. 11/29/14   Hali Marry, MD  cetirizine (ZYRTEC ALLERGY) 10 MG tablet Take 1 tablet (10 mg total) by mouth daily. 11/28/12   Jade L Breeback, PA-C  fluticasone (FLONASE) 50 MCG/ACT nasal spray Place 2 sprays into both nostrils daily. 05/28/14   Jade L Breeback, PA-C  metoprolol (LOPRESSOR) 50 MG tablet TAKE ONE TABLET TWICE DAILY 12/03/14   Hali Marry, MD  Multiple Vitamins-Minerals (CENTRUM SILVER ULTRA WOMENS PO) Take by mouth.    Historical Provider, MD  omeprazole ([place])  40 MG capsule TAKE ONE CAPSULE DAILY 10/31/14   Hali Marry, MD  QVAR 40 MCG/ACT inhaler Inhale 2 puffs into the lungs 2 (two) times daily. 05/28/14   Jade L Breeback, PA-C  vitamin B-12 (CYANOCOBALAMIN) 100 MCG tablet Take 0.5 tablets (50 mcg total) by mouth daily. 05/15/13   Jade L Breeback, PA-C   BP 150/78 mmHg  Pulse 73  Temp(Src) 97.6 F (36.4 C) (Oral)  Ht 5\' 3"  (1.6 m)  Wt 129 lb (58.514 kg)  BMI 22.86 kg/m2  SpO2 97% Physical Exam  Constitutional: She is oriented to person, place, and time. She appears well-developed and well-nourished. No distress.  HENT:  Mouth/Throat: Oropharynx is clear and moist.  Eyes: Conjunctivae are normal. Pupils are equal, round, and reactive to light.   Neck: Neck supple. JVD present.  Cardiovascular: Normal heart sounds.   Pulses:      Dorsalis pedis pulses are 2+ on the right side, and 2+ on the left side.       Posterior tibial pulses are 2+ on the right side, and 2+ on the left side.  Pulmonary/Chest: Breath sounds normal.  Abdominal: There is no tenderness.  Musculoskeletal:       Right foot: There is tenderness and swelling. There is no bony tenderness and normal capillary refill.       Feet:  Dorsum of right foot reveals a tender palpable 1.5cm long cord along the course of a superficial vein as noted on diagram.  Dorsum of right foot is mildly erythematous, edematous, and warm.  Tenderness to palpation is only present on the dorsum as noted on diagram.      Lymphadenopathy:    She has no cervical adenopathy.  Neurological: She is alert and oriented to person, place, and time.  Skin: Skin is warm and dry.  Nursing note and vitals reviewed.   ED Course  Procedures  None    Labs Reviewed  URIC ACID  POCT CBC W AUTO DIFF ([facility]):  WBC 11.9; LY 40.1; MO 8.1; GR 51.8; Hgb 14.1; Platelets 256     Imaging Review Dg Foot Complete Right  12/28/2014   CLINICAL DATA:  One day history of pain, redness, and swelling. No history of injury.  EXAM: RIGHT FOOT COMPLETE - 3+ VIEW  COMPARISON:  None.  FINDINGS: Frontal, oblique, and lateral views obtained. There is no fracture or dislocation. Joint spaces appear intact. No erosive change.  IMPRESSION: No fracture or dislocation.  No appreciable arthropathic change.   Electronically Signed   By: Lowella Grip III M.D.   On: 12/28/2014 11:49     MDM   1. Pain in joint, ankle and foot, left   2. Superficial thrombophlebitis    Note that patient's symptoms are similar to previous episode one year ago with a negative workup at that time.  Her present exam is most consistent with a small superficial thrombophlebitis on the dorsum of her foot.  Take Ibuprofen 200mg , 3  tabs every 8 hours with food.  Wear ace wrap on foot and ankle daytime (recommend obtaining below knee medical support hose).  Elevate foot.  Apply heating pad several times daily.  May take Tylenol as needed for pain. If symptoms become significantly worse during the night or over the weekend, proceed to the local emergency room.  Followup with Family Doctor in five days    Kandra Nicolas, MD 01/03/15 1257

## 2014-12-28 NOTE — Discharge Instructions (Signed)
Take Ibuprofen 200mg , 3 tabs every 8 hours with food.  Wear ace wrap on foot and ankle daytime (recommend obtaining below knee medical support hose).  Elevate foot.  Apply heating pad several times daily.  May take Tylenol as needed for pain. If symptoms become significantly worse during the night or over the weekend, proceed to the local emergency room.    Phlebitis Phlebitis is soreness and swelling (inflammation) of a vein. This can occur in your arms, legs, or torso (trunk), as well as deeper inside your body. Phlebitis is usually not serious when it occurs close to the surface of the body. However, it can cause serious problems when it occurs in a vein deeper inside the body. CAUSES  Phlebitis can be triggered by various things, including:   Reduced blood flow through your veins. This can happen with:  Bed rest over a long period.  Long-distance travel.  Injury.  Surgery.  Being overweight (obese) or pregnant.  Having an IV tube put in the vein and getting certain medicines through the vein.  Cancer and cancer treatment.  Use of illegal drugs taken through the vein.  Inflammatory diseases.  Inherited (genetic) diseases that increase the risk of blood clots.  Hormone therapy, such as birth control pills. SIGNS AND SYMPTOMS   Red, tender, swollen, and painful area on your skin. Usually, the area will be long and narrow.  Firmness along the center of the affected area. This can indicate that a blood clot has formed.  Low-grade fever. DIAGNOSIS  A health care provider can usually diagnose phlebitis by examining the affected area and asking about your symptoms. To check for infection or blood clots, your health care provider may order blood tests or an ultrasound exam of the area. Blood tests and your family history may also indicate if you have an underlying genetic disease that causes blood clots. Occasionally, a piece of tissue is taken from the body (biopsy sample) if an  unusual cause of phlebitis is suspected. TREATMENT  Treatment will vary depending on the severity of the condition and the area of the body affected. Treatment may include:  Use of a warm compress or heating pad.  Use of compression stockings or bandages.  Anti-inflammatory medicines.  Removal of any IV tube that may be causing the problem.  Medicines that kill germs (antibiotics) if an infection is present.  Blood-thinning medicines if a blood clot is suspected or present.  In rare cases, surgery may be needed to remove damaged sections of vein. HOME CARE INSTRUCTIONS   Only take over-the-counter or prescription medicines as directed by your health care provider. Take all medicines exactly as prescribed.  Raise (elevate) the affected area above the level of your heart as directed by your health care provider.  Apply a warm compress or heating pad to the affected area as directed by your health care provider. Do not sleep with the heating pad.  Use compression stockings or bandages as directed. These will speed healing and prevent the condition from coming back.  If you are on blood thinners:  Get follow-up blood tests as directed by your health care provider.  Check with your health care provider before using any new medicines.  Carry a medical alert card or wear your medical alert jewelry to show that you are on blood thinners.  For phlebitis in the legs:  Avoid prolonged standing or bed rest.  Keep your legs moving. Raise your legs when sitting or lying.  Do not smoke.  Women, particularly those over the age of 108, should consider the risks and benefits of taking the contraceptive pill. This kind of hormone treatment can increase your risk for blood clots.  Follow up with your health care provider as directed. SEEK MEDICAL CARE IF:   You have unusual bruising or any bleeding problems.  Your swelling or pain in the affected area is not improving.  You are on  anti-inflammatory medicine, and you develop belly (abdominal) pain. SEEK IMMEDIATE MEDICAL CARE IF:   You have a sudden onset of chest pain or difficulty breathing.  You have a fever or persistent symptoms for more than 2-3 days.  You have a fever and your symptoms suddenly get worse. MAKE SURE YOU:  Understand these instructions.  Will watch your condition.  Will get help right away if you are not doing well or get worse. Document Released: 07/14/2001 Document Revised: 05/10/2013 Document Reviewed: 03/27/2013 [facility] Patient Information 2015 [place], [place]. This information is not intended to replace advice given to you by your health care provider. Make sure you discuss any questions you have with your health care provider.

## 2014-12-29 LAB — URIC ACID: Uric Acid, Serum: 5.1 mg/dL (ref 2.4–7.0)

## 2015-01-03 ENCOUNTER — Telehealth: Payer: Self-pay | Admitting: *Deleted

## 2015-01-03 NOTE — Telephone Encounter (Signed)
Error

## 2015-01-10 ENCOUNTER — Encounter: Payer: Self-pay | Admitting: Family Medicine

## 2015-01-10 ENCOUNTER — Ambulatory Visit (INDEPENDENT_AMBULATORY_CARE_PROVIDER_SITE_OTHER): Payer: Medicare Other | Admitting: Family Medicine

## 2015-01-10 VITALS — BP 128/71 | HR 69 | Ht 63.0 in | Wt 132.0 lb

## 2015-01-10 DIAGNOSIS — I809 Phlebitis and thrombophlebitis of unspecified site: Secondary | ICD-10-CM | POA: Diagnosis not present

## 2015-01-10 DIAGNOSIS — M25569 Pain in unspecified knee: Secondary | ICD-10-CM

## 2015-01-10 MED ORDER — BECLOMETHASONE DIPROPIONATE 40 MCG/ACT IN AERS
INHALATION_SPRAY | RESPIRATORY_TRACT | Status: DC
Start: 2015-01-10 — End: 2015-11-11

## 2015-01-10 MED ORDER — OMEPRAZOLE 40 MG PO CPDR: 40.0000 mg | DELAYED_RELEASE_CAPSULE | Freq: Every day | ORAL | Status: DC

## 2015-01-10 MED ORDER — AMLODIPINE BESYLATE 5 MG PO TABS: 5.0000 mg | ORAL_TABLET | Freq: Every day | ORAL | Status: DC

## 2015-01-10 MED ORDER — FLUTICASONE PROPIONATE 50 MCG/ACT NA SUSP: 1.0000 | Freq: Every day | NASAL | Status: DC

## 2015-01-10 NOTE — Progress Notes (Signed)
   Subjective:    Patient ID: Erika Lynch, female    DOB: 1933-07-18, 81 y.o.   MRN: [id number]  HPI Patient complains of both feet feeling hot and swollen. She had similar symptoms about a year ago. She went to urgent care about 2 weeks ago and was told that she had superficial thrombophlebitis. She was told to take at the present, elevated and keep it wrapped with an Ace wrap. They did check a uric acid level which was normal at 5.1. White blood cell count was mildly elevated at 11.9.  Has had sharp pain in her upper thigh that las a second. Cause her leg to jerk.  No know triggers. She says it doesn't feel like a muscle spasm.  No trauma or injury.  Not related to position.  No weakness.     Review of Systems     Objective:   Physical Exam  Constitutional: She appears well-nourished.  Musculoskeletal:       Feet:  Ankles and legs are nontender. Negative calf squeeze bilaterally. No ankle swelling. No tenderness over the upper thighs.  Skin: Skin is warm and dry. She is not diaphoretic.  Psychiatric: She has a normal mood and affect. Her behavior is normal.          Assessment & Plan:  Superficial thrombophlebitis of the right foot-significantly improved. Continue regimen of ibuprofen, compression and elevation for the next week. Return sooner if any signs of worsening or not completely healing.  Sharp pain in upper thighs-unclear etiology. I really don't have a good explanation for her pain. She is due to recheck her electrolytes and kidney and liver function so we will do that today.

## 2015-01-11 LAB — COMPLETE METABOLIC PANEL WITH GFR
ALBUMIN: 4.1 g/dL (ref 3.5–5.2)
ALT: 9 U/L (ref 0–35)
AST: 15 U/L (ref 0–37)
Alkaline Phosphatase: 57 U/L (ref 39–117)
BILIRUBIN TOTAL: 0.3 mg/dL (ref 0.2–1.2)
BUN: 17 mg/dL (ref 6–23)
CALCIUM: 9.4 mg/dL (ref 8.4–10.5)
CHLORIDE: 102 meq/L (ref 96–112)
CO2: 26 meq/L (ref 19–32)
Creat: 1.2 mg/dL — ABNORMAL HIGH (ref 0.50–1.10)
GFR, EST NON AFRICAN AMERICAN: 43 mL/min — AB
GFR, Est African American: 50 mL/min — ABNORMAL LOW
Glucose, Bld: 84 mg/dL (ref 70–99)
Potassium: 4.2 mEq/L (ref 3.5–5.3)
Sodium: 137 mEq/L (ref 135–145)
TOTAL PROTEIN: 6.9 g/dL (ref 6.0–8.3)

## 2015-01-30 ENCOUNTER — Other Ambulatory Visit: Payer: Self-pay | Admitting: Family Medicine

## 2015-02-14 ENCOUNTER — Encounter: Payer: Self-pay | Admitting: Gastroenterology

## 2015-02-28 ENCOUNTER — Other Ambulatory Visit: Payer: Self-pay | Admitting: Family Medicine

## 2015-04-29 ENCOUNTER — Other Ambulatory Visit: Payer: Self-pay | Admitting: Family Medicine

## 2015-05-13 ENCOUNTER — Ambulatory Visit (INDEPENDENT_AMBULATORY_CARE_PROVIDER_SITE_OTHER): Payer: Medicare Other | Admitting: Family Medicine

## 2015-05-13 ENCOUNTER — Encounter: Payer: Self-pay | Admitting: Family Medicine

## 2015-05-13 VITALS — BP 128/80 | HR 63 | Wt 126.7 lb

## 2015-05-13 DIAGNOSIS — M5440 Lumbago with sciatica, unspecified side: Secondary | ICD-10-CM | POA: Diagnosis not present

## 2015-05-13 DIAGNOSIS — I10 Essential (primary) hypertension: Secondary | ICD-10-CM

## 2015-05-13 DIAGNOSIS — N183 Chronic kidney disease, stage 3 unspecified: Secondary | ICD-10-CM

## 2015-05-13 DIAGNOSIS — H9202 Otalgia, left ear: Secondary | ICD-10-CM

## 2015-05-13 DIAGNOSIS — Z23 Encounter for immunization: Secondary | ICD-10-CM | POA: Diagnosis not present

## 2015-05-13 MED ORDER — OMEPRAZOLE 40 MG PO CPDR: 40.0000 mg | DELAYED_RELEASE_CAPSULE | Freq: Every day | ORAL | Status: DC

## 2015-05-13 MED ORDER — AMLODIPINE BESYLATE 5 MG PO TABS: 5.0000 mg | ORAL_TABLET | Freq: Every day | ORAL | Status: DC

## 2015-05-13 MED ORDER — METOPROLOL TARTRATE 50 MG PO TABS: 50.0000 mg | ORAL_TABLET | Freq: Two times a day (BID) | ORAL | Status: DC

## 2015-05-13 NOTE — Progress Notes (Signed)
   Subjective:    Patient ID: Erika Lynch, female    DOB: [birth year], 81 y.o.   MRN: [id number]  HPI Hypertension- Pt denies chest pain, SOB, dizziness, or heart palpitations.  Taking meds as directed w/o problems.  Denies medication side effects.    Elevated Cr at last OV.    Had flu shot done at the pharmacy  Due fore prevnar 13 vaccine.   Has been having some problems with the left ear.  Says had 2 antiobic for an ear infection in that ear.  She says the pain is gone but still gets dizzy occasionally. They've recommended that she have hearing testing done and consider even getting a hearing aid dependent on test results.  Low back has been bothering her as well. Radiates down bilaterally.  She is still doing therapy. MRI in 2013 showed central canal stenosis and has some dic bulge.       Review of Systems     Objective:   Physical Exam  Constitutional: She is oriented to person, place, and time. She appears well-developed and well-nourished.  HENT:  Head: Normocephalic and atraumatic.  Cardiovascular: Normal rate, regular rhythm and normal heart sounds.   Pulmonary/Chest: Effort normal and breath sounds normal.  Neurological: She is alert and oriented to person, place, and time.  Skin: Skin is warm and dry.  Psychiatric: She has a normal mood and affect. Her behavior is normal.          Assessment & Plan:  HTN -  Repeat pressure was high. She did not take her medication this morning she says she was fasting. Most likely why it elevated today but normally well controlled.  CKD 3 - discussed diagnosis and the fact that she does have some mild renal impairment and has since at least 2009. due to recheck kidney function and perform urine microalbumin.   Low back pain with spinal stenosis - offered to refer her to ortho.  She says she will think about it. She did home physical therapy a few years ago and says she still does the exercises on her on home.  Left ear  pain-following with ENT. Hopefully getting formal evaluation for hearing aids.

## 2015-05-14 ENCOUNTER — Telehealth: Payer: Self-pay

## 2015-05-14 LAB — LIPID PANEL
Cholesterol: 259 mg/dL — ABNORMAL HIGH (ref 125–200)
HDL: 40 mg/dL — ABNORMAL LOW (ref >=46)
LDL Cholesterol: 175 mg/dL — ABNORMAL HIGH (ref <130)
Total CHOL/HDL Ratio: 6.5 Ratio — ABNORMAL HIGH (ref <=5.0)
Triglycerides: 222 mg/dL — ABNORMAL HIGH (ref <150)
VLDL: 44 mg/dL — ABNORMAL HIGH (ref <30)

## 2015-05-14 LAB — COMPLETE METABOLIC PANEL WITH GFR
ALT: 11 U/L (ref 6–29)
AST: 15 U/L (ref 10–35)
Albumin: 4.5 g/dL (ref 3.6–5.1)
Alkaline Phosphatase: 58 U/L (ref 33–130)
BUN: 12 mg/dL (ref 7–25)
CALCIUM: 10.5 mg/dL — AB (ref 8.6–10.4)
CO2: 26 mmol/L (ref 20–31)
CREATININE: 1.03 mg/dL — AB (ref 0.60–0.88)
Chloride: 102 mmol/L (ref 98–110)
GFR, Est African American: 59 mL/min — ABNORMAL LOW (ref >=60)
GFR, Est Non African American: 51 mL/min — ABNORMAL LOW (ref >=60)
GLUCOSE: 101 mg/dL — AB (ref 65–99)
POTASSIUM: 4.7 mmol/L (ref 3.5–5.3)
SODIUM: 139 mmol/L (ref 135–146)
Total Bilirubin: 0.6 mg/dL (ref 0.2–1.2)
Total Protein: 7.1 g/dL (ref 6.1–8.1)

## 2015-05-14 MED ORDER — PRAVASTATIN SODIUM 20 MG PO TABS: 20.0000 mg | ORAL_TABLET | Freq: Every day | ORAL | Status: DC

## 2015-05-14 NOTE — Addendum Note (Signed)
Addended by: Beatrice Lecher D on: 05/14/2015 01:45 PM   Modules accepted: Orders

## 2015-05-14 NOTE — Telephone Encounter (Signed)
Left msg on VM.

## 2015-05-14 NOTE — Telephone Encounter (Signed)
-----   Message from Hali Marry, MD sent at 05/14/2015  7:51 AM EDT ----- Call patient: Completely about panel is okay. Kidney function is mildly impaired but stable.  Total cholesterol and LDL are quite elevated. They are up from last time. I would still encourage her to consider starting a cholesterol lowering medication at bedtime. That is ultimately up to her.

## 2015-07-01 ENCOUNTER — Other Ambulatory Visit: Payer: Self-pay | Admitting: Physician Assistant

## 2015-07-01 ENCOUNTER — Other Ambulatory Visit: Payer: Self-pay | Admitting: Family Medicine

## 2015-07-31 ENCOUNTER — Other Ambulatory Visit: Payer: Self-pay | Admitting: Family Medicine

## 2015-08-28 ENCOUNTER — Other Ambulatory Visit: Payer: Self-pay | Admitting: Family Medicine

## 2015-10-03 ENCOUNTER — Other Ambulatory Visit: Payer: Self-pay | Admitting: Family Medicine

## 2015-10-28 ENCOUNTER — Other Ambulatory Visit: Payer: Self-pay | Admitting: Family Medicine

## 2015-11-11 ENCOUNTER — Ambulatory Visit (INDEPENDENT_AMBULATORY_CARE_PROVIDER_SITE_OTHER): Payer: Medicare Other | Admitting: Family Medicine

## 2015-11-11 ENCOUNTER — Encounter: Payer: Self-pay | Admitting: Family Medicine

## 2015-11-11 VITALS — BP 137/60 | HR 63 | Wt 131.0 lb

## 2015-11-11 DIAGNOSIS — R319 Hematuria, unspecified: Secondary | ICD-10-CM

## 2015-11-11 DIAGNOSIS — N183 Chronic kidney disease, stage 3 unspecified: Secondary | ICD-10-CM

## 2015-11-11 DIAGNOSIS — M5432 Sciatica, left side: Secondary | ICD-10-CM

## 2015-11-11 DIAGNOSIS — I10 Essential (primary) hypertension: Secondary | ICD-10-CM | POA: Diagnosis not present

## 2015-11-11 DIAGNOSIS — K2100 Gastro-esophageal reflux disease with esophagitis, without bleeding: Secondary | ICD-10-CM

## 2015-11-11 DIAGNOSIS — R209 Unspecified disturbances of skin sensation: Secondary | ICD-10-CM

## 2015-11-11 DIAGNOSIS — E538 Deficiency of other specified B group vitamins: Secondary | ICD-10-CM

## 2015-11-11 DIAGNOSIS — R6889 Other general symptoms and signs: Secondary | ICD-10-CM | POA: Diagnosis not present

## 2015-11-11 LAB — POCT URINALYSIS DIPSTICK
BILIRUBIN UA: NEGATIVE
GLUCOSE UA: NEGATIVE
KETONES UA: NEGATIVE
Nitrite, UA: NEGATIVE
Protein, UA: NEGATIVE
SPEC GRAV UA: 1.01
Urobilinogen, UA: 0.2
pH, UA: 5.5

## 2015-11-11 MED ORDER — VITAMIN B-12 100 MCG PO TABS: 50.0000 ug | ORAL_TABLET | Freq: Every day | ORAL | Status: DC

## 2015-11-11 MED ORDER — PRAVASTATIN SODIUM 20 MG PO TABS: 20.0000 mg | ORAL_TABLET | Freq: Every day | ORAL | Status: DC

## 2015-11-11 MED ORDER — PREDNISONE 20 MG PO TABS: 40.0000 mg | ORAL_TABLET | Freq: Every day | ORAL | Status: DC

## 2015-11-11 MED ORDER — METOPROLOL TARTRATE 50 MG PO TABS: 50.0000 mg | ORAL_TABLET | Freq: Two times a day (BID) | ORAL | Status: DC

## 2015-11-11 MED ORDER — FLUTICASONE PROPIONATE 50 MCG/ACT NA SUSP: 1.0000 | Freq: Every day | NASAL | Status: DC

## 2015-11-11 MED ORDER — OMEPRAZOLE 40 MG PO CPDR: 40.0000 mg | DELAYED_RELEASE_CAPSULE | Freq: Every day | ORAL | Status: DC

## 2015-11-11 MED ORDER — AMLODIPINE BESYLATE 5 MG PO TABS: 5.0000 mg | ORAL_TABLET | Freq: Every day | ORAL | Status: DC

## 2015-11-11 MED ORDER — BECLOMETHASONE DIPROPIONATE 40 MCG/ACT IN AERS: INHALATION_SPRAY | RESPIRATORY_TRACT | Status: DC

## 2015-11-11 NOTE — Progress Notes (Signed)
   Subjective:    Patient ID: Erika Lynch, female    DOB: 06/28/1933, 82 y.o.   MRN: [id number]  HPI Hypertension- Pt denies chest pain, SOB, dizziness, or heart palpitations.  Taking meds as directed w/o problems.  Denies medication side effects.    Last week her entire left leg went numb.  Her heesl fees like it is pulsing mostly at night.  Her hips feel like that too. She does have a known history of lumbar spinal stenosis, due to disc bulge. She says now her entire leg isn't numb she just feels it mostly on the outside of the leg down to the outer portion of the foot. She's also had some low back pain directly under her surgical incision.   has had some suprapubic pressure for the last couple of days. She wonders if she might be getting a urinary tract infection. Though she denies any hematuria or dysuria or frequency. No fevers.  She does complain of feeling cold constantly. She says that since going on for the last couple of years.   Review of Systems     Objective:   Physical Exam  Constitutional: She is oriented to person, place, and time. She appears well-developed and well-nourished.  HENT:  Head: Normocephalic and atraumatic.  Neck: Neck supple. No thyromegaly present.  Cardiovascular: Normal rate, regular rhythm and normal heart sounds.   Pulmonary/Chest: Effort normal and breath sounds normal.  Musculoskeletal:  Mom is tender over the lumbar spine. Also tender over the left buttock area. Normal hip, knee, ankle strength bilaterally. Positive straight leg raise on the left side.  Lymphadenopathy:    She has no cervical adenopathy.  Neurological: She is alert and oriented to person, place, and time.  Skin: Skin is warm and dry.  Psychiatric: She has a normal mood and affect. Her behavior is normal.          Assessment & Plan:  HTN - Well controlled. Continue current regimen. Follow up in 6 mo  CKD 3  - due to recheck function.   Feeling cold-we'll recheck  thyroid level. This could also be a circulation issue.  Suprapubic pain- will check UA.  Positive for leukocytes and trace blood. We'll send for culture for further evaluation.  Sciatica, left side-we'll treat with 5 days of oral prednisone. If not improving consider formal physical therapy. During this intact for don't think we need to repeat in the imaging today.

## 2015-11-12 LAB — BASIC METABOLIC PANEL
BUN: 14 mg/dL (ref 7–25)
CO2: 24 mmol/L (ref 20–31)
Calcium: 9.6 mg/dL (ref 8.6–10.4)
Chloride: 107 mmol/L (ref 98–110)
Creat: 1.1 mg/dL — ABNORMAL HIGH (ref 0.60–0.88)
Glucose, Bld: 84 mg/dL (ref 65–99)
POTASSIUM: 4.4 mmol/L (ref 3.5–5.3)
Sodium: 140 mmol/L (ref 135–146)

## 2015-11-12 LAB — TSH: TSH: 2.78 m[IU]/L

## 2015-11-12 LAB — VITAMIN B12: VITAMIN B 12: 540 pg/mL (ref 200–1100)

## 2015-11-13 LAB — URINE CULTURE
Colony Count: NO GROWTH
ORGANISM ID, BACTERIA: NO GROWTH

## 2015-11-19 ENCOUNTER — Telehealth: Payer: Self-pay | Admitting: *Deleted

## 2015-11-19 NOTE — Telephone Encounter (Signed)
PA initiated through covermymeds for Qvar

## 2015-11-20 NOTE — Telephone Encounter (Signed)
Denied by insurance. They will fax or mail letter.

## 2015-11-28 ENCOUNTER — Encounter: Payer: Self-pay | Admitting: Family Medicine

## 2015-11-28 ENCOUNTER — Ambulatory Visit (INDEPENDENT_AMBULATORY_CARE_PROVIDER_SITE_OTHER): Payer: Medicare Other | Admitting: Family Medicine

## 2015-11-28 ENCOUNTER — Ambulatory Visit (INDEPENDENT_AMBULATORY_CARE_PROVIDER_SITE_OTHER): Payer: Medicare Other

## 2015-11-28 VITALS — BP 122/64 | HR 77 | Wt 127.0 lb

## 2015-11-28 DIAGNOSIS — D229 Melanocytic nevi, unspecified: Secondary | ICD-10-CM

## 2015-11-28 DIAGNOSIS — R1030 Lower abdominal pain, unspecified: Secondary | ICD-10-CM | POA: Diagnosis not present

## 2015-11-28 DIAGNOSIS — N200 Calculus of kidney: Secondary | ICD-10-CM

## 2015-11-28 DIAGNOSIS — D239 Other benign neoplasm of skin, unspecified: Secondary | ICD-10-CM | POA: Diagnosis not present

## 2015-11-28 DIAGNOSIS — R3989 Other symptoms and signs involving the genitourinary system: Secondary | ICD-10-CM

## 2015-11-28 DIAGNOSIS — R319 Hematuria, unspecified: Secondary | ICD-10-CM | POA: Diagnosis not present

## 2015-11-28 DIAGNOSIS — N3289 Other specified disorders of bladder: Secondary | ICD-10-CM

## 2015-11-28 LAB — POCT URINALYSIS DIPSTICK
Bilirubin, UA: NEGATIVE
Glucose, UA: NEGATIVE
Ketones, UA: NEGATIVE
NITRITE UA: NEGATIVE
PH UA: 5.5
Protein, UA: NEGATIVE
Spec Grav, UA: 1.02
UROBILINOGEN UA: 0.2

## 2015-11-28 NOTE — Progress Notes (Signed)
Subjective:    Patient ID: Erika Lynch, female    DOB: 1933-03-20, 82 y.o.   MRN: [id number]  HPI She is here today have a lesion removed from her right forearm. She says initially it was a little bit larger and it seems to have gone down a little bit but she is and her daughter are both worried about the appearance. It does not bother her in any way. It's not painful irritated or itchy.  Patient complains of persistent suprapubic pain for at least the last 2-1/2 weeks. In fact she had mentioned that at the last office visit and we did a urinalysis and a urine culture which was negative. She says since then she's actually starting to get burning with urination. She denies any rashes in the groin her vaginal area. She says it actually seems to start her back and then radiate towards her pelvis. It is worse on the right compared to the left side. She denies any nausea or vomiting. She says it's worse if she hits about my riding in the car. And it does feel a little bit better with a heating pad. No hematuria or urgency.    Review of Systems  BP 122/64 mmHg  Pulse 77  Wt 127 lb (57.607 kg)  SpO2 98%    Allergies  Allergen Reactions  . Aspirin   . Cisapride   . Imipramine Hcl   . Penicillins Rash  . Sulfonamide Derivatives Rash    Past Medical History  Diagnosis Date  . Dyskinesia of esophagus   . Uterine cancer ([place])   . Kidney stone     Past Surgical History  Procedure Laterality Date  . Abdominal hysterectomy      Uterine cancer  . Back surgery      Social History   Social History  . Marital Status: Single    Spouse Name: N/A  . Number of Children: 4  . Years of Education: N/A   Occupational History  . Retired.     Social History Main Topics  . Smoking status: Former Research scientist (life sciences)  . Smokeless tobacco: Not on file  . Alcohol Use: No  . Drug Use: No  . Sexual Activity: Not on file   Other Topics Concern  . Not on file   Social History Narrative   3 living  children: [name], Danny.  Daily caffeine use. Regular exercise.     Family History  Problem Relation Age of Onset  . Hypertension Father   . Hyperlipidemia Father   . Heart attack Father   . Stroke Mother   . Heart attack Brother   . Heart attack Brother   . Colon cancer Sister   . Breast cancer      Outpatient Encounter Prescriptions as of 11/28/2015  Medication Sig  . albuterol (PROAIR HFA) 108 (90 BASE) MCG/ACT inhaler Inhale 2 puffs into the lungs every 6 (six) hours as needed for wheezing.  Marland Kitchen amLODipine (NORVASC) 5 MG tablet Take 1 tablet (5 mg total) by mouth daily.  . beclomethasone (QVAR) 40 MCG/ACT inhaler Inhale 2 puffs into the lungs 2 (two) times daily.  . fluticasone (FLONASE) 50 MCG/ACT nasal spray Place 1 spray into both nostrils daily.  Marland Kitchen ibuprofen (ADVIL,MOTRIN) 200 MG tablet Take 200 mg by mouth every 6 (six) hours as needed.  . metoprolol (LOPRESSOR) 50 MG tablet Take 1 tablet (50 mg total) by mouth 2 (two) times daily.  Marland Kitchen omeprazole (PRILOSEC) 40 MG capsule Take 1 capsule (  40 mg total) by mouth daily.  . pravastatin (PRAVACHOL) 20 MG tablet Take 1 tablet (20 mg total) by mouth at bedtime.  . vitamin B-12 (CYANOCOBALAMIN) 100 MCG tablet Take 0.5 tablets (50 mcg total) by mouth daily.  . [DISCONTINUED] predniSONE (DELTASONE) 20 MG tablet Take 2 tablets (40 mg total) by mouth daily.   No facility-administered encounter medications on file as of 11/28/2015.          Objective:   Physical Exam  Constitutional: She is oriented to person, place, and time. She appears well-developed and well-nourished.  HENT:  Head: Normocephalic and atraumatic.  Eyes: Conjunctivae and EOM are normal.  Cardiovascular: Normal rate.   Pulmonary/Chest: Effort normal.  Abdominal: Soft. Bowel sounds are normal. She exhibits no distension and no mass. There is tenderness. There is no rebound and no guarding.  TTP over entire lower abdomen, epigastric area, LUQ.    Neurological:  She is alert and oriented to person, place, and time.  Skin: Skin is dry. No pallor.   7 x 7 mm lesion with some central hyperpigmentation on the right forearm near the antecubital fossa.   Psychiatric: She has a normal mood and affect. Her behavior is normal.  Vitals reviewed.      Assessment & Plan:  Lower abdominal pain - Unclear etiology at this point. She is tender over most of the abdomen except the right upper quadrant. No rebound or guarding. Pain has been persistent. Urinalysis just shows trace leukocytes. We'll send for culture for further evaluation. Will start with a KUB of abdomen today just to rule out constipation or obstruction of stool. Check CBC, CMP.  Consider CT if not improving.  Consdier diverticulitis, colon cancer, renal cancer etc.  + fam hx in first degree relative for colon ca. Last scope in 2009.   Atpyical nevus - bx performed. Patient tolerated well.   Shave Biopsy Procedure Note  Pre-operative Diagnosis: Suspicious lesion  Post-operative Diagnosis: same  Locations:right forearm, near elbow, lesion 7 x 7 mm  Indications: change in size  Anesthesia: Lidocaine 1% with epinephrine without added sodium bicarbonate  Procedure Details  Patient informed of the risks (including bleeding and infection) and benefits of the  procedure and Verbal informed consent obtained.  The lesion and surrounding area were given a sterile prep using chlorhexidine and draped in the usual sterile fashion. A scalpel was used to shave an area of skin approximately 74mm by 29mm.  Hemostasis achieved with alumuninum chloride. Antibiotic ointment and a sterile dressing applied.  The specimen was sent for pathologic examination. The patient tolerated the procedure well.  EBL: 0 ml  Findings: Await pathology  Condition: Stable  Complications: none.  Plan: 1. Instructed to keep the wound dry and covered for 24-48h and clean thereafter. 2. Warning signs of infection were reviewed.    3. Recommended that the patient use OTC acetaminophen as needed for pain.  4. Return PRN.

## 2015-11-28 NOTE — Patient Instructions (Signed)
Keep wound covered until tomorrow. After that he can remove the bandage. Okay to get it wet in the shower. Do not scrub the area. His let the water flow over and patent dry. Apply Vaseline twice a day for the next 2 weeks. If you notice any redness or if it looks worrisome to use them please let us know and we will get she in and take a look at it.

## 2015-11-29 LAB — COMPLETE METABOLIC PANEL WITH GFR
ALBUMIN: 3.7 g/dL (ref 3.6–5.1)
ALK PHOS: 57 U/L (ref 33–130)
ALT: 14 U/L (ref 6–29)
AST: 13 U/L (ref 10–35)
BILIRUBIN TOTAL: 0.3 mg/dL (ref 0.2–1.2)
BUN: 20 mg/dL (ref 7–25)
CO2: 22 mmol/L (ref 20–31)
Calcium: 9.3 mg/dL (ref 8.6–10.4)
Chloride: 104 mmol/L (ref 98–110)
Creat: 1.07 mg/dL — ABNORMAL HIGH (ref 0.60–0.88)
GFR, EST AFRICAN AMERICAN: 57 mL/min — AB (ref >=60)
GFR, EST NON AFRICAN AMERICAN: 49 mL/min — AB (ref >=60)
Glucose, Bld: 122 mg/dL — ABNORMAL HIGH (ref 65–99)
POTASSIUM: 4 mmol/L (ref 3.5–5.3)
Sodium: 139 mmol/L (ref 135–146)
TOTAL PROTEIN: 6.7 g/dL (ref 6.1–8.1)

## 2015-11-29 LAB — URINALYSIS, MICROSCOPIC ONLY
Bacteria, UA: NONE SEEN [HPF]
Casts: NONE SEEN [LPF]
Crystals: NONE SEEN [HPF]
Yeast: NONE SEEN [HPF]

## 2015-11-29 LAB — CBC WITH DIFFERENTIAL/PLATELET
BASOS ABS: 129 {cells}/uL (ref 0–200)
BASOS PCT: 1 %
EOS ABS: 903 {cells}/uL — AB (ref 15–500)
EOS PCT: 7 %
HCT: 42.1 % (ref 35.0–45.0)
HEMOGLOBIN: 13.9 g/dL (ref 11.7–15.5)
LYMPHS ABS: 4386 {cells}/uL — AB (ref 850–3900)
Lymphocytes Relative: 34 %
MCH: 32.3 pg (ref 27.0–33.0)
MCHC: 33 g/dL (ref 32.0–36.0)
MCV: 97.7 fL (ref 80.0–100.0)
MONOS PCT: 6 %
MPV: 9.9 fL (ref 7.5–12.5)
Monocytes Absolute: 774 cells/uL (ref 200–950)
NEUTROS ABS: 6708 {cells}/uL (ref 1500–7800)
Neutrophils Relative %: 52 %
PLATELETS: 385 10*3/uL (ref 140–400)
RBC: 4.31 MIL/uL (ref 3.80–5.10)
RDW: 13.5 % (ref 11.0–15.0)
WBC: 12.9 10*3/uL — ABNORMAL HIGH (ref 3.8–10.8)

## 2015-11-29 LAB — URINE CULTURE
Colony Count: NO GROWTH
ORGANISM ID, BACTERIA: NO GROWTH

## 2015-12-04 ENCOUNTER — Ambulatory Visit (HOSPITAL_BASED_OUTPATIENT_CLINIC_OR_DEPARTMENT_OTHER)
Admission: RE | Admit: 2015-12-04 | Discharge: 2015-12-04 | Disposition: A | Discharge status: Home / Self Care | Payer: Medicare Other | Source: Ambulatory Visit | Attending: Family Medicine | Admitting: Family Medicine

## 2015-12-04 ENCOUNTER — Encounter (HOSPITAL_BASED_OUTPATIENT_CLINIC_OR_DEPARTMENT_OTHER): Payer: Self-pay

## 2015-12-04 DIAGNOSIS — N200 Calculus of kidney: Secondary | ICD-10-CM | POA: Diagnosis not present

## 2015-12-04 DIAGNOSIS — M5136 Other intervertebral disc degeneration, lumbar region: Secondary | ICD-10-CM | POA: Insufficient documentation

## 2015-12-04 DIAGNOSIS — K5730 Diverticulosis of large intestine without perforation or abscess without bleeding: Secondary | ICD-10-CM | POA: Diagnosis not present

## 2015-12-04 DIAGNOSIS — M5137 Other intervertebral disc degeneration, lumbosacral region: Secondary | ICD-10-CM | POA: Insufficient documentation

## 2015-12-04 DIAGNOSIS — R1030 Lower abdominal pain, unspecified: Secondary | ICD-10-CM | POA: Diagnosis not present

## 2015-12-04 HISTORY — DX: Essential (primary) hypertension: I10

## 2015-12-04 MED ORDER — IOPAMIDOL (ISOVUE-300) INJECTION 61%
100.0000 mL | Freq: Once | INTRAVENOUS | Status: AC | PRN
  Administered 2015-12-04: 100 mL via INTRAVENOUS

## 2015-12-06 ENCOUNTER — Telehealth: Payer: Self-pay | Admitting: Family Medicine

## 2015-12-06 NOTE — Telephone Encounter (Signed)
Please call patient: Her insurance will not cover Qvar but they will pay for Asmanex or Flovent. Please see if she has a preference.

## 2015-12-09 NOTE — Telephone Encounter (Signed)
Pt stated that she doesn't have a preference. Which ever Dr. Madilyn Fireman feels is best.Magdalen Cabana, [place] Crocker

## 2015-12-11 ENCOUNTER — Ambulatory Visit (INDEPENDENT_AMBULATORY_CARE_PROVIDER_SITE_OTHER): Payer: Medicare Other | Admitting: Physician Assistant

## 2015-12-11 ENCOUNTER — Encounter: Payer: Self-pay | Admitting: Physician Assistant

## 2015-12-11 VITALS — BP 142/67 | HR 60 | Temp 97.5°F | Ht 63.0 in

## 2015-12-11 DIAGNOSIS — N390 Urinary tract infection, site not specified: Secondary | ICD-10-CM

## 2015-12-11 DIAGNOSIS — R350 Frequency of micturition: Secondary | ICD-10-CM | POA: Diagnosis not present

## 2015-12-11 DIAGNOSIS — R1115 Cyclical vomiting syndrome unrelated to migraine: Secondary | ICD-10-CM

## 2015-12-11 DIAGNOSIS — G43A1 Cyclical vomiting, intractable: Secondary | ICD-10-CM

## 2015-12-11 DIAGNOSIS — R42 Dizziness and giddiness: Secondary | ICD-10-CM

## 2015-12-11 DIAGNOSIS — E860 Dehydration: Secondary | ICD-10-CM

## 2015-12-11 DIAGNOSIS — R82998 Other abnormal findings in urine: Secondary | ICD-10-CM

## 2015-12-11 DIAGNOSIS — H8110 Benign paroxysmal vertigo, unspecified ear: Secondary | ICD-10-CM

## 2015-12-11 DIAGNOSIS — R3989 Other symptoms and signs involving the genitourinary system: Secondary | ICD-10-CM

## 2015-12-11 DIAGNOSIS — R112 Nausea with vomiting, unspecified: Secondary | ICD-10-CM | POA: Diagnosis not present

## 2015-12-11 LAB — POCT URINALYSIS DIPSTICK
BILIRUBIN UA: NEGATIVE
Blood, UA: NEGATIVE
GLUCOSE UA: NEGATIVE
KETONES UA: NEGATIVE
Nitrite, UA: NEGATIVE
Protein, UA: NEGATIVE
SPEC GRAV UA: 1.02
Urobilinogen, UA: 0.2
pH, UA: 7

## 2015-12-11 MED ORDER — PROMETHAZINE HCL 25 MG/ML IJ SOLN
25.0000 mg | Freq: Once | INTRAMUSCULAR | Status: AC
  Administered 2015-12-11: 25 mg via INTRAMUSCULAR

## 2015-12-11 MED ORDER — MECLIZINE HCL 25 MG PO TABS: 25.0000 mg | ORAL_TABLET | Freq: Three times a day (TID) | ORAL | Status: DC | PRN

## 2015-12-11 NOTE — Addendum Note (Signed)
Addended by: Donella Stade on: 12/11/2015 12:52 PM   Modules accepted: Orders

## 2015-12-11 NOTE — Telephone Encounter (Signed)
I sent referral to home health for evaluation tomorrow. Do not take antivert tomorrow.

## 2015-12-11 NOTE — Addendum Note (Signed)
Addended by: Beatris Ship L on: 12/11/2015 11:52 AM   Modules accepted: Orders

## 2015-12-11 NOTE — Addendum Note (Signed)
Addended by: Donella Stade on: 12/11/2015 11:57 AM   Modules accepted: Orders, Level of Service

## 2015-12-11 NOTE — Progress Notes (Addendum)
   Subjective:    I'm seeing this patient as a consultation for:  Iran Planas, PA-C  CC: Nausea vomiting dehydration  HPI: This is a pleasant 82-year-old female with persistent nausea, vomiting, she is dehydrated, dizzy, and unable to take oral rehydration. I was called for further evaluation as well as venous access.  Past medical history, Surgical history, Family history not pertinant except as noted below, Social history, Allergies, and medications have been entered into the medical record, reviewed, and no changes needed.   Review of Systems: No headache, visual changes, nausea, vomiting, diarrhea, constipation, dizziness, abdominal pain, skin rash, fevers, chills, night sweats, weight loss, swollen lymph nodes, body aches, joint swelling, muscle aches, chest pain, shortness of breath, mood changes, visual or auditory hallucinations.   Objective:   General: Well Developed, well nourished, and in no acute distress.  Neuro/Psych: Alert and oriented x3, extra-ocular muscles intact, able to move all 4 extremities, sensation grossly intact. Skin: Warm and dry, no rashes noted.  Respiratory: Not using accessory muscles, speaking in full sentences, trachea midline.  Cardiovascular: Pulses palpable, no extremity edema. Abdomen: Does not appear distended.  20-gauge angiocatheter placed into the left cubital vein, 1 L of normal saline run per primary treating provider.  Impression and Recommendations:   This case required medical decision making of moderate complexity.  1. Dehydration: 20-gauge angiocatheter placed in the left cubital vein, liter of normal saline run per primary treating provider. May return to see me on an as-needed basis.  ___________________________________________ Gwen Her. Dianah Field, M.D., ABFM., CAQSM. Primary Care and [place] [employer] of [place] [facility]

## 2015-12-11 NOTE — Addendum Note (Signed)
Addended by: Beatris Ship L on: 12/11/2015 01:50 PM   Modules accepted: Orders

## 2015-12-11 NOTE — Progress Notes (Addendum)
   Subjective:    Patient ID: Erika Lynch, female    DOB: [birth year], 82 y.o.   MRN: [id number]  HPI Patient is here today complaining of nausea, vomiting, and dizziness x1 day. She has had chills for the past two days. She has had increased urination for the past week and was evaluated for this problem 4/27/7 by Dr. Madilyn Fireman. She did have a CT scan which revealed no abnormal findings. UA and urine culture last week were negative. She does report some bladder pain last evening but that has resolved today. She still has some burning with urination.  She denies fever. She denies abdominal pain and cramping, denies back pain. She has vomited three times today.   Nausea is worse with laying down. She does have a history of inner ear problems in the past. She is having ringing in her left ear for the past two days but denies ear pain.   Review of Systems  Constitutional: Positive for chills. Negative for fever and fatigue.  HENT: Positive for ear pain (left). Negative for congestion.   Respiratory: Negative for cough, chest tightness, shortness of breath and wheezing.   Cardiovascular: Negative for chest pain.  Genitourinary: Positive for frequency and pelvic pain. Negative for dysuria and urgency.  Neurological: Positive for dizziness. Negative for headaches.  All other systems reviewed and are negative.      Objective:   Physical Exam  Constitutional: She is oriented to person, place, and time. She appears well-developed and well-nourished.  HENT:  Head: Normocephalic and atraumatic.  Eyes: Conjunctivae are normal. Right eye exhibits no discharge. Left eye exhibits no discharge.  Cardiovascular: Normal rate, regular rhythm and normal heart sounds.   Pulmonary/Chest: Effort normal.  Rhonci in all lung fields   Abdominal: Soft. There is no tenderness.  Lymphadenopathy:    She has no cervical adenopathy.  Neurological: She is alert and oriented to person, place, and time.  Skin: Skin  is warm and dry.  Psychiatric: She has a normal mood and affect. Her behavior is normal. Thought content normal.       Assessment & Plan:  1. Nausea/vomiting/dehyrdation- Patient reports nausea and vomiting since she woke up this morning. She has vomited three times today. Phenergan and IV fluids given in the office today. 1L given see note. Will send Antivert.   2. Dizziness- Patient reports dizziness since she woke up this morning that is worse with laying down. She is complaining of ringing in her right ear today. She does have a history of inner ear problems and I believe that BPPV is likely. Unable to perform Dix-Hallpike maneuver during exam due to patient's severe nausea. Discussed Epley's maneuver, handout given. Encouraged patient to try this at home. If she is not able to tolerate at home then can order home PT.   3. Bladder pain/Frequent urination- Patient complains of bladder pain and increased urination for the past 2-3 weeks. She was evaluated for this same problem last week by Dr. Madilyn Fireman. CT of abdomen negative. UA and urine culture negative at the time. UA today with small leukocytes, negative nitrates, negative blood, negative protein. Will send urine for culture.

## 2015-12-12 ENCOUNTER — Telehealth: Payer: Self-pay | Admitting: *Deleted

## 2015-12-12 NOTE — Telephone Encounter (Signed)
PT report: seen today. 5/10 dizziness with dix hallpike to the right.Dump maneuver done today.  He will Go back Monday and recheck without antivert and report then. Reported patient to be doing much better.

## 2015-12-12 NOTE — Telephone Encounter (Signed)
Erika Lynch PT from [facility] called and wanted to give a report to [facility].I told him I would be happy to relay the message to her as she is in clinic but he insisted Jade call him back. He would not give me any information. His # is [phone]).

## 2015-12-13 ENCOUNTER — Telehealth: Payer: Self-pay | Admitting: *Deleted

## 2015-12-13 LAB — URINE CULTURE
Colony Count: NO GROWTH
Organism ID, Bacteria: NO GROWTH

## 2015-12-13 NOTE — Telephone Encounter (Signed)
[facility] - Rx #[id number]   PA initiated

## 2015-12-15 MED ORDER — MOMETASONE FUROATE 220 MCG/INH IN AEPB: 2.0000 | INHALATION_SPRAY | Freq: Every day | RESPIRATORY_TRACT | Status: DC

## 2015-12-15 NOTE — Telephone Encounter (Signed)
New rx sent for Asmanex.

## 2015-12-20 ENCOUNTER — Other Ambulatory Visit: Payer: Self-pay | Admitting: Physician Assistant

## 2015-12-20 ENCOUNTER — Telehealth: Payer: Self-pay | Admitting: Physician Assistant

## 2015-12-20 NOTE — Telephone Encounter (Signed)
Yvone Neu from [facility] [phone]) called to state he went out and evaluated Pt for her vertigo. They preformed vestibular rehab on Pt Monday, Pt reports she no longer is having any vertigo symptoms. Pt also reports no longer taking her vertigo medications. Will route to treating Provider.

## 2015-12-24 NOTE — Telephone Encounter (Signed)
Closing. Spoke to [place] last week and the patient had already picked this up

## 2016-02-11 ENCOUNTER — Telehealth: Payer: Self-pay

## 2016-02-11 NOTE — Telephone Encounter (Signed)
[employer] called for authorization for Qvar. Qvar is not on current medication list.

## 2016-04-10 ENCOUNTER — Telehealth: Payer: Self-pay | Admitting: *Deleted

## 2016-04-10 NOTE — Telephone Encounter (Signed)
Form completed, faxed, copied, scanned and confirmation received.Erika Lynch [place]

## 2016-05-13 ENCOUNTER — Other Ambulatory Visit: Payer: Self-pay | Admitting: *Deleted

## 2016-05-13 MED ORDER — OMEPRAZOLE 40 MG PO CPDR: 40.0000 mg | DELAYED_RELEASE_CAPSULE | Freq: Every day | ORAL | 1 refills | Status: DC

## 2016-05-14 ENCOUNTER — Ambulatory Visit: Payer: Medicare Other | Admitting: Family Medicine

## 2016-05-21 ENCOUNTER — Encounter: Payer: Self-pay | Admitting: Family Medicine

## 2016-05-21 ENCOUNTER — Ambulatory Visit (INDEPENDENT_AMBULATORY_CARE_PROVIDER_SITE_OTHER): Payer: Medicare Other | Admitting: Family Medicine

## 2016-05-21 VITALS — BP 131/54 | HR 63 | Ht 63.0 in | Wt 129.0 lb

## 2016-05-21 DIAGNOSIS — N183 Chronic kidney disease, stage 3 unspecified: Secondary | ICD-10-CM

## 2016-05-21 DIAGNOSIS — J4550 Severe persistent asthma, uncomplicated: Secondary | ICD-10-CM | POA: Diagnosis not present

## 2016-05-21 DIAGNOSIS — E785 Hyperlipidemia, unspecified: Secondary | ICD-10-CM

## 2016-05-21 DIAGNOSIS — I10 Essential (primary) hypertension: Secondary | ICD-10-CM | POA: Diagnosis not present

## 2016-05-21 MED ORDER — FLUTICASONE FUROATE-VILANTEROL 100-25 MCG/INH IN AEPB: 1.0000 | INHALATION_SPRAY | Freq: Every day | RESPIRATORY_TRACT | 5 refills | Status: DC

## 2016-05-21 NOTE — Progress Notes (Signed)
Subjective:    CC:   HPI:  Hypertension- Pt denies chest pain, SOB, dizziness, or heart palpitations.  Taking meds as directed w/o problems.  Denies medication side effects.   Follow-up asthma- Says the asmanex causes her throat to swell. He tried using it for 3 days in a row and each time she got a swelling and burning sensation in her throat. She stopped it and has been using her albuterol twice a day instead. She already had her flu shot this fall.    CKD 3- No recent changes in urination. No swelling.  Hyperlipidemia-currently on pravastatin daily. Tolerating it well without any side effects or problems.  Past medical history, Surgical history, Family history not pertinant except as noted below, Social history, Allergies, and medications have been entered into the medical record, reviewed, and corrections made.   Review of Systems: No fevers, chills, night sweats, weight loss, chest pain, or shortness of breath.   Objective:    General: Well Developed, well nourished, and in no acute distress.  Neuro: Alert and oriented x3, extra-ocular muscles intact, sensation grossly intact.  HEENT: Normocephalic, atraumatic  Skin: Warm and dry, no rashes. Cardiac: Regular rate and rhythm, no murmurs rubs or gallops, no lower extremity edema.  Respiratory: Clear to auscultation bilaterally. Not using accessory muscles, speaking in full sentences.   Impression and Recommendations:   HTN - Well controlled. Continue current regimen. Follow-up in 6 months.  Due for CMP and lipids.    CKD - 3 - due to recheck renal function today.  Asthmatic bronchitis - will try to see if we can get Breo covered so that she can go back to just using her albuterol as needed. New prescription sent to the pharmacy.  Hyperlipidemia-due to recheck lipid panel.

## 2016-05-22 ENCOUNTER — Other Ambulatory Visit: Payer: Self-pay | Admitting: *Deleted

## 2016-05-22 LAB — LIPID PANEL
Cholesterol: 227 mg/dL — ABNORMAL HIGH (ref 125–200)
HDL: 38 mg/dL — AB (ref >=46)
LDL CALC: 144 mg/dL — AB (ref <130)
TRIGLYCERIDES: 223 mg/dL — AB (ref <150)
Total CHOL/HDL Ratio: 6 Ratio — ABNORMAL HIGH (ref <=5.0)
VLDL: 45 mg/dL — AB (ref <30)

## 2016-05-22 LAB — COMPLETE METABOLIC PANEL WITH GFR
ALT: 8 U/L (ref 6–29)
AST: 16 U/L (ref 10–35)
Albumin: 4.3 g/dL (ref 3.6–5.1)
Alkaline Phosphatase: 51 U/L (ref 33–130)
BILIRUBIN TOTAL: 0.4 mg/dL (ref 0.2–1.2)
BUN: 10 mg/dL (ref 7–25)
CO2: 27 mmol/L (ref 20–31)
CREATININE: 1.08 mg/dL — AB (ref 0.60–0.88)
Calcium: 9.9 mg/dL (ref 8.6–10.4)
Chloride: 103 mmol/L (ref 98–110)
GFR, EST AFRICAN AMERICAN: 56 mL/min — AB (ref >=60)
GFR, Est Non African American: 48 mL/min — ABNORMAL LOW (ref >=60)
GLUCOSE: 94 mg/dL (ref 65–99)
Potassium: 4.3 mmol/L (ref 3.5–5.3)
SODIUM: 141 mmol/L (ref 135–146)
TOTAL PROTEIN: 6.9 g/dL (ref 6.1–8.1)

## 2016-05-22 MED ORDER — OMEPRAZOLE 40 MG PO CPDR
40.0000 mg | DELAYED_RELEASE_CAPSULE | Freq: Every day | ORAL | 1 refills | Status: DC
Start: 2016-05-22 — End: 2016-08-05

## 2016-05-22 NOTE — Progress Notes (Signed)
rx was sent to local [facility] for 90 day supply needs to be sent to [facility].called and canceled at local [facility]

## 2016-06-05 ENCOUNTER — Ambulatory Visit (INDEPENDENT_AMBULATORY_CARE_PROVIDER_SITE_OTHER): Payer: Medicare Other | Admitting: Family Medicine

## 2016-06-05 ENCOUNTER — Encounter: Payer: Self-pay | Admitting: Family Medicine

## 2016-06-05 DIAGNOSIS — L600 Ingrowing nail: Secondary | ICD-10-CM

## 2016-06-05 DIAGNOSIS — L603 Nail dystrophy: Secondary | ICD-10-CM

## 2016-06-05 NOTE — Progress Notes (Signed)
Toenail Avulsion Procedure Note  Pre-operative Diagnosis: Right Ingrown Great toenail   Post-operative Diagnosis: Right Ingrown Great toenail  Indications: ingrown, infection and pain.   Anesthesia: Lidocaine 1% without epinephrine    Procedure Details   The risks (including bleeding and infection) and benefits of the  procedure and Verbal informed consent obtained.  After digital block anesthesia was obtained, a tourniquet was applied for hemostasis during the procedure.  After prepping with Betadine, the offending edge of the nail was freed from the nailbed and perionychium, and then split with scissors and removed with  forceps.  Entire nail removed.  All visible granulation tissue is debrided. Antibiotic and bulky dressing was applied.   Findings: Ingrown nail. Dystrophic nail.   Complications: none.  Plan: 1. Soak the foot twice daily. Change dressing twice daily until healed over. 2. Warning signs of infection were reviewed.   3. Recommended that the patient use NSAID and OTC acetaminophen as needed for pain.  4. Return PRN.

## 2016-06-05 NOTE — Patient Instructions (Signed)
Keep bandage on for about 24 hours. If he started to see blood through the edge of the bandage than you can definitely change it. Try to keep foot elevated this evening. Ice if needed if it feels like it's throbbing. Can use ibuprofen/Aleve and Tylenol if needed for pain relief. After 24 hours she can remove the bandage and get it wet in the shower. Just patch dry and apply Vaseline. Keep uncovered is able to stay home. Okay to do Epsom salt soaks on Sunday if needed.

## 2016-08-04 ENCOUNTER — Other Ambulatory Visit: Payer: Self-pay | Admitting: Family Medicine

## 2016-08-13 ENCOUNTER — Encounter: Payer: Self-pay | Admitting: Family Medicine

## 2016-08-13 ENCOUNTER — Ambulatory Visit (INDEPENDENT_AMBULATORY_CARE_PROVIDER_SITE_OTHER): Payer: Medicare Other | Admitting: Family Medicine

## 2016-08-13 VITALS — BP 138/53 | HR 68 | Ht 63.0 in | Wt 132.0 lb

## 2016-08-13 DIAGNOSIS — J4520 Mild intermittent asthma, uncomplicated: Secondary | ICD-10-CM | POA: Diagnosis not present

## 2016-08-13 DIAGNOSIS — M7989 Other specified soft tissue disorders: Secondary | ICD-10-CM | POA: Diagnosis not present

## 2016-08-13 DIAGNOSIS — I10 Essential (primary) hypertension: Secondary | ICD-10-CM

## 2016-08-13 MED ORDER — ALBUTEROL SULFATE HFA 108 (90 BASE) MCG/ACT IN AERS: 2.0000 | INHALATION_SPRAY | Freq: Four times a day (QID) | RESPIRATORY_TRACT | 0 refills | Status: DC | PRN

## 2016-08-13 MED ORDER — AMLODIPINE BESYLATE 5 MG PO TABS: 5.0000 mg | ORAL_TABLET | Freq: Every day | ORAL | 1 refills | Status: DC

## 2016-08-13 NOTE — Progress Notes (Signed)
Subjective:    CC:   HPI:  Asthma - pt reports that the asmanex is not working. she did not get the Breo filled and would like to go back on the albuterol or an alternative that will work for her and her insurance will cover.He says she did feel short of breath a couple of days ago when she went outside in the cold to get her mail.  Hypertension- Pt denies chest pain, SOB, dizziness, or heart palpitations.  Taking meds as directed w/o problems.  Denies medication side effects.    Swelling in her right lower leg with some pain. She says it's been going on since at least Christmas a probably 2-3 weeks. No trauma or injury. She has had an old sprain in that ankle before. She says at times she'll get a shooting pain near the top of her leg and then has noticed the swelling. She scissors 2 tender spots one on the medial side of the lower leg and one on the lateral side. No erythema or rash.  Past medical history, Surgical history, Family history not pertinant except as noted below, Social history, Allergies, and medications have been entered into the medical record, reviewed, and corrections made.   Review of Systems: No fevers, chills, night sweats, weight loss, chest pain, or shortness of breath.   Objective:    General: Well Developed, well nourished, and in no acute distress.  Neuro: Alert and oriented x3, extra-ocular muscles intact, sensation grossly intact.  HEENT: Normocephalic, atraumatic  Skin: Warm and dry, no rashes. Cardiac: Regular rate and rhythm, no murmurs rubs or gallops, no lower extremity edema.  Respiratory: Clear to auscultation bilaterally. Not using accessory muscles, speaking in full sentences. Ext: Some trace edema in left lower leg around the ankle. She does have 2 tender areas that they don't look bruised erythematous. No rash. Dorsal pedal pulse is 2+ in the left foot. Posterior tibialis 1+. Nontender with squeeze of the calf muscle.   Impression and  Recommendations:   Asthma - Will get her prescription for Ventolin. She does not currently have an albuterol prescription. I want her to keep track of how often she is actually using it. If using it frequently then we will need to get her back on a controller.  HTN - Well controlled. Continue current regimen. Follow up in  6 mo.   Left lower leg swelling 2-3 weeks-unclear etiology. No tenderness with calf squeeze or dorsiflexion of the foot. So will just get a d-dimer just to rule out a DVT. If positive and will get a ultrasound for further evaluation. Continue wearing compression stockings.

## 2016-08-14 ENCOUNTER — Ambulatory Visit (HOSPITAL_BASED_OUTPATIENT_CLINIC_OR_DEPARTMENT_OTHER)
Admission: RE | Admit: 2016-08-14 | Discharge: 2016-08-14 | Disposition: A | Discharge status: Home / Self Care | Payer: Medicare Other | Source: Ambulatory Visit | Attending: Family Medicine | Admitting: Family Medicine

## 2016-08-14 DIAGNOSIS — M7989 Other specified soft tissue disorders: Secondary | ICD-10-CM | POA: Diagnosis present

## 2016-08-14 LAB — CBC WITH DIFFERENTIAL/PLATELET
BASOS ABS: 114 {cells}/uL (ref 0–200)
Basophils Relative: 1 %
EOS ABS: 684 {cells}/uL — AB (ref 15–500)
EOS PCT: 6 %
HCT: 43.8 % (ref 35.0–45.0)
Hemoglobin: 14.7 g/dL (ref 11.7–15.5)
LYMPHS PCT: 48 %
Lymphs Abs: 5472 cells/uL — ABNORMAL HIGH (ref 850–3900)
MCH: 32.2 pg (ref 27.0–33.0)
MCHC: 33.6 g/dL (ref 32.0–36.0)
MCV: 95.8 fL (ref 80.0–100.0)
MONOS PCT: 6 %
MPV: 10.7 fL (ref 7.5–12.5)
Monocytes Absolute: 684 cells/uL (ref 200–950)
Neutro Abs: 4446 cells/uL (ref 1500–7800)
Neutrophils Relative %: 39 %
PLATELETS: 270 10*3/uL (ref 140–400)
RBC: 4.57 MIL/uL (ref 3.80–5.10)
RDW: 13.7 % (ref 11.0–15.0)
WBC: 11.4 10*3/uL — ABNORMAL HIGH (ref 3.8–10.8)

## 2016-08-14 LAB — TSH: TSH: 3.19 m[IU]/L

## 2016-08-14 LAB — D-DIMER, QUANTITATIVE (NOT AT ARMC): D DIMER QUANT: 1.04 ug{FEU}/mL — AB (ref <0.50)

## 2016-08-14 NOTE — Addendum Note (Signed)
Addended by: Teddy Spike on: 08/14/2016 09:23 AM   Modules accepted: Orders

## 2016-09-11 ENCOUNTER — Ambulatory Visit (INDEPENDENT_AMBULATORY_CARE_PROVIDER_SITE_OTHER): Payer: Medicare Other | Admitting: Family Medicine

## 2016-09-11 VITALS — BP 173/80 | HR 62 | Ht 63.0 in | Wt 131.0 lb

## 2016-09-11 DIAGNOSIS — J449 Chronic obstructive pulmonary disease, unspecified: Secondary | ICD-10-CM

## 2016-09-11 DIAGNOSIS — R0602 Shortness of breath: Secondary | ICD-10-CM | POA: Diagnosis not present

## 2016-09-11 DIAGNOSIS — I10 Essential (primary) hypertension: Secondary | ICD-10-CM | POA: Diagnosis not present

## 2016-09-11 MED ORDER — TIOTROPIUM BROMIDE MONOHYDRATE 18 MCG IN CAPS: 18.0000 ug | ORAL_CAPSULE | RESPIRATORY_TRACT | 3 refills | Status: DC

## 2016-09-11 MED ORDER — AMLODIPINE BESYLATE 5 MG PO TABS: 5.0000 mg | ORAL_TABLET | Freq: Every day | ORAL | 0 refills | Status: DC

## 2016-09-11 MED ORDER — ALBUTEROL SULFATE (2.5 MG/3ML) 0.083% IN NEBU
2.5000 mg | INHALATION_SOLUTION | Freq: Once | RESPIRATORY_TRACT | Status: AC
  Administered 2016-09-11: 2.5 mg via RESPIRATORY_TRACT

## 2016-09-11 NOTE — Progress Notes (Signed)
   Subjective:    Patient ID: Erika Lynch, female    DOB: 1933-01-19, 83 y.o.   MRN: [id number]  HPI 83 year old female comes in today for spirometry.  She was previously diagnosed with asthma and bronchitis. Asked time I saw her about 4 weeks ago she been using Asmanex and felt like it really wasn't working well. I have given her prescription for Breo previously and she had never had it filled. She noticed that cold was causing some shortness of breath trigger for her. Sinuses getting short of breath with certain activities. I could not find a recent spirometry on file also had encouraged her to come in today. She is here today with her daughter. Next  Blood pressure-she says that she did not get a perception for the amlodipine so she has been off of it for several weeks.   Review of Systems     Objective:   Physical Exam  Constitutional: She is oriented to person, place, and time. She appears well-developed and well-nourished.  HENT:  Head: Normocephalic and atraumatic.  Cardiovascular: Normal rate, regular rhythm and normal heart sounds.   Pulmonary/Chest: Effort normal and breath sounds normal.  Neurological: She is alert and oriented to person, place, and time.  Skin: Skin is warm and dry.  Psychiatric: She has a normal mood and affect. Her behavior is normal.          Assessment & Plan:  COPD with asthma-Spirometry- Results consistent with stage II COPD with an asthmatic component.  FVC of 94%, FEV1 56% with a ratio 43%. No significant improvement though she did improve FEV1 by 6% post albuterol. And her FEF 25-75 improved by 25% after albuterol. She just filled the [facility] prescription I will have her take that for a month and then try switching her to an anticholinergic.  Hypertension-her daughter actually called the mail order while she was in the office and verified that the patient had actually declined the prescription. They will re-activated and send it back out to her  and she will hopefully get in 1-2 weeks. We did send over a temporary supply to the local pharmacies that she could get back on it.

## 2016-12-04 ENCOUNTER — Other Ambulatory Visit: Payer: Self-pay | Admitting: *Deleted

## 2016-12-04 MED ORDER — METOPROLOL TARTRATE 50 MG PO TABS: 50.0000 mg | ORAL_TABLET | Freq: Two times a day (BID) | ORAL | 2 refills | Status: DC

## 2016-12-10 IMAGING — CT CT ABD-PELV W/ CM
2 of 5 series · 15 of 46 positions shown, 17 images · IV contrast (APPLIED)
Comparison: Unenhanced CT of the abdomen and pelvis on 01/02/2010

CLINICAL DATA: Low back pain radiating to suprapubic region.
History of right-sided renal calculi.

EXAM:
CT ABDOMEN AND PELVIS WITH CONTRAST
TECHNIQUE: Multidetector CT imaging of the abdomen and pelvis was performed
using the standard protocol following bolus administration of
intravenous contrast.
CONTRAST:  100mL [id number] IOPAMIDOL ([id number]) INJECTION 61%

[Series 2: axial st · axial · 0.72mm/px · z∈[+256,+676]mm · 12 of 94 slices shown, 14 images]
[im 5/94  soft-tissue]
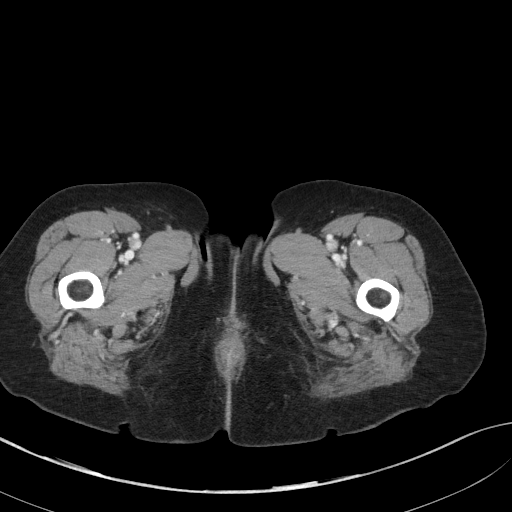
[im 5/94  bone]
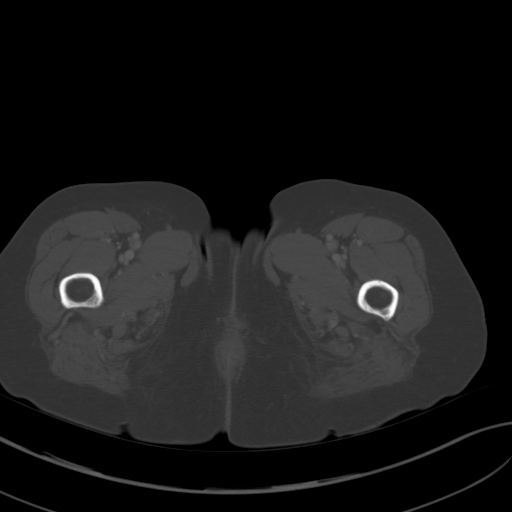
[im 15/94  soft-tissue]
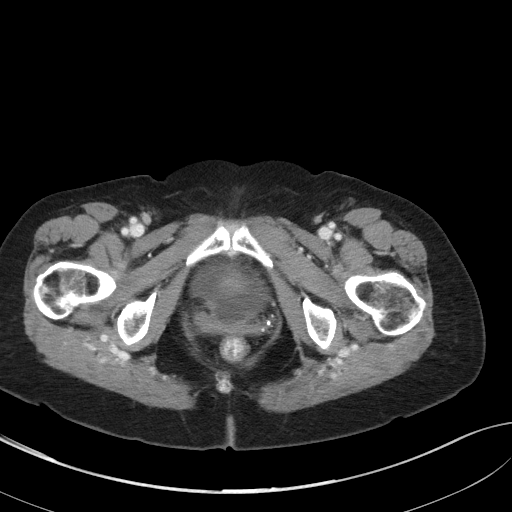
[im 20/94  soft-tissue]
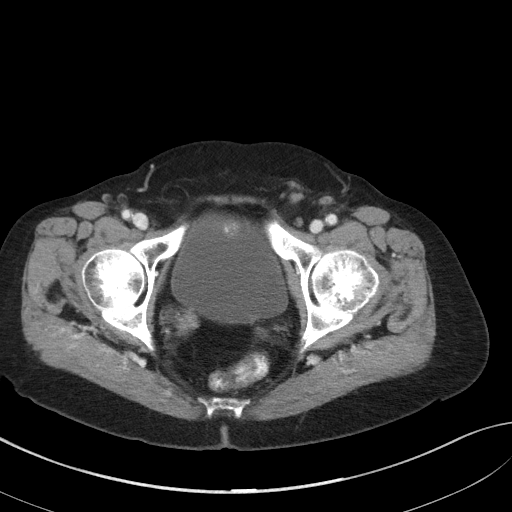
[im 30/94  soft-tissue]
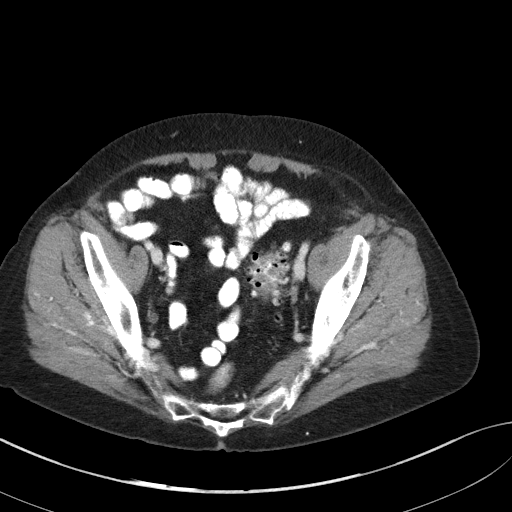
[im 35/94  soft-tissue]
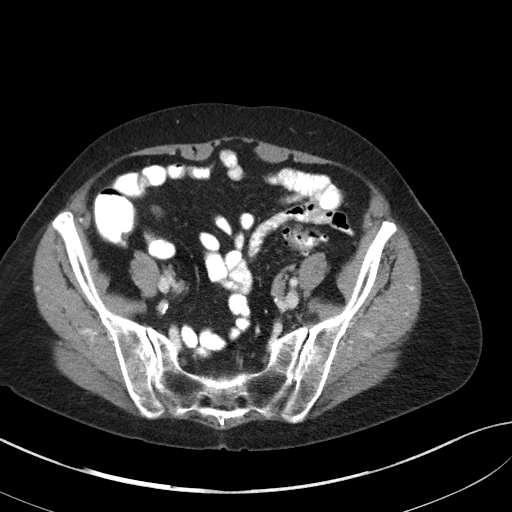
[im 45/94  soft-tissue]
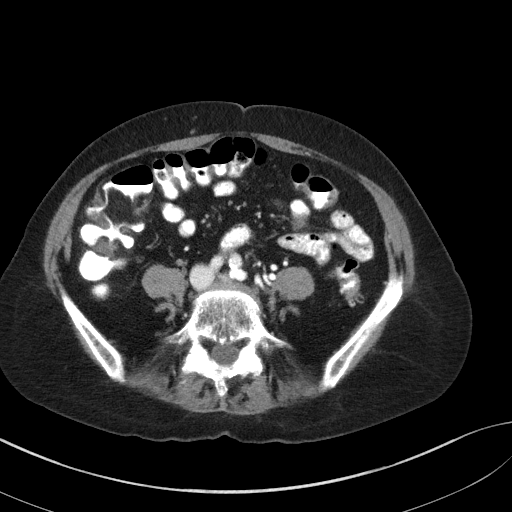
[im 49/94  soft-tissue]
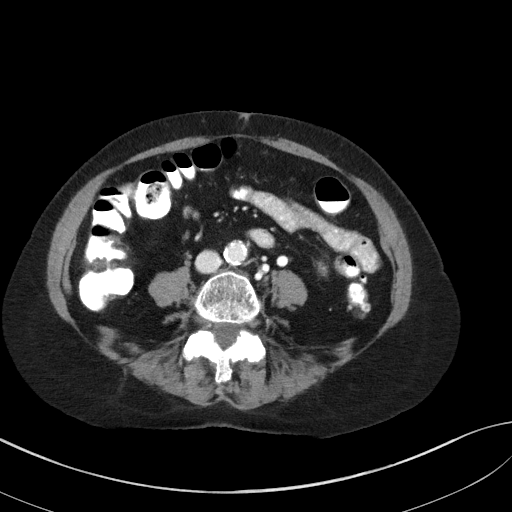
[im 59/94  soft-tissue]
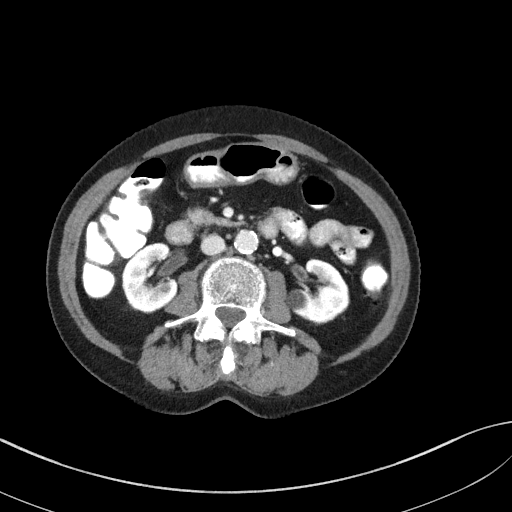
[im 64/94  soft-tissue]
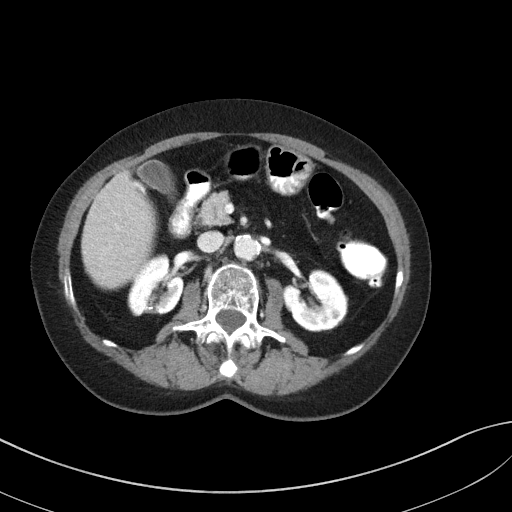
[im 64/94  bone]
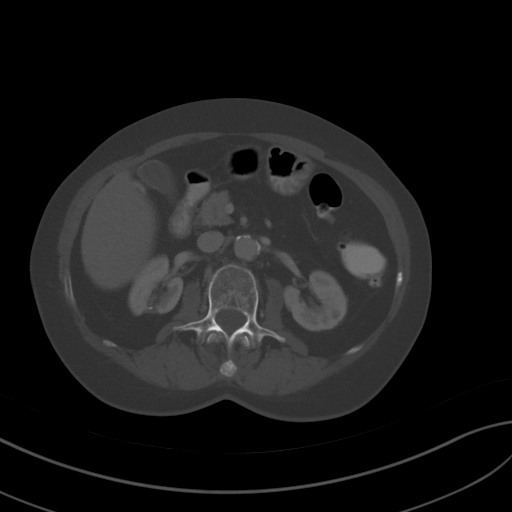
[im 74/94  soft-tissue]
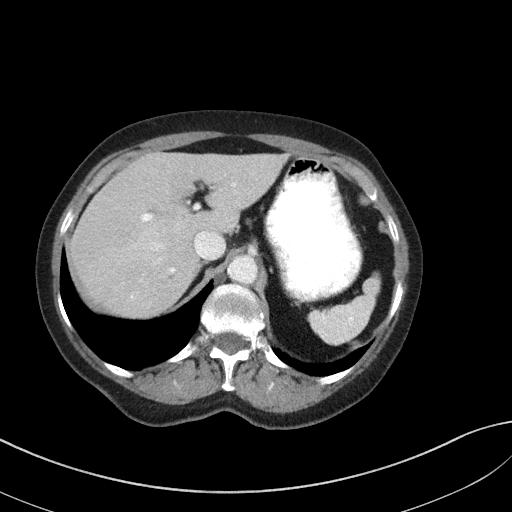
[im 79/94  soft-tissue]
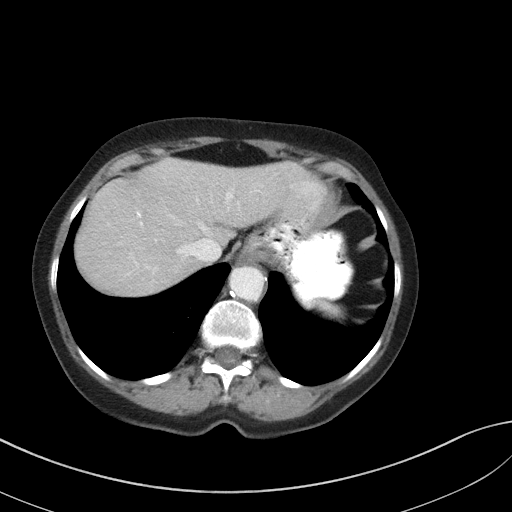
[im 89/94  soft-tissue]
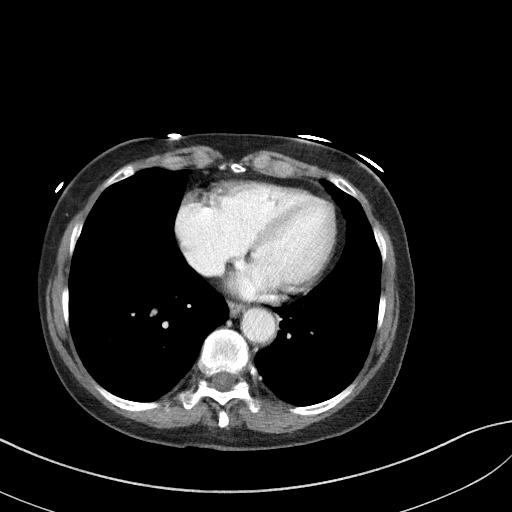

[Series 5: coronal st · coronal · 0.67mm/px · 3 of 87 slices shown]
[im 29/87  soft-tissue]
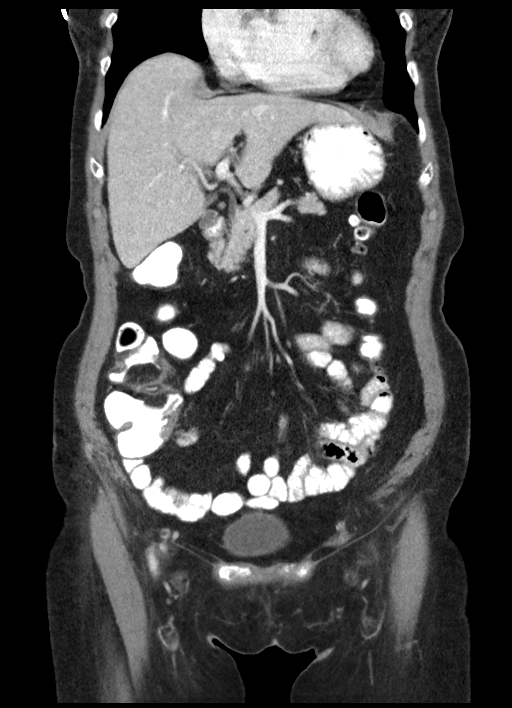
[im 39/87  soft-tissue]
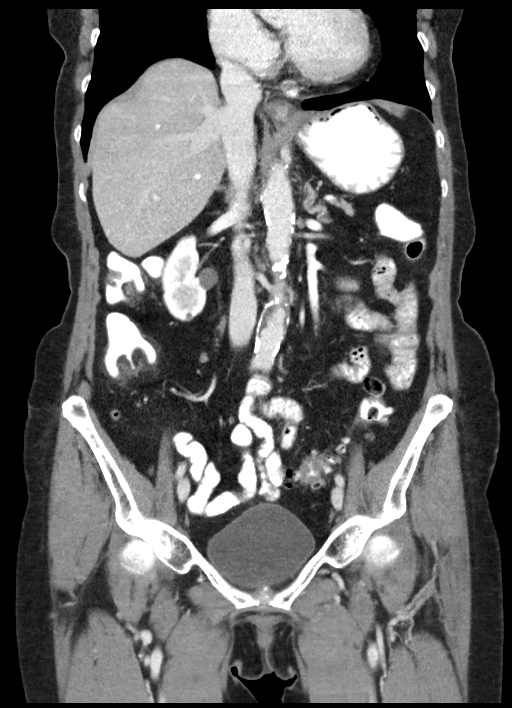
[im 48/87  soft-tissue]
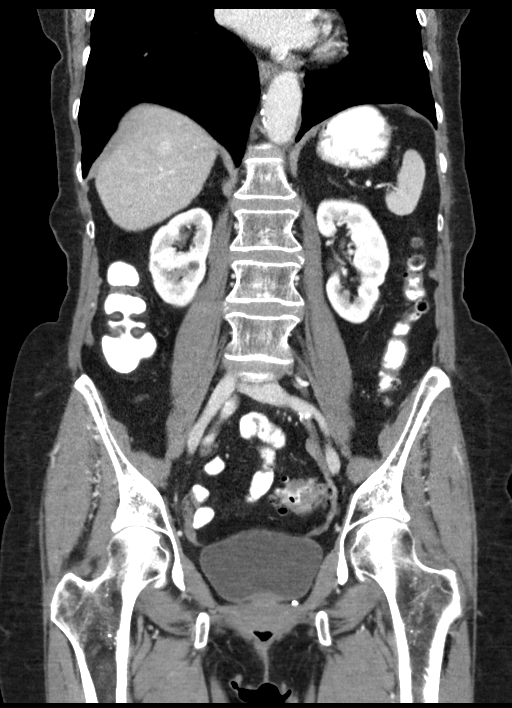

[15 of 46 positions shown; findings below may reference images not displayed]

FINDINGS: Lower chest:  No acute findings.

Hepatobiliary: The liver and gallbladder are unremarkable. No
biliary ductal dilatation identified.

Pancreas: No mass, inflammatory changes, or other significant
abnormality.

Spleen: Within normal limits in size and appearance.

Adrenals/Urinary Tract: No hydronephrosis. Stable cortical scarring
thinning involving the posterior upper pole cortex of the right
kidney with some underlying areas calcification representing either
nonobstructing calculi or dystrophic calcification. The small areas
of calcification are unchanged since 1177. Benign left renal cyst
slightly larger compared to the prior study measuring 1.5 cm
(previously 1.2 cm). The bladder is unremarkable.

Stomach/Bowel: No evidence of bowel obstruction, inflammatory
process, or abnormal fluid collections. No free intraperitoneal air.
Diffuse diverticulosis present of the descending and sigmoid colon
without evidence of diverticulitis.

Vascular/Lymphatic: No lymphadenopathy identified. Plaque is present
scattered throughout the abdominal aorta without evidence of
aneurysm.

Reproductive: The uterus has been removed.

Other: No hernias identified.

Musculoskeletal: Degenerative disc disease at L5-S1 with disc space
narrowing and proliferative changes present. Posterior disc bulge at
the L3-4 level.
IMPRESSION: 1. No acute findings in the abdomen or pelvis.
2. Stable cortical scarring with adjacent nonobstructing calculi
versus dystrophic calcifications in the right kidney.
3. Diverticulosis of the colon without evidence of diverticulitis.
4. Degenerative disc disease L3-4 and L5-S1.

## 2016-12-17 ENCOUNTER — Ambulatory Visit (INDEPENDENT_AMBULATORY_CARE_PROVIDER_SITE_OTHER): Payer: Medicare Other | Admitting: Family Medicine

## 2016-12-17 ENCOUNTER — Encounter: Payer: Self-pay | Admitting: Family Medicine

## 2016-12-17 VITALS — BP 140/82 | HR 67 | Ht 63.0 in | Wt 135.0 lb

## 2016-12-17 DIAGNOSIS — I10 Essential (primary) hypertension: Secondary | ICD-10-CM | POA: Diagnosis not present

## 2016-12-17 DIAGNOSIS — G629 Polyneuropathy, unspecified: Secondary | ICD-10-CM | POA: Diagnosis not present

## 2016-12-17 DIAGNOSIS — R0789 Other chest pain: Secondary | ICD-10-CM

## 2016-12-17 DIAGNOSIS — J449 Chronic obstructive pulmonary disease, unspecified: Secondary | ICD-10-CM

## 2016-12-17 DIAGNOSIS — K224 Dyskinesia of esophagus: Secondary | ICD-10-CM

## 2016-12-17 DIAGNOSIS — J4520 Mild intermittent asthma, uncomplicated: Secondary | ICD-10-CM

## 2016-12-17 DIAGNOSIS — Z8719 Personal history of other diseases of the digestive system: Secondary | ICD-10-CM | POA: Diagnosis not present

## 2016-12-17 MED ORDER — ACETAMINOPHEN 500 MG PO TABS: 500.0000 mg | ORAL_TABLET | Freq: Three times a day (TID) | ORAL | 4 refills | Status: DC | PRN

## 2016-12-17 MED ORDER — AMLODIPINE BESYLATE 5 MG PO TABS: 5.0000 mg | ORAL_TABLET | Freq: Every day | ORAL | 1 refills | Status: DC

## 2016-12-17 MED ORDER — OMEPRAZOLE 40 MG PO CPDR: 40.0000 mg | DELAYED_RELEASE_CAPSULE | Freq: Every day | ORAL | 1 refills | Status: DC

## 2016-12-17 MED ORDER — FLUTICASONE PROPIONATE 50 MCG/ACT NA SUSP: 1.0000 | Freq: Every day | NASAL | 3 refills | Status: DC

## 2016-12-17 MED ORDER — ALBUTEROL SULFATE HFA 108 (90 BASE) MCG/ACT IN AERS: 2.0000 | INHALATION_SPRAY | Freq: Four times a day (QID) | RESPIRATORY_TRACT | 4 refills | Status: DC | PRN

## 2016-12-17 NOTE — Progress Notes (Signed)
Subjective:    CC: HTN, Asthma  HPI:  Hypertension- Pt denies chest pain, SOB, dizziness, or heart palpitations.  Taking meds as directed w/o problems.  Denies medication side effects.    COPD with Asthma - He did keep a log of how often she is using her albuterol. She used it on 10 days and February. Then used it 3 days in March, 3 days in April, 2 days in May. She complains that the Spiriva is causing a cough. She says the cough is triggered for several hours after she does the Spiriva and occasionally notices that her voice is hoarse. She has tried drinking some fluid and rinsing her mouth afterwards.  She also complains of pain that's been going on for several months from her knees down. It's worse on her left foot and leg compared to her right. She says sometimes it can feel like shards of glass on the bottoms of her feet. It's also extremely tender to touch when she touches her shin. He also reports that her left foot occasionally swells. She says even a couple days ago was very swollen but actually looks much better today.  Reports she had some midsternal chest pain about 2 days ago. She denies any heartburn or belching. She doesn't think it's her heart as she had a cardiac workup not that long ago. She really thinks it's her esophagus as she's had problems on and off with that for years where she's had some Richard peristalsis as well as some previous strictures.  Past medical history, Surgical history, Family history not pertinant except as noted below, Social history, Allergies, and medications have been entered into the medical record, reviewed, and corrections made.   Review of Systems: No fevers, chills, night sweats, weight loss, chest pain, or shortness of breath.   Objective:    General: Well Developed, well nourished, and in no acute distress.  Neuro: Alert and oriented x3, extra-ocular muscles intact, sensation grossly intact.  HEENT: Normocephalic, atraumatic  Skin: Warm and  dry, no rashes. Cardiac: Regular rate and rhythm, no murmurs rubs or gallops, She has some trace edema in the left ankle and foot. Dorsal pedal pulses are 2+ bilaterally. No skin rash or discoloration of the feet. Respiratory: Clear to auscultation bilaterally. Not using accessory muscles, speaking in full sentences.    Impression and Recommendations:   HTN - Blood pressure just borderline today. We will keep an eye on this.  COPD with asthma-stable. We'll continue Spiriva for now since she just filled a 90 day supply. After that we can try changing the regimen to tudorza or Incruse to see if she does better with that and doesn't cause a cough.    Neuropathy-suspect peripheral neuropathy. She had a fairly normal monofilament test today. No prior history of diabetes.  Likely idiopathic but will schedule her for nerve conduction study.  Atypical chest pain-suspect esophagitis based on her history. Offered to refer her back to GI for she's also been experiencing some occasional sensation of feeling like food getting stuck. That she says it happens rarely. She has had a prior history of esophageal strictures. She says she will let me know if it becomes more bothersome. Continue Prilosec OTC.    Dyskinesia of the esophagus-see note above.

## 2016-12-17 NOTE — Patient Instructions (Signed)
Nerve conduction study of her lower legs.

## 2017-02-11 ENCOUNTER — Encounter: Payer: Self-pay | Admitting: Family Medicine

## 2017-02-11 ENCOUNTER — Ambulatory Visit (INDEPENDENT_AMBULATORY_CARE_PROVIDER_SITE_OTHER): Payer: Medicare Other | Admitting: Family Medicine

## 2017-02-11 VITALS — BP 142/74 | HR 67 | Ht 63.0 in | Wt 136.0 lb

## 2017-02-11 DIAGNOSIS — Z0000 Encounter for general adult medical examination without abnormal findings: Secondary | ICD-10-CM | POA: Diagnosis not present

## 2017-02-11 DIAGNOSIS — Z6824 Body mass index (BMI) 24.0-24.9, adult: Secondary | ICD-10-CM | POA: Diagnosis not present

## 2017-02-11 DIAGNOSIS — Z780 Asymptomatic menopausal state: Secondary | ICD-10-CM | POA: Diagnosis not present

## 2017-02-11 DIAGNOSIS — N183 Chronic kidney disease, stage 3 unspecified: Secondary | ICD-10-CM

## 2017-02-11 DIAGNOSIS — M26622 Arthralgia of left temporomandibular joint: Secondary | ICD-10-CM
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
## 2017-02-12 LAB — BASIC METABOLIC PANEL WITH GFR
BUN: 15 mg/dL (ref 7–25)
CHLORIDE: 102 mmol/L (ref 98–110)
CO2: 22 mmol/L (ref 20–31)
CREATININE: 1.43 mg/dL — AB (ref 0.60–0.88)
Calcium: 9.8 mg/dL (ref 8.6–10.4)
GFR, Est African American: 39 mL/min — ABNORMAL LOW (ref >=60)
GFR, Est Non African American: 34 mL/min — ABNORMAL LOW (ref >=60)
GLUCOSE: 95 mg/dL (ref 65–99)
POTASSIUM: 4.2 mmol/L (ref 3.5–5.3)
Sodium: 136 mmol/L (ref 135–146)

## 2017-02-15 NOTE — Addendum Note (Signed)
Addended by: Doree Albee on: 02/15/2017 01:26 PM   Modules accepted: Orders

## 2017-02-16 LAB — BASIC METABOLIC PANEL WITH GFR
BUN: 15 mg/dL (ref 7–25)
CHLORIDE: 103 mmol/L (ref 98–110)
CO2: 23 mmol/L (ref 20–31)
CREATININE: 1.44 mg/dL — AB (ref 0.60–0.88)
Calcium: 10 mg/dL (ref 8.6–10.4)
GFR, Est African American: 39 mL/min — ABNORMAL LOW (ref >=60)
GFR, Est Non African American: 34 mL/min — ABNORMAL LOW (ref >=60)
GLUCOSE: 92 mg/dL (ref 65–99)
Potassium: 4.7 mmol/L (ref 3.5–5.3)
SODIUM: 138 mmol/L (ref 135–146)

## 2017-02-16 NOTE — Addendum Note (Signed)
Addended by: Bo Mcclintock C on: 02/16/2017 08:30 AM   Modules accepted: Orders

## 2017-03-05 ENCOUNTER — Other Ambulatory Visit: Payer: Self-pay | Admitting: *Deleted

## 2017-03-05 MED ORDER — METOPROLOL TARTRATE 50 MG PO TABS: 50.0000 mg | ORAL_TABLET | Freq: Two times a day (BID) | ORAL | 2 refills | Status: DC

## 2017-03-05 NOTE — Progress Notes (Signed)
Mail order has not come in yet. 30 day supply sent to local pharm

## 2017-03-25 ENCOUNTER — Ambulatory Visit (INDEPENDENT_AMBULATORY_CARE_PROVIDER_SITE_OTHER): Payer: Medicare Other | Admitting: Family Medicine

## 2017-03-25 VITALS — BP 139/64 | HR 70 | Ht 63.0 in | Wt 129.7 lb

## 2017-03-25 DIAGNOSIS — R351 Nocturia: Secondary | ICD-10-CM | POA: Diagnosis not present

## 2017-03-25 DIAGNOSIS — J441 Chronic obstructive pulmonary disease with (acute) exacerbation: Secondary | ICD-10-CM

## 2017-03-25 DIAGNOSIS — J449 Chronic obstructive pulmonary disease, unspecified: Secondary | ICD-10-CM | POA: Diagnosis not present

## 2017-03-25 DIAGNOSIS — Z23 Encounter for immunization: Secondary | ICD-10-CM

## 2017-03-25 DIAGNOSIS — I10 Essential (primary) hypertension: Secondary | ICD-10-CM

## 2017-03-25 MED ORDER — PREDNISONE 20 MG PO TABS: 40.0000 mg | ORAL_TABLET | Freq: Every day | ORAL | 0 refills | Status: DC

## 2017-03-25 MED ORDER — DOXYCYCLINE HYCLATE 100 MG PO TABS: 100.0000 mg | ORAL_TABLET | Freq: Two times a day (BID) | ORAL | 0 refills | Status: DC

## 2017-03-25 MED ORDER — TIOTROPIUM BROMIDE-OLODATEROL 2.5-2.5 MCG/ACT IN AERS: 2.0000 | INHALATION_SPRAY | Freq: Every day | RESPIRATORY_TRACT | 3 refills | Status: DC

## 2017-03-25 MED ORDER — METOPROLOL TARTRATE 50 MG PO TABS: 50.0000 mg | ORAL_TABLET | Freq: Two times a day (BID) | ORAL | 3 refills | Status: DC

## 2017-03-25 MED ORDER — TIOTROPIUM BROMIDE-OLODATEROL 2.5-2.5 MCG/ACT IN AERS: 2.0000 | INHALATION_SPRAY | Freq: Every day | RESPIRATORY_TRACT | 5 refills | Status: DC

## 2017-03-25 MED ORDER — ALBUTEROL SULFATE (2.5 MG/3ML) 0.083% IN NEBU
2.5000 mg | INHALATION_SOLUTION | Freq: Once | RESPIRATORY_TRACT | Status: AC
  Administered 2017-03-25: 2.5 mg via RESPIRATORY_TRACT

## 2017-03-25 NOTE — Progress Notes (Signed)
   Subjective:    Patient ID: Erika Lynch, female    DOB: April 09, 1933, 83 y.o.   MRN: [id number]  HPI 83 year old female comes in today complaining of cough and chest congestion for about 12 days. She said she's been coughing up lots of white stuff. No other color to it. No fevers chills or swt she has felt like she's been tight across her chest. Currently on spiriva daily. Brought in her meds today.  Normall she doesn't have a productive cough.   She's also noticed some urinary symptoms. She's been getting up at night about 4 times a night for about the last 2 nights. She denies any dysuria or hematuria. No fevers chills or sweats.  Hypertension-she like to have her metoprolol changed to a 90 day supply and sent to her mail order.   Review of Systems     Objective:   Physical Exam  Constitutional: She is oriented to person, place, and time. She appears well-developed and well-nourished.  HENT:  Head: Normocephalic and atraumatic.  Right Ear: External ear normal.  Left Ear: External ear normal.  Nose: Nose normal.  Mouth/Throat: Oropharynx is clear and moist.  TMs and canals are clear.   Eyes: Pupils are equal, round, and reactive to light. Conjunctivae and EOM are normal.  Neck: Neck supple. No thyromegaly present.  Cardiovascular: Normal rate, regular rhythm and normal heart sounds.   Pulmonary/Chest: Effort normal and breath sounds normal. She has no wheezes.  Lymphadenopathy:    She has no cervical adenopathy.  Neurological: She is alert and oriented to person, place, and time.  Skin: Skin is warm and dry.  Psychiatric: She has a normal mood and affect.        Assessment & Plan:  COPD  -  Spirometry shows FVC of 75%, FEV1 of 51%, ratio of 50 with no significant response in FEV1 after albuterol. Though she did have a 15% increase in FEF 25-75 after albuterol. Most consistent with Moderate obstruction. This is a decline from previous testing done about 6 months ago though  I do feel like she's having a COPD flare today which could certainly be affecting her test results. Will change Spiriva to Stiolto. Note, she felt better after her albuterol tx.   COPD exacerbation with increased cough sputum production and shortness of breath. Will treat with prednisone and doxy.   Nocturia-recommend UA. Call with results once available.  Hypertension-new prescription sent for 90 day supply for metoprolol to mail order. He has enough at home until her mail order comes in.

## 2017-03-25 NOTE — Patient Instructions (Signed)
Ok will change your spiriva to [employer] ( it is a combo that has the Spiriva in it).

## 2017-03-26 ENCOUNTER — Telehealth: Payer: Self-pay

## 2017-03-26 ENCOUNTER — Telehealth: Payer: Self-pay | Admitting: Family Medicine

## 2017-03-26 DIAGNOSIS — R350 Frequency of micturition: Secondary | ICD-10-CM

## 2017-03-26 NOTE — Telephone Encounter (Signed)
Called patient, she will try to come in.

## 2017-03-26 NOTE — Telephone Encounter (Signed)
Please call patient and see if she can come in and do a urine test today. Please let her know that I apologize that forgot to collect urine specimen shape. She complained about having to get up and urinate more frequently at night. Send for UA and urine culture.

## 2017-03-26 NOTE — Telephone Encounter (Signed)
Pt gave urine specimen, to be sent downstairs.

## 2017-03-30 ENCOUNTER — Other Ambulatory Visit: Payer: Self-pay

## 2017-03-30 DIAGNOSIS — R350 Frequency of micturition: Secondary | ICD-10-CM

## 2017-03-30 LAB — URINALYSIS

## 2017-03-31 LAB — URINALYSIS, MICROSCOPIC ONLY
Bacteria, UA: NONE SEEN [HPF]
Casts: NONE SEEN [LPF]
Crystals: NONE SEEN [HPF]
RBC / HPF: NONE SEEN RBC/HPF (ref <=2)
YEAST: NONE SEEN [HPF]

## 2017-03-31 LAB — URINALYSIS, ROUTINE W REFLEX MICROSCOPIC
Bilirubin Urine: NEGATIVE
GLUCOSE, UA: NEGATIVE
HGB URINE DIPSTICK: NEGATIVE
KETONES UR: NEGATIVE
NITRITE: NEGATIVE
PROTEIN: NEGATIVE
Specific Gravity, Urine: 1.013 (ref 1.001–1.035)
pH: 6 (ref 5.0–8.0)

## 2017-04-01 ENCOUNTER — Other Ambulatory Visit: Payer: Self-pay | Admitting: Family Medicine

## 2017-04-03 LAB — URINE CULTURE

## 2017-04-06 ENCOUNTER — Ambulatory Visit (INDEPENDENT_AMBULATORY_CARE_PROVIDER_SITE_OTHER): Payer: Medicare Other | Admitting: Physician Assistant

## 2017-04-06 ENCOUNTER — Encounter: Payer: Self-pay | Admitting: Physician Assistant

## 2017-04-06 VITALS — BP 121/70 | HR 80 | Temp 98.1°F | Ht 63.0 in | Wt 130.0 lb

## 2017-04-06 DIAGNOSIS — Z8542 Personal history of malignant neoplasm of other parts of uterus: Secondary | ICD-10-CM

## 2017-04-06 DIAGNOSIS — R350 Frequency of micturition: Secondary | ICD-10-CM

## 2017-04-06 DIAGNOSIS — N183 Chronic kidney disease, stage 3 unspecified: Secondary | ICD-10-CM

## 2017-04-06 DIAGNOSIS — R3129 Other microscopic hematuria: Secondary | ICD-10-CM

## 2017-04-06 DIAGNOSIS — R198 Other specified symptoms and signs involving the digestive system and abdomen: Secondary | ICD-10-CM

## 2017-04-06 LAB — POCT URINALYSIS DIPSTICK
Bilirubin, UA: NEGATIVE
Glucose, UA: NEGATIVE
Ketones, UA: NEGATIVE
NITRITE UA: NEGATIVE
PROTEIN UA: NEGATIVE
Spec Grav, UA: <=1.005 — AB (ref 1.010–1.025)
UROBILINOGEN UA: 0.2 U/dL
pH, UA: 6 (ref 5.0–8.0)

## 2017-04-06 MED ORDER — CEFUROXIME AXETIL 250 MG PO TABS: 250.0000 mg | ORAL_TABLET | Freq: Two times a day (BID) | ORAL | 0 refills | Status: DC

## 2017-04-06 MED ORDER — LEVOFLOXACIN 250 MG PO TABS: 250.0000 mg | ORAL_TABLET | Freq: Every day | ORAL | 0 refills | Status: DC

## 2017-04-06 NOTE — Progress Notes (Signed)
HPI:                                                                Erika Lynch is a 83 y.o. female who presents to [facility] [place] today for dysuria  Patient reports urinary frequency for approximately 1 week. She was seen by her PCP for annual visit last week and urine culture grew 10,000 - 50,000 coag-negative Staph. She has also noted lower abdominal fullness x 5 days and reports pants feel tighter. History significant for uterine cancer in her 20's.   Urinary Frequency   This is a new problem. The current episode started in the past 7 days. The problem has been gradually worsening. There has been no fever. Associated symptoms include chills, flank pain, frequency and urgency. Pertinent negatives include no hematuria, nausea, possible pregnancy, sweats or vomiting. Associated symptoms comments: + lower abdominal/pelvic fullness. She has tried nothing for the symptoms.   Daughter is also requesting a referral to a new nephrologist, Dr. Welton Flakes in [facility]. [facility] refused to schedule an appointment for them stating they did not have patient's records. Referral note states ov notes were sent in July.  Past Medical History:  Diagnosis Date  . Dyskinesia of esophagus   . Hypertension   . Kidney stone   . Uterine cancer [facility])    Past Surgical History:  Procedure Laterality Date  . ABDOMINAL HYSTERECTOMY     Uterine cancer  . BACK SURGERY     Social History  Substance Use Topics  . Smoking status: Former Research scientist (life sciences)  . Smokeless tobacco: Never Used  . Alcohol use No   family history includes Breast cancer in her unknown relative; Colon cancer in her sister; Heart attack in her brother, brother, and father; Hyperlipidemia in her father; Hypertension in her father; Stroke in her mother.  ROS: negative except as noted in the HPI  Medications: Current Outpatient Prescriptions  Medication Sig Dispense Refill  .  acetaminophen (TYLENOL) 500 MG tablet Take 1 tablet (500 mg total) by mouth every 8 (eight) hours as needed. 180 tablet 4  . albuterol (PROVENTIL HFA;VENTOLIN HFA) 108 (90 Base) MCG/ACT inhaler Inhale 2 puffs into the lungs every 6 (six) hours as needed for wheezing or shortness of breath. 3 Inhaler 4  . amLODipine (NORVASC) 5 MG tablet Take 1 tablet (5 mg total) by mouth daily. 90 tablet 1  . fluticasone (FLONASE) 50 MCG/ACT nasal spray Place 1 spray into both nostrils daily. 48 g 3  . metoprolol tartrate (LOPRESSOR) 50 MG tablet Take 1 tablet (50 mg total) by mouth 2 (two) times daily. 180 tablet 3  . omeprazole (PRILOSEC) 40 MG capsule Take 1 capsule (40 mg total) by mouth daily. 90 capsule 1  . Tiotropium Bromide-Olodaterol (STIOLTO RESPIMAT) 2.5-2.5 MCG/ACT AERS Inhale 2 puffs into the lungs daily. 12 g 3  . vitamin B-12 (CYANOCOBALAMIN) 50 MCG tablet Take 50 mcg by mouth daily.     No current facility-administered medications for this visit.    Allergies  Allergen Reactions  . Aspirin   . Cisapride   . Imipramine Hcl   . Mometasone Swelling    Throat swelling   . Penicillins Rash  . Sulfonamide Derivatives Rash  Objective:  BP 121/70   Pulse 80   Temp 98.1 F (36.7 C) (Oral)   Ht 5\' 3"  (1.6 m)   Wt 130 lb (59 kg)   BMI 23.03 kg/m  Gen:  alert, not ill-appearing, no distress, appropriate for age HEENT: head normocephalic without obvious abnormality, conjunctiva and cornea clear, trachea midline Pulm: Normal work of breathing, normal phonation GI: abdomen soft, nontender, slightly distended, there is bilateral CVA tenderness MSK: extremities atraumatic, normal gait and station Skin: intact, no rashes on exposed skin, no jaundice, no cyanosis Psych: well-groomed, cooperative, good eye contact, euthymic mood, affect mood-congruent, speech is articulate, and thought processes clear and goal-directed    No results found for this or any previous visit (from the past  72 hour(s)). No results found.   Assessment and Plan: 83 y.o. female with   1. Urinary frequency - POCT Urinalysis Dipstick positive for small blood and small leuks. Treating empirically for acute cystitis with Levaquin 250mg  QD x 3 days (CrCl 27, no renal dose adjustment needed). Patient is afebrile, no tachycardia, not ill appearing, no nausea/vomiting. Low suspicion for pyelonephritis. Feel flank pain is likely musculoskeletal - reviewed prior culture, which showed low colony count of coag neg staph.  - Urine Culture pending - US PELVIS TRANSVANGINAL NON-OB (TV ONLY); Future - US PELVIS (TRANSABDOMINAL ONLY); Future - US Abdomen Complete; Future   2. Abdominal fullness - US PELVIS TRANSVANGINAL NON-OB (TV ONLY); Future - US PELVIS (TRANSABDOMINAL ONLY); Future - US Abdomen Complete; Future  3. History of uterine cancer   4. Microscopic hematuria - cefUROXime (CEFTIN) 250 MG tablet; Take 1 tablet (250 mg total) by mouth 2 (two) times daily with a meal.  Dispense: 14 tablet; Refill: 0  5. Stage 3 chronic kidney disease - GFR 34, Scr 1.43 02/15/2017 - Ambulatory referral to Nephrology  Patient education and anticipatory guidance given Patient agrees with treatment plan Follow-up with PCP in 1 week or sooner as needed  Darlyne Russian PA-C

## 2017-04-07 LAB — URINE CULTURE

## 2017-04-08 NOTE — Progress Notes (Signed)
Your urine culture was inconclusive and possibly contaminated with vaginal bacteria If you are still having symptoms, it is okay to finish the antibiotic If you are still symptomatic after the antibiotic, recommend you return for a follow-up with your PCP

## 2017-04-09 ENCOUNTER — Encounter: Payer: Self-pay | Admitting: Family Medicine

## 2017-04-09 ENCOUNTER — Ambulatory Visit (INDEPENDENT_AMBULATORY_CARE_PROVIDER_SITE_OTHER): Payer: Medicare Other | Admitting: Family Medicine

## 2017-04-09 ENCOUNTER — Telehealth: Payer: Self-pay

## 2017-04-09 VITALS — BP 119/57 | HR 68 | Temp 97.9°F | Wt 129.0 lb

## 2017-04-09 DIAGNOSIS — R140 Abdominal distension (gaseous): Secondary | ICD-10-CM

## 2017-04-09 DIAGNOSIS — R1030 Lower abdominal pain, unspecified: Secondary | ICD-10-CM | POA: Diagnosis not present

## 2017-04-09 DIAGNOSIS — R110 Nausea: Secondary | ICD-10-CM | POA: Diagnosis not present

## 2017-04-09 DIAGNOSIS — R319 Hematuria, unspecified: Secondary | ICD-10-CM | POA: Diagnosis not present

## 2017-04-09 DIAGNOSIS — N289 Disorder of kidney and ureter, unspecified: Secondary | ICD-10-CM

## 2017-04-09 LAB — POCT URINALYSIS DIPSTICK
BILIRUBIN UA: NEGATIVE
Glucose, UA: NEGATIVE
KETONES UA: NEGATIVE
NITRITE UA: NEGATIVE
PH UA: 7 (ref 5.0–8.0)
PROTEIN UA: NEGATIVE
Spec Grav, UA: 1.01 (ref 1.010–1.025)
Urobilinogen, UA: 0.2 E.U./dL

## 2017-04-09 NOTE — Progress Notes (Signed)
Subjective:    Patient ID: Erika Lynch, female    DOB: 1932/11/09, 83 y.o.   MRN: [id number]  HPI 83 year old female comes in today complaining of 8 days of abdominal pain. It started initially just in the suprapubic area and then started to radiate upward to the outer portions of the right and left lower abdomen. Now it's radiating up to her back. In fact about 2 days ago she had a severe pain between her shoulder blades. She tried putting a heating pad on it and that helped but didn't go away. She said even by the time she went to bed that night it was still there. She just feels like her abdomen is bloated and distended. She doesn't want to eat. If she even thinks about eating or tries to eat she feels very nauseated. She has not vomited. She has had more difficulty urinating recently. She has a prior history of uterine cancer and is status post hysterectomy. She still has her ovaries. She denies any fevers or sweats. She has some known kidney stones as well. She has had some chills, every evening. Most of her pain is suprapubic and RLQ aread.     Review of Systems  BP (!) 119/57   Pulse 68   Temp 97.9 F (36.6 C)   Wt 129 lb (58.5 kg)   SpO2 97%   BMI 22.85 kg/m     Allergies  Allergen Reactions  . Aspirin   . Cisapride   . Imipramine Hcl   . Mometasone Swelling    Throat swelling   . Penicillins Rash  . Sulfonamide Derivatives Rash    Past Medical History:  Diagnosis Date  . Dyskinesia of esophagus   . Hypertension   . Kidney stone   . Uterine cancer [facility])     Past Surgical History:  Procedure Laterality Date  . ABDOMINAL HYSTERECTOMY     Uterine cancer  . BACK SURGERY      Social History   Social History  . Marital status: Single    Spouse name: N/A  . Number of children: 4  . Years of education: N/A   Occupational History  . Retired.  Retired   Social History Main Topics  . Smoking status: Former Research scientist (life sciences)  . Smokeless tobacco: Never Used  .  Alcohol use No  . Drug use: No  . Sexual activity: Not on file   Other Topics Concern  . Not on file   Social History Narrative   3 living children: [name], Danny.  Daily caffeine use. Regular exercise.     Family History  Problem Relation Age of Onset  . Hypertension Father   . Hyperlipidemia Father   . Heart attack Father   . Stroke Mother   . Heart attack Brother   . Heart attack Brother   . Colon cancer Sister   . Breast cancer Unknown     Outpatient Encounter Prescriptions as of 04/09/2017  Medication Sig  . albuterol (PROVENTIL HFA;VENTOLIN HFA) 108 (90 Base) MCG/ACT inhaler Inhale 2 puffs into the lungs every 6 (six) hours as needed for wheezing or shortness of breath.  Marland Kitchen amLODipine (NORVASC) 5 MG tablet Take 1 tablet (5 mg total) by mouth daily.  . fluticasone (FLONASE) 50 MCG/ACT nasal spray Place 1 spray into both nostrils daily.  . metoprolol tartrate (LOPRESSOR) 50 MG tablet Take 1 tablet (50 mg total) by mouth 2 (two) times daily.  Marland Kitchen omeprazole (PRILOSEC) 40 MG capsule Take 1  capsule (40 mg total) by mouth daily.  . Tiotropium Bromide-Olodaterol (STIOLTO RESPIMAT) 2.5-2.5 MCG/ACT AERS Inhale 2 puffs into the lungs daily.  . vitamin B-12 (CYANOCOBALAMIN) 50 MCG tablet Take 50 mcg by mouth daily.  . [DISCONTINUED] acetaminophen (TYLENOL) 500 MG tablet Take 1 tablet (500 mg total) by mouth every 8 (eight) hours as needed.  . [DISCONTINUED] levofloxacin (LEVAQUIN) 250 MG tablet Take 1 tablet (250 mg total) by mouth daily.   No facility-administered encounter medications on file as of 04/09/2017.          Objective:   Physical Exam  Constitutional: She is oriented to person, place, and time. She appears well-developed and well-nourished.  HENT:  Head: Normocephalic and atraumatic.  Cardiovascular: Normal rate, regular rhythm and normal heart sounds.   Pulmonary/Chest: Effort normal and breath sounds normal.  Abdominal: Soft. Bowel sounds are normal. She  exhibits distension. She exhibits no mass. There is tenderness. There is no rebound and no guarding.    Tender pretty-much over the entire outer abdomen.  See marks on diagram.  Less tender near the umbilicus. Most tender in the right lower quadrant. No rebound or guarding.  Neurological: She is alert and oriented to person, place, and time.  Skin: Skin is warm and dry.  Psychiatric: She has a normal mood and affect. Her behavior is normal.        Assessment & Plan:  Diffuse abdominal pain that worsened the right lower quadrant 8 days. She's having to hold her abdomen to walk. She is afebrile and there is no guarding on  But I am concerned that she is gradually getting worse. She feels like her bowels are moving normally. She denies any constipation.prior history of uterine cancer. Weight is stable. We'll schedule for CT abdomen pelvis with contrast to evaluate for possible appendicitis versus ovarian cyst versus diverticulitis.  ossible kidney stone as well.she did have blood in her urinalysis today.

## 2017-04-09 NOTE — Telephone Encounter (Signed)
Katharine Look with [facility] called requesting that patient needs authorization for Imaging Stat Ct Scan. Katharine Look stated pt has [facility] replacement plan and it needs an authorization. Katharine Look request for you to call her back today if possible [phone]. Thanks

## 2017-04-09 NOTE — Telephone Encounter (Signed)
Pt called back for results of urine cx.  Given, pt still having sx, transferred to scheduling for follow up appointment.

## 2017-04-10 LAB — URINE CULTURE
MICRO NUMBER:: 80986767
SPECIMEN QUALITY:: ADEQUATE

## 2017-04-10 LAB — URINALYSIS, MICROSCOPIC ONLY
BACTERIA UA: NONE SEEN /HPF
Hyaline Cast: NONE SEEN /LPF
RBC / HPF: NONE SEEN /HPF (ref 0–2)

## 2017-04-12 NOTE — Telephone Encounter (Signed)
Pt notified of results & is okay with renal ultrasound, which I placed.  She states that she is feeling much better with the exception of the lower abdominal pain.  She takes tylenol for this with very little relief.

## 2017-04-12 NOTE — Telephone Encounter (Signed)
Call patient: CT abdomen is basically negative. There were absolutely no worrisome findings. No bowel obstruction. No diverticulitis. Normal appendix. No free fluid or swollen lymph nodes in the abdomen. There is a lesion on the right kidney and they do recommend ultrasound to work this up further but this would not be causing bilateral diffuse abdominal pain particularly lower abdominal pain as well as the back pain that she's been experiencing. What is she taking for pain relief? Is seh feeling any better?   Ok to order US renal to f/u lesion on right kidney if ok with that.

## 2017-04-13 MED ORDER — TRAMADOL HCL 50 MG PO TABS: 50.0000 mg | ORAL_TABLET | Freq: Three times a day (TID) | ORAL | 0 refills | Status: DC | PRN

## 2017-04-13 NOTE — Telephone Encounter (Signed)
I will send of her prescription for tramadol. She can ask a try taking this with her Tylenol and see if this provides a little bit more relief.

## 2017-04-13 NOTE — Telephone Encounter (Signed)
Pt notified of rx. 

## 2017-04-14 ENCOUNTER — Ambulatory Visit (INDEPENDENT_AMBULATORY_CARE_PROVIDER_SITE_OTHER): Payer: Medicare Other

## 2017-04-14 DIAGNOSIS — N200 Calculus of kidney: Secondary | ICD-10-CM

## 2017-05-06 ENCOUNTER — Encounter: Payer: Self-pay | Admitting: Family Medicine

## 2017-05-06 ENCOUNTER — Ambulatory Visit (INDEPENDENT_AMBULATORY_CARE_PROVIDER_SITE_OTHER): Payer: Medicare Other | Admitting: Family Medicine

## 2017-05-06 VITALS — BP 132/83 | HR 73 | Ht 63.0 in | Wt 127.0 lb

## 2017-05-06 DIAGNOSIS — J449 Chronic obstructive pulmonary disease, unspecified: Secondary | ICD-10-CM

## 2017-05-06 DIAGNOSIS — N183 Chronic kidney disease, stage 3 unspecified: Secondary | ICD-10-CM

## 2017-05-06 DIAGNOSIS — J4489 Other specified chronic obstructive pulmonary disease: Secondary | ICD-10-CM

## 2017-05-06 DIAGNOSIS — I10 Essential (primary) hypertension: Secondary | ICD-10-CM | POA: Diagnosis not present

## 2017-05-06 NOTE — Progress Notes (Signed)
Subjective:    CC: COPD, HTN HPI:  Erika Lynch is doing much better than when I saw her September 7 proximally 4 weeks ago. At that point in time Erika Lynch had a days of significant abdominal pain. Pain was radiating from her lower abdomen up into her shoulder blades. We obtained CT that very same day. Erika Lynch was noted to have some kidney stones in the right kidney but they were not in the ureter. They did see a question more area in the right kidneys to have recommended ultrasound for further workup. Patient eventually got better. Erika Lynch feels like Erika Lynch may have actually passed a kidney stone. Erika Lynch still has a little bit of soreness over her lower abdomen but overall is feeling much better.  F/U COPD - Erika Lynch is doing well. No recent flares or exacerbations.  Hypertension- Pt denies chest pain, SOB, dizziness, or heart palpitations.  Taking meds as directed w/o problems.  Denies medication side effects.    CKD3 with recent acute kidney injury.  Erika Lynch had her consultation at [facility] [place] with Dr. Margarita Lynch ENT. Fortunately by the time Erika Lynch was seen and reevaluated her creatinine had come back down to 1.12. And patient was feeling much better.  Past medical history, Surgical history, Family history not pertinant except as noted below, Social history, Allergies, and medications have been entered into the medical record, reviewed, and corrections made.   Review of Systems: No fevers, chills, night sweats, weight loss, chest pain, or shortness of breath.   Objective:    General: Well Developed, well nourished, and in no acute distress.  Neuro: Alert and oriented x3, extra-ocular muscles intact, sensation grossly intact.  HEENT: Normocephalic, atraumatic  Skin: Warm and dry, no rashes. Cardiac: Regular rate and rhythm, no murmurs rubs or gallops, no lower extremity edema.  Respiratory: Clear to auscultation bilaterally. Not using accessory muscles, speaking in full sentences.   Impression and  Recommendations:    COPD - Stable. Doing well. No recent flares. Flu shot up-to-date.  HTN - Well controlled. Continue current regimen. Follow up in  6 months   CKD 3 - Creatint back down to 1.2 on 04/21/2017.  Plean to recheck in 3-4 months. In jan . Erika Lynch will be to avoid NSAIDs if possible. Erika Lynch will follow yearly with nephrology.

## 2017-07-21 ENCOUNTER — Other Ambulatory Visit: Payer: Self-pay | Admitting: *Deleted

## 2017-07-21 MED ORDER — SPIRIVA HANDIHALER 18 MCG IN CAPS: ORAL_CAPSULE | RESPIRATORY_TRACT | 3 refills | Status: DC

## 2017-07-21 MED ORDER — OMEPRAZOLE 40 MG PO CPDR: 40.0000 mg | DELAYED_RELEASE_CAPSULE | Freq: Every day | ORAL | 1 refills | Status: DC

## 2017-08-09 ENCOUNTER — Telehealth: Payer: Self-pay

## 2017-08-09 NOTE — Telephone Encounter (Signed)
[facility] called and wanted to confirm patient should be on Spiriva and Stiolto. Please advise.    #[id number] Order # [place]

## 2017-08-09 NOTE — Telephone Encounter (Signed)
Should ONly be on Stiolto.  I am not sure why the spiriva was refilled in December.

## 2017-08-10 NOTE — Telephone Encounter (Signed)
Pharmacy advised. Spiriva Rx cancelled. Removed from chart as well.

## 2017-08-19 ENCOUNTER — Encounter: Payer: Self-pay | Admitting: Family Medicine

## 2017-08-19 ENCOUNTER — Ambulatory Visit: Payer: Medicare Other | Admitting: Family Medicine

## 2017-08-19 VITALS — BP 138/60 | HR 71 | Ht 63.0 in | Wt 127.0 lb

## 2017-08-19 DIAGNOSIS — I10 Essential (primary) hypertension: Secondary | ICD-10-CM | POA: Diagnosis not present

## 2017-08-19 DIAGNOSIS — H9202 Otalgia, left ear: Secondary | ICD-10-CM

## 2017-08-19 DIAGNOSIS — N183 Chronic kidney disease, stage 3 unspecified: Secondary | ICD-10-CM

## 2017-08-19 DIAGNOSIS — M659 Synovitis and tenosynovitis, unspecified: Secondary | ICD-10-CM

## 2017-08-19 DIAGNOSIS — J449 Chronic obstructive pulmonary disease, unspecified: Secondary | ICD-10-CM

## 2017-08-19 LAB — POCT UA - MICROALBUMIN
CREATININE, POC: 50 mg/dL
Microalbumin Ur, POC: 10 mg/L

## 2017-08-19 MED ORDER — AMLODIPINE BESYLATE 5 MG PO TABS: 5.0000 mg | ORAL_TABLET | Freq: Every day | ORAL | 1 refills | Status: DC

## 2017-08-19 NOTE — Progress Notes (Signed)
Subjective:    Patient ID: AIREANA RYLAND, female    DOB: Feb 19, 1933, 84 y.o.   MRN: [id number]  HPI F/U COPD -she is doing really well.  She had a prescription for Spiriva and for Stiolto.  We had called her to let her know to only use his Stiolto.  She is been doing really well and has not had any flares.  Her flu shot is up-to-date.  Hypertension- Pt denies chest pain, SOB, dizziness, or heart palpitations.  Taking meds as directed w/o problems.  Denies medication side effects.    CKD 3-last creatinine was 1.2.  She follows yearly with nephrology.  She did want to let me know that she has some vertigo in her left ear again.  She says usually once it starts at last about 4 weeks.  She tried to do some of the exercises that she learned in therapy and says that really did help.  She complains of some left ear pain that started last night.  But it seems to have resolved.  It has not bothered her at all today.  She does not hear well out of that ear but denies any acute changes and denies any drainage.  She has not had any upper respiratory symptoms.    About 3 weeks ago she got some abrupt swelling over several of the MCP joints in her right hand as well as some persistent swelling over the PIP joint in her middle finger.  She says she constantly has some swelling in the middle finger joint but usually never in the MCPs.  She says it was extremely stiff and was like it was spasming and would have to soak it in warm water to be able to move her hand.  She is instructed in Epsom salts.  She has been doing some puzzles lately but denies any excessive or overuse of the hand, injury, or trauma. Okay   Review of Systems  BP 138/60   Pulse 71   Ht 5\' 3"  (1.6 m)   Wt 127 lb (57.6 kg)   SpO2 100%   BMI 22.50 kg/m     Allergies  Allergen Reactions  . Aspirin   . Cisapride   . Imipramine Hcl   . Mometasone Swelling    Throat swelling   . Penicillins Rash  . Sulfonamide Derivatives Rash     Past Medical History:  Diagnosis Date  . Dyskinesia of esophagus   . Hypertension   . Kidney stone   . Uterine cancer [facility])     Past Surgical History:  Procedure Laterality Date  . ABDOMINAL HYSTERECTOMY     Uterine cancer  . BACK SURGERY      Social History   Socioeconomic History  . Marital status: Single    Spouse name: Not on file  . Number of children: 4  . Years of education: Not on file  . Highest education level: Not on file  Social Needs  . Financial resource strain: Not on file  . Food insecurity - worry: Not on file  . Food insecurity - inability: Not on file  . Transportation needs - medical: Not on file  . Transportation needs - non-medical: Not on file  Occupational History  . Occupation: Retired.     Employer: RETIRED  Tobacco Use  . Smoking status: Former Research scientist (life sciences)  . Smokeless tobacco: Never Used  Substance and Sexual Activity  . Alcohol use: No  . Drug use: No  . Sexual activity: Not  on file  Other Topics Concern  . Not on file  Social History Narrative   3 living children: [name], Danny.  Daily caffeine use. Regular exercise.     Family History  Problem Relation Age of Onset  . Hypertension Father   . Hyperlipidemia Father   . Heart attack Father   . Stroke Mother   . Heart attack Brother   . Heart attack Brother   . Colon cancer Sister   . Breast cancer Unknown     Outpatient Encounter Medications as of 08/19/2017  Medication Sig  . albuterol (PROVENTIL HFA;VENTOLIN HFA) 108 (90 Base) MCG/ACT inhaler Inhale 2 puffs into the lungs every 6 (six) hours as needed for wheezing or shortness of breath.  Marland Kitchen amLODipine (NORVASC) 5 MG tablet Take 1 tablet (5 mg total) by mouth daily.  . fluticasone (FLONASE) 50 MCG/ACT nasal spray Place 1 spray into both nostrils daily.  . metoprolol tartrate (LOPRESSOR) 50 MG tablet Take 1 tablet (50 mg total) by mouth 2 (two) times daily.  Marland Kitchen omeprazole (PRILOSEC) 40 MG capsule Take 1 capsule (40 mg  total) by mouth daily.  . Tiotropium Bromide-Olodaterol (STIOLTO RESPIMAT) 2.5-2.5 MCG/ACT AERS Inhale 2 puffs into the lungs daily.  . traMADol (ULTRAM) 50 MG tablet Take 1 tablet (50 mg total) by mouth every 8 (eight) hours as needed.  . vitamin B-12 (CYANOCOBALAMIN) 50 MCG tablet Take 50 mcg by mouth daily.  . [DISCONTINUED] amLODipine (NORVASC) 5 MG tablet Take 1 tablet (5 mg total) by mouth daily.   No facility-administered encounter medications on file as of 08/19/2017.          Objective:   Physical Exam  Constitutional: She is oriented to person, place, and time. She appears well-developed and well-nourished.  HENT:  Head: Normocephalic and atraumatic.  Neck: Neck supple. No thyromegaly present.  Cardiovascular: Normal rate, regular rhythm and normal heart sounds.  Pulmonary/Chest: Effort normal and breath sounds normal.  Lymphadenopathy:    She has no cervical adenopathy.  Neurological: She is alert and oriented to person, place, and time.  Skin: Skin is warm and dry.  Psychiatric: She has a normal mood and affect. Her behavior is normal.          Assessment & Plan:  COPD - Stable on Stiolto.    HTN -  Well controlled. Continue current regimen. Follow up in 5 months.    Synovitis-we will check CBC and inflammatory markers.  It mostly has resolved except for the DIP joint her middle finger that is more chronic.  CKD 3-due to recheck renal function.  Left ear pain - partial view of TM is clear.  She has some wax blocking your view.    Recurrent benign positional vertigo-just encouraged her to continue to do the exercises when she gets recurrences.  If it is severe or not resolving we can always get her back into PT again which she did well with.

## 2017-08-20 LAB — BASIC METABOLIC PANEL WITH GFR
BUN/Creatinine Ratio: 14 (calc) (ref 6–22)
BUN: 14 mg/dL (ref 7–25)
CALCIUM: 10.2 mg/dL (ref 8.6–10.4)
CHLORIDE: 102 mmol/L (ref 98–110)
CO2: 28 mmol/L (ref 20–32)
Creat: 1.03 mg/dL — ABNORMAL HIGH (ref 0.60–0.88)
GFR, EST AFRICAN AMERICAN: 59 mL/min/{1.73_m2} — AB (ref >=60)
GFR, Est Non African American: 51 mL/min/{1.73_m2} — ABNORMAL LOW (ref >=60)
GLUCOSE: 96 mg/dL (ref 65–99)
POTASSIUM: 4.8 mmol/L (ref 3.5–5.3)
Sodium: 139 mmol/L (ref 135–146)

## 2017-08-20 LAB — CBC
HEMATOCRIT: 43.4 % (ref 35.0–45.0)
HEMOGLOBIN: 15.1 g/dL (ref 11.7–15.5)
MCH: 31.8 pg (ref 27.0–33.0)
MCHC: 34.8 g/dL (ref 32.0–36.0)
MCV: 91.4 fL (ref 80.0–100.0)
MPV: 10.9 fL (ref 7.5–12.5)
Platelets: 304 10*3/uL (ref 140–400)
RBC: 4.75 10*6/uL (ref 3.80–5.10)
RDW: 13.2 % (ref 11.0–15.0)
WBC: 10.5 10*3/uL (ref 3.8–10.8)

## 2017-08-20 LAB — SEDIMENTATION RATE: SED RATE: 11 mm/h (ref 0–30)

## 2017-08-20 LAB — C-REACTIVE PROTEIN: CRP: 2 mg/L (ref <8.0)

## 2017-09-01 ENCOUNTER — Other Ambulatory Visit: Payer: Self-pay | Admitting: *Deleted

## 2017-09-01 DIAGNOSIS — J449 Chronic obstructive pulmonary disease, unspecified: Secondary | ICD-10-CM

## 2017-09-01 MED ORDER — TIOTROPIUM BROMIDE-OLODATEROL 2.5-2.5 MCG/ACT IN AERS: 2.0000 | INHALATION_SPRAY | Freq: Every day | RESPIRATORY_TRACT | 3 refills | Status: DC

## 2017-12-16 DIAGNOSIS — J322 Chronic ethmoidal sinusitis: Secondary | ICD-10-CM | POA: Diagnosis not present

## 2017-12-16 DIAGNOSIS — J040 Acute laryngitis: Secondary | ICD-10-CM | POA: Diagnosis not present

## 2017-12-16 DIAGNOSIS — J301 Allergic rhinitis due to pollen: Secondary | ICD-10-CM | POA: Diagnosis not present

## 2017-12-16 DIAGNOSIS — J320 Chronic maxillary sinusitis: Secondary | ICD-10-CM | POA: Diagnosis not present

## 2017-12-16 DIAGNOSIS — J342 Deviated nasal septum: Secondary | ICD-10-CM | POA: Diagnosis not present

## 2017-12-23 DIAGNOSIS — J040 Acute laryngitis: Secondary | ICD-10-CM | POA: Diagnosis not present

## 2017-12-23 DIAGNOSIS — H9312 Tinnitus, left ear: Secondary | ICD-10-CM | POA: Diagnosis not present

## 2017-12-23 DIAGNOSIS — H8142 Vertigo of central origin, left ear: Secondary | ICD-10-CM | POA: Diagnosis not present

## 2017-12-23 DIAGNOSIS — J322 Chronic ethmoidal sinusitis: Secondary | ICD-10-CM | POA: Diagnosis not present

## 2017-12-23 DIAGNOSIS — J320 Chronic maxillary sinusitis: Secondary | ICD-10-CM | POA: Diagnosis not present

## 2017-12-28 ENCOUNTER — Other Ambulatory Visit: Payer: Self-pay | Admitting: Family Medicine

## 2017-12-30 DIAGNOSIS — J322 Chronic ethmoidal sinusitis: Secondary | ICD-10-CM | POA: Diagnosis not present

## 2017-12-30 DIAGNOSIS — J301 Allergic rhinitis due to pollen: Secondary | ICD-10-CM | POA: Diagnosis not present

## 2017-12-30 DIAGNOSIS — J320 Chronic maxillary sinusitis: Secondary | ICD-10-CM | POA: Diagnosis not present

## 2017-12-30 DIAGNOSIS — J410 Simple chronic bronchitis: Secondary | ICD-10-CM | POA: Diagnosis not present

## 2017-12-30 DIAGNOSIS — K210 Gastro-esophageal reflux disease with esophagitis: Secondary | ICD-10-CM | POA: Diagnosis not present

## 2018-01-15 ENCOUNTER — Other Ambulatory Visit: Payer: Self-pay | Admitting: Family Medicine

## 2018-01-20 ENCOUNTER — Ambulatory Visit: Payer: Medicare Other | Admitting: Family Medicine

## 2018-01-30 ENCOUNTER — Other Ambulatory Visit: Payer: Self-pay | Admitting: Family Medicine

## 2018-01-30 DIAGNOSIS — I10 Essential (primary) hypertension: Secondary | ICD-10-CM

## 2018-01-31 ENCOUNTER — Ambulatory Visit (INDEPENDENT_AMBULATORY_CARE_PROVIDER_SITE_OTHER): Payer: Medicare HMO | Admitting: Family Medicine

## 2018-01-31 ENCOUNTER — Encounter: Payer: Self-pay | Admitting: Family Medicine

## 2018-01-31 VITALS — BP 131/58 | HR 61 | Ht 63.0 in | Wt 134.0 lb

## 2018-01-31 DIAGNOSIS — J449 Chronic obstructive pulmonary disease, unspecified: Secondary | ICD-10-CM | POA: Diagnosis not present

## 2018-01-31 DIAGNOSIS — N1831 Chronic kidney disease, stage 3a: Secondary | ICD-10-CM

## 2018-01-31 DIAGNOSIS — J309 Allergic rhinitis, unspecified: Secondary | ICD-10-CM | POA: Diagnosis not present

## 2018-01-31 DIAGNOSIS — N183 Chronic kidney disease, stage 3 unspecified: Secondary | ICD-10-CM

## 2018-01-31 DIAGNOSIS — I10 Essential (primary) hypertension: Secondary | ICD-10-CM | POA: Diagnosis not present

## 2018-01-31 DIAGNOSIS — R42 Dizziness and giddiness: Secondary | ICD-10-CM

## 2018-01-31 NOTE — Progress Notes (Signed)
Subjective:    CC: BP and kidneys.    HPI:  Hypertension- Pt denies chest pain, SOB, dizziness, or heart palpitations.  Taking meds as directed w/o problems.  Denies medication side effects.    F/I COPD - Stable. No recent flares.  No SOB.    F/U CKD 3 - No changes in urination.   She was having left ear problems and saw an ENT specialist. She was given doxy, prednisone, meclizine.  She felt like she was dizzy but also felt like her head was throbbing.  She would feel her heartbeat in that left ear when she would lay on that side.  She actually saw the ENT, Dr. Claria Dice 3 times.  She is feeling some better now.  She is noticed the dizziness more when she stands up or changes position she said just turning her head does not seem to trigger or bother her.  She has been having some neck pain.  She does have a history of cervical disc disease.  She says is radiating down towards her left shoulder.  Says the ENT did a laryngoscopy which did show some erythema.  He did recommend that she also start an over-the-counter allergy pill. She feels like she hydrates well. She takes her PPI daily.    Past medical history, Surgical history, Family history not pertinant except as noted below, Social history, Allergies, and medications have been entered into the medical record, reviewed, and corrections made.   Review of Systems: No fevers, chills, night sweats, weight loss, chest pain, or shortness of breath.   Objective:    General: Well Developed, well nourished, and in no acute distress.  Neuro: Alert and oriented x3, extra-ocular muscles intact, sensation grossly intact.  HEENT: Normocephalic, atraumatic. TMS and canals are clear bilaterally.  Skin: Warm and dry, no rashes. OP is clear.   Cardiac: Regular rate and rhythm, no murmurs rubs or gallops, no lower extremity edema.  Respiratory: Clear to auscultation bilaterally. Not using accessory muscles, speaking in full sentences.   Impression and  Recommendations:    HTN - Well controlled. Continue current regimen. Follow up in  6 months. Check lipids as well ast CMP.    COPD - Well controlled. Continue current regimen. Follow up in  24months.    Stage 3 CKD- due to recheck Cr.     allergic rhinitis-recommend a trial of over-the-counter Claritin.  Dizziness-may be from her ear though she did mention it happens more with position change the orthostatics performed today.  Orthostatics were normal and looks fantastic today.  So no sign of hypotension secondary to orthostasis.  Cervical pain with radiculapathy to the left - continue heating pad and stretches.

## 2018-01-31 NOTE — Patient Instructions (Signed)
Commended trial of over-the-counter Claritin.  Okay to buy the store brand, for allergies.

## 2018-02-01 DIAGNOSIS — N183 Chronic kidney disease, stage 3 (moderate): Secondary | ICD-10-CM | POA: Diagnosis not present

## 2018-02-01 DIAGNOSIS — I10 Essential (primary) hypertension: Secondary | ICD-10-CM | POA: Diagnosis not present

## 2018-02-01 LAB — COMPLETE METABOLIC PANEL WITH GFR
AG Ratio: 1.8 (calc) (ref 1.0–2.5)
ALBUMIN MSPROF: 4.4 g/dL (ref 3.6–5.1)
ALKALINE PHOSPHATASE (APISO): 61 U/L (ref 33–130)
ALT: 13 U/L (ref 6–29)
AST: 17 U/L (ref 10–35)
BILIRUBIN TOTAL: 0.5 mg/dL (ref 0.2–1.2)
BUN / CREAT RATIO: 12 (calc) (ref 6–22)
BUN: 13 mg/dL (ref 7–25)
CHLORIDE: 104 mmol/L (ref 98–110)
CO2: 29 mmol/L (ref 20–32)
Calcium: 10.4 mg/dL (ref 8.6–10.4)
Creat: 1.12 mg/dL — ABNORMAL HIGH (ref 0.60–0.88)
GFR, EST AFRICAN AMERICAN: 53 mL/min/{1.73_m2} — AB (ref >=60)
GFR, Est Non African American: 45 mL/min/{1.73_m2} — ABNORMAL LOW (ref >=60)
GLOBULIN: 2.5 g/dL (ref 1.9–3.7)
Glucose, Bld: 100 mg/dL — ABNORMAL HIGH (ref 65–99)
Potassium: 5.1 mmol/L (ref 3.5–5.3)
SODIUM: 141 mmol/L (ref 135–146)
Total Protein: 6.9 g/dL (ref 6.1–8.1)

## 2018-02-01 LAB — LIPID PANEL
CHOLESTEROL: 249 mg/dL — AB (ref <200)
HDL: 47 mg/dL — ABNORMAL LOW (ref >50)
LDL Cholesterol (Calc): 166 mg/dL (calc) — ABNORMAL HIGH
Non-HDL Cholesterol (Calc): 202 mg/dL (calc) — ABNORMAL HIGH (ref <130)
Total CHOL/HDL Ratio: 5.3 (calc) — ABNORMAL HIGH (ref <5.0)
Triglycerides: 202 mg/dL — ABNORMAL HIGH (ref <150)

## 2018-02-02 ENCOUNTER — Other Ambulatory Visit: Payer: Self-pay | Admitting: Family Medicine

## 2018-02-28 DIAGNOSIS — H2513 Age-related nuclear cataract, bilateral: Secondary | ICD-10-CM | POA: Diagnosis not present

## 2018-02-28 DIAGNOSIS — H5203 Hypermetropia, bilateral: Secondary | ICD-10-CM | POA: Diagnosis not present

## 2018-03-28 DIAGNOSIS — H5203 Hypermetropia, bilateral: Secondary | ICD-10-CM | POA: Diagnosis not present

## 2018-03-28 DIAGNOSIS — H524 Presbyopia: Secondary | ICD-10-CM | POA: Diagnosis not present

## 2018-03-28 DIAGNOSIS — H52209 Unspecified astigmatism, unspecified eye: Secondary | ICD-10-CM | POA: Diagnosis not present

## 2018-04-01 ENCOUNTER — Other Ambulatory Visit: Payer: Self-pay | Admitting: *Deleted

## 2018-04-01 DIAGNOSIS — J449 Chronic obstructive pulmonary disease, unspecified: Secondary | ICD-10-CM

## 2018-04-01 DIAGNOSIS — I10 Essential (primary) hypertension: Secondary | ICD-10-CM

## 2018-04-01 MED ORDER — MECLIZINE HCL 25 MG PO TABS: ORAL_TABLET | ORAL | 0 refills | Status: DC

## 2018-04-01 MED ORDER — TIOTROPIUM BROMIDE-OLODATEROL 2.5-2.5 MCG/ACT IN AERS: 2.0000 | INHALATION_SPRAY | Freq: Every day | RESPIRATORY_TRACT | 3 refills | Status: DC

## 2018-04-01 MED ORDER — AMLODIPINE BESYLATE 5 MG PO TABS: 5.0000 mg | ORAL_TABLET | Freq: Every day | ORAL | 1 refills | Status: DC

## 2018-04-01 MED ORDER — METOPROLOL TARTRATE 50 MG PO TABS: 50.0000 mg | ORAL_TABLET | Freq: Two times a day (BID) | ORAL | 1 refills | Status: DC

## 2018-04-01 MED ORDER — OMEPRAZOLE 40 MG PO CPDR: 40.0000 mg | DELAYED_RELEASE_CAPSULE | Freq: Every day | ORAL | 3 refills | Status: DC

## 2018-04-01 MED ORDER — FLUTICASONE PROPIONATE 50 MCG/ACT NA SUSP: NASAL | 3 refills | Status: DC

## 2018-04-05 ENCOUNTER — Other Ambulatory Visit: Payer: Self-pay | Admitting: *Deleted

## 2018-04-05 MED ORDER — MECLIZINE HCL 25 MG PO TABS: 25.0000 mg | ORAL_TABLET | Freq: Every day | ORAL | 0 refills | Status: DC | PRN

## 2018-04-12 ENCOUNTER — Other Ambulatory Visit: Payer: Self-pay | Admitting: *Deleted

## 2018-04-12 MED ORDER — MECLIZINE HCL 25 MG PO TABS: 25.0000 mg | ORAL_TABLET | Freq: Every day | ORAL | 0 refills | Status: DC | PRN

## 2018-04-21 DIAGNOSIS — R6889 Other general symptoms and signs: Secondary | ICD-10-CM | POA: Diagnosis not present

## 2018-07-04 ENCOUNTER — Encounter: Payer: Self-pay | Admitting: Sports Medicine

## 2018-07-04 ENCOUNTER — Ambulatory Visit (INDEPENDENT_AMBULATORY_CARE_PROVIDER_SITE_OTHER): Payer: Medicare HMO | Admitting: Sports Medicine

## 2018-07-04 DIAGNOSIS — M48061 Spinal stenosis, lumbar region without neurogenic claudication: Secondary | ICD-10-CM

## 2018-07-04 DIAGNOSIS — M5030 Other cervical disc degeneration, unspecified cervical region: Secondary | ICD-10-CM

## 2018-07-04 MED ORDER — CYCLOBENZAPRINE HCL 10 MG PO TABS: ORAL_TABLET | ORAL | 0 refills | Status: DC

## 2018-07-04 MED ORDER — PREDNISONE 50 MG PO TABS: ORAL_TABLET | ORAL | 0 refills | Status: DC

## 2018-07-04 NOTE — Assessment & Plan Note (Signed)
Left periscapular radicular symptoms. Prednisone and Flexeril as below. Home rehab exercises, return in 4 to 6 weeks, MRI for interventional planning if no better.

## 2018-07-04 NOTE — Assessment & Plan Note (Signed)
Axial discogenic pain. Prednisone, Flexeril per her request. Home rehab exercises, return in 4 to 6 weeks, MR for interventional planning if no better.

## 2018-07-04 NOTE — Progress Notes (Signed)
Subjective:    CC: Back pain  HPI: This is an 84 year old female, she has a longstanding on and off multifactorial back pain.  More recently she is had worsening of pain in her low back, worse with sitting, flexion, Valsalva with occasional radiation down the leg on the left.  No bowel or bladder dysfunction, saddle numbness, constitutional symptoms.  In addition she developed pain in her neck with radiation around the left periscapular region but nothing overtly down to the hand or fingertips, no weakness, not dropping things.  I reviewed the past medical history, family history, social history, surgical history, and allergies today and no changes were needed.  Please see the problem list section below in epic for further details.  Past Medical History: Past Medical History:  Diagnosis Date  . Dyskinesia of esophagus   . Hypertension   . Kidney stone   . Uterine cancer [facility])    Past Surgical History: Past Surgical History:  Procedure Laterality Date  . ABDOMINAL HYSTERECTOMY     Uterine cancer  . BACK SURGERY     Social History: Social History   Socioeconomic History  . Marital status: Single    Spouse name: Not on file  . Number of children: 4  . Years of education: Not on file  . Highest education level: Not on file  Occupational History  . Occupation: Retired.     Employer: RETIRED  Social Needs  . Financial resource strain: Not on file  . Food insecurity:    Worry: Not on file    Inability: Not on file  . Transportation needs:    Medical: Not on file    Non-medical: Not on file  Tobacco Use  . Smoking status: Former Research scientist (life sciences)  . Smokeless tobacco: Never Used  Substance and Sexual Activity  . Alcohol use: No  . Drug use: No  . Sexual activity: Not on file  Lifestyle  . Physical activity:    Days per week: Not on file    Minutes per session: Not on file  . Stress: Not on file  Relationships  . Social connections:    Talks on phone: Not on file    Gets  together: Not on file    Attends religious service: Not on file    Active member of club or organization: Not on file    Attends meetings of clubs or organizations: Not on file    Relationship status: Not on file  Other Topics Concern  . Not on file  Social History Narrative   3 living children: [name], Danny.  Daily caffeine use. Regular exercise.    Family History: Family History  Problem Relation Age of Onset  . Hypertension Father   . Hyperlipidemia Father   . Heart attack Father   . Stroke Mother   . Heart attack Brother   . Heart attack Brother   . Colon cancer Sister   . Breast cancer Unknown    Allergies: Allergies  Allergen Reactions  . Aspirin   . Cisapride   . Imipramine Hcl   . Mometasone Swelling    Throat swelling   . Penicillins Rash  . Sulfonamide Derivatives Rash   Medications: See med rec.  Review of Systems: No fevers, chills, night sweats, weight loss, chest pain, or shortness of breath.   Objective:    General: Well Developed, well nourished, and in no acute distress.  Neuro: Alert and oriented x3, extra-ocular muscles intact, sensation grossly intact.  HEENT: Normocephalic, atraumatic,  pupils equal round reactive to light, neck supple, no masses, no lymphadenopathy, thyroid nonpalpable.  Skin: Warm and dry, no rashes. Cardiac: Regular rate and rhythm, no murmurs rubs or gallops, no lower extremity edema.  Respiratory: Clear to auscultation bilaterally. Not using accessory muscles, speaking in full sentences. Back Exam:  Inspection: Unremarkable  Motion: Flexion 45 deg, Extension 45 deg, Side Bending to 45 deg bilaterally,  Rotation to 45 deg bilaterally  SLR laying: Negative  XSLR laying: Negative  Palpable tenderness: None. FABER: negative. Sensory change: Gross sensation intact to all lumbar and sacral dermatomes.  Reflexes: 2+ at both patellar tendons, 2+ at achilles tendons, Babinski's downgoing.  Strength at foot    Plantar-flexion: 5/5 Dorsi-flexion: 5/5 Eversion: 5/5 Inversion: 5/5  Leg strength  Quad: 5/5 Hamstring: 5/5 Hip flexor: 5/5 Hip abductors: 5/5  Gait unremarkable. Neck: Negative spurling's Full neck range of motion Grip strength and sensation normal in bilateral hands Strength good C4 to T1 distribution No sensory change to C4 to T1 Reflexes normal  Impression and Recommendations:    Lumbar spinal stenosis Axial discogenic pain. Prednisone, Flexeril per her request. Home rehab exercises, return in 4 to 6 weeks, MR for interventional planning if no better.  DDD (degenerative disc disease), cervical Left periscapular radicular symptoms. Prednisone and Flexeril as below. Home rehab exercises, return in 4 to 6 weeks, MRI for interventional planning if no better. ___________________________________________ Gwen Her. Dianah Field, M.D., ABFM., CAQSM. Primary Care and Sports Medicine [facility]  Adjunct Professor of [place] [employer] [employer] Medicine

## 2018-07-13 ENCOUNTER — Telehealth: Payer: Self-pay

## 2018-07-13 DIAGNOSIS — M48061 Spinal stenosis, lumbar region without neurogenic claudication: Secondary | ICD-10-CM

## 2018-07-13 MED ORDER — TRAMADOL HCL 50 MG PO TABS: 50.0000 mg | ORAL_TABLET | Freq: Three times a day (TID) | ORAL | 0 refills | Status: DC | PRN

## 2018-07-13 NOTE — Telephone Encounter (Signed)
Tenea called and states the back pain is not any better. The prednisone and flexeril is not helping. Please advise.

## 2018-07-13 NOTE — Telephone Encounter (Signed)
Adding some tramadol, and formal physical therapy rather than home exercises.

## 2018-07-15 NOTE — Telephone Encounter (Signed)
Patient is aware 

## 2018-08-08 ENCOUNTER — Ambulatory Visit (INDEPENDENT_AMBULATORY_CARE_PROVIDER_SITE_OTHER): Payer: Medicare HMO | Admitting: Sports Medicine

## 2018-08-08 ENCOUNTER — Encounter: Payer: Self-pay | Admitting: Family Medicine

## 2018-08-08 ENCOUNTER — Ambulatory Visit (INDEPENDENT_AMBULATORY_CARE_PROVIDER_SITE_OTHER): Payer: Medicare HMO | Admitting: Family Medicine

## 2018-08-08 VITALS — BP 130/60 | HR 69 | Ht 63.0 in | Wt 130.0 lb

## 2018-08-08 DIAGNOSIS — M5030 Other cervical disc degeneration, unspecified cervical region: Secondary | ICD-10-CM | POA: Diagnosis not present

## 2018-08-08 DIAGNOSIS — J449 Chronic obstructive pulmonary disease, unspecified: Secondary | ICD-10-CM | POA: Diagnosis not present

## 2018-08-08 DIAGNOSIS — M48061 Spinal stenosis, lumbar region without neurogenic claudication: Secondary | ICD-10-CM | POA: Diagnosis not present

## 2018-08-08 DIAGNOSIS — R7309 Other abnormal glucose: Secondary | ICD-10-CM | POA: Diagnosis not present

## 2018-08-08 DIAGNOSIS — N183 Chronic kidney disease, stage 3 (moderate): Secondary | ICD-10-CM

## 2018-08-08 DIAGNOSIS — N1831 Chronic kidney disease, stage 3a: Secondary | ICD-10-CM

## 2018-08-08 DIAGNOSIS — I10 Essential (primary) hypertension: Secondary | ICD-10-CM

## 2018-08-08 DIAGNOSIS — Z23 Encounter for immunization: Secondary | ICD-10-CM | POA: Diagnosis not present

## 2018-08-08 DIAGNOSIS — T50905A Adverse effect of unspecified drugs, medicaments and biological substances, initial encounter: Secondary | ICD-10-CM | POA: Diagnosis not present

## 2018-08-08 DIAGNOSIS — R7301 Impaired fasting glucose: Secondary | ICD-10-CM

## 2018-08-08 LAB — POCT GLYCOSYLATED HEMOGLOBIN (HGB A1C): Hemoglobin A1C: 6 % — AB (ref 4.0–5.6)

## 2018-08-08 NOTE — Assessment & Plan Note (Signed)
Resolved with conservative measures, return as needed.

## 2018-08-08 NOTE — Progress Notes (Signed)
Subjective:    CC: Follow-up  HPI: Neck and back pain: Resolved with conservative treatment.  She is now only using a few Tylenol.  I reviewed the past medical history, family history, social history, surgical history, and allergies today and no changes were needed.  Please see the problem list section below in epic for further details.  Past Medical History: Past Medical History:  Diagnosis Date  . Dyskinesia of esophagus   . Hypertension   . Kidney stone   . Uterine cancer [facility])    Past Surgical History: Past Surgical History:  Procedure Laterality Date  . ABDOMINAL HYSTERECTOMY     Uterine cancer  . BACK SURGERY     Social History: Social History   Socioeconomic History  . Marital status: Single    Spouse name: Not on file  . Number of children: 4  . Years of education: Not on file  . Highest education level: Not on file  Occupational History  . Occupation: Retired.     Employer: RETIRED  Social Needs  . Financial resource strain: Not on file  . Food insecurity:    Worry: Not on file    Inability: Not on file  . Transportation needs:    Medical: Not on file    Non-medical: Not on file  Tobacco Use  . Smoking status: Former Research scientist (life sciences)  . Smokeless tobacco: Never Used  Substance and Sexual Activity  . Alcohol use: No  . Drug use: No  . Sexual activity: Not on file  Lifestyle  . Physical activity:    Days per week: Not on file    Minutes per session: Not on file  . Stress: Not on file  Relationships  . Social connections:    Talks on phone: Not on file    Gets together: Not on file    Attends religious service: Not on file    Active member of club or organization: Not on file    Attends meetings of clubs or organizations: Not on file    Relationship status: Not on file  Other Topics Concern  . Not on file  Social History Narrative   3 living children: [name], Danny.  Daily caffeine use. Regular exercise.    Family History: Family History    Problem Relation Age of Onset  . Hypertension Father   . Hyperlipidemia Father   . Heart attack Father   . Stroke Mother   . Heart attack Brother   . Heart attack Brother   . Colon cancer Sister   . Breast cancer Unknown    Allergies: Allergies  Allergen Reactions  . Aspirin   . Cisapride   . Imipramine Hcl   . Mometasone Swelling    Throat swelling   . Penicillins Rash  . Sulfonamide Derivatives Rash   Medications: See med rec.  Review of Systems: No fevers, chills, night sweats, weight loss, chest pain, or shortness of breath.   Objective:    General: Well Developed, well nourished, and in no acute distress.  Neuro: Alert and oriented x3, extra-ocular muscles intact, sensation grossly intact.  HEENT: Normocephalic, atraumatic, pupils equal round reactive to light, neck supple, no masses, no lymphadenopathy, thyroid nonpalpable.  Skin: Warm and dry, no rashes. Cardiac: Regular rate and rhythm, no murmurs rubs or gallops, no lower extremity edema.  Respiratory: Clear to auscultation bilaterally. Not using accessory muscles, speaking in full sentences.  Impression and Recommendations:    Lumbar spinal stenosis Axial discogenic back pain now resolved  with Flexeril, prednisone, rehab exercises, heating pad. She now only needs a bit of Tylenol, return as needed for this.  DDD (degenerative disc disease), cervical Resolved with conservative measures, return as needed. ___________________________________________ Gwen Her. Dianah Field, M.D., ABFM., CAQSM. Primary Care and Sports Medicine [facility]  Adjunct Professor of [place] [employer] [employer] Medicine

## 2018-08-08 NOTE — Progress Notes (Signed)
Subjective:    CC: BP and COPD  HPI:  Hypertension- Pt denies chest pain, SOB, dizziness, or heart palpitations.  Taking meds as directed w/o problems.  Denies medication side effects.    F/U COPD with asthma -is actually doing really well she has not had any flares or exacerbations.  No wheezing.  She still using her Stiolto daily.  F/U CKD 3 -to have some increased urinary frequency while she was on 1 of the medications that caused side effects.  Please see note below.  Medication side effect to either the Flexeril or the prednisone that she was put on recently.  Says that it caused her mouth to swell, her lips to crack and had urinary frequency and incontinence.  It has gradually gotten better since she stopped the medication  Past medical history, Surgical history, Family history not pertinant except as noted below, Social history, Allergies, and medications have been entered into the medical record, reviewed, and corrections made.   Review of Systems: No fevers, chills, night sweats, weight loss, chest pain, or shortness of breath.   Objective:    General: Well Developed, well nourished, and in no acute distress.  Neuro: Alert and oriented x3, extra-ocular muscles intact, sensation grossly intact.  HEENT: Normocephalic, atraumatic  Skin: Warm and dry, no rashes. Cardiac: Regular rate and rhythm, no murmurs rubs or gallops, no lower extremity edema.  Respiratory: Clear to auscultation bilaterally. Not using accessory muscles, speaking in full sentences.   Impression and Recommendations:    HTN - Well controlled. Continue current regimen. Follow up in  6 months.    COPD with asthma -well.  No recent flares or exacerbations.  Continue with daily Stiolto and as needed albuterol.  She did get her flu shot today.  CKD 3 - due to recheck renal function.  Abnormal glucose/IFG - -hemoglobin A1c of 6.0 today which is in the prediabetes range.  Work on [employer], limiting carbs and  sweets and follow-up in 6 months.  REminded her she is due for her Tdap in May.  She can get that at her local pharmacy.

## 2018-08-08 NOTE — Assessment & Plan Note (Signed)
Axial discogenic back pain now resolved with Flexeril, prednisone, rehab exercises, heating pad. She now only needs a bit of Tylenol, return as needed for this.

## 2018-08-09 ENCOUNTER — Other Ambulatory Visit: Payer: Self-pay | Admitting: Family Medicine

## 2018-08-09 LAB — HEMOGLOBIN A1C
EAG (MMOL/L): 6.8 (calc)
HEMOGLOBIN A1C: 5.9 %{Hb} — AB (ref <5.7)
MEAN PLASMA GLUCOSE: 123 (calc)

## 2018-08-09 LAB — BASIC METABOLIC PANEL WITH GFR
BUN/Creatinine Ratio: 8 (calc) (ref 6–22)
BUN: 9 mg/dL (ref 7–25)
CO2: 27 mmol/L (ref 20–32)
CREATININE: 1.1 mg/dL — AB (ref 0.60–0.88)
Calcium: 10.2 mg/dL (ref 8.6–10.4)
Chloride: 102 mmol/L (ref 98–110)
GFR, EST NON AFRICAN AMERICAN: 46 mL/min/{1.73_m2} — AB (ref >=60)
GFR, Est African American: 54 mL/min/{1.73_m2} — ABNORMAL LOW (ref >=60)
Glucose, Bld: 88 mg/dL (ref 65–99)
Potassium: 4.5 mmol/L (ref 3.5–5.3)
SODIUM: 138 mmol/L (ref 135–146)

## 2018-08-09 NOTE — Progress Notes (Signed)
Subjective:   Erika Lynch is a 85 y.o. female who presents for Medicare Annual (Subsequent) preventive examination.  Review of Systems:  No ROS.  Medicare Wellness Visit. Additional risk factors are reflected in the social history.  Cardiac Risk Factors include: hypertension;dyslipidemia;advanced age (>49men, >66 women) Sleep patterns: Getting on average of 5 hours of sleep a night. [place] up 1 time a night to go to the bathroom. Wakes up feeling rested Home Safety/Smoke Alarms: Feels safe in home. Smoke alarms in place.  Living environmentLives alon in apartment. Shower has grab bars and no steps in the home;  Seat Belt Safety/Bike Helmet: Wears seat belt.   Female:   Pap-  Aged out unless necessary     Mammo-  Aged out per patient     Dexa scan-   utd     CCS- utd       Objective:     Vitals: There were no vitals taken for this visit.  There is no height or weight on file to calculate BMI.  Advanced Directives 08/10/2018 11/23/2013  Does Patient Have a Medical Advance Directive? Yes Patient has advance directive, copy not in chart  Type of Advance Directive [place];Living will [place];Living will  Does patient want to make changes to medical advance directive? No - Patient declined -  Copy of [place] in Chart? Yes - validated most recent copy scanned in chart (See row information) Copy requested from family    Tobacco Social History   Tobacco Use  Smoking Status Former Smoker  Smokeless Tobacco Never Used     Counseling given: Not Answered   Clinical Intake:  Pre-visit preparation completed: Yes  Pain : No/denies pain     Nutritional Risks: None Diabetes: No  How often do you need to have someone help you when you read instructions, pamphlets, or other written materials from your doctor or pharmacy?: 1 - Never What is the last grade level you completed in school?: 7  Interpreter Needed?:  No  Information entered by :: Orlie Dakin, LPN  Past Medical History:  Diagnosis Date  . Dyskinesia of esophagus   . Hypertension   . Kidney stone   . Uterine cancer [facility])    Past Surgical History:  Procedure Laterality Date  . ABDOMINAL HYSTERECTOMY     Uterine cancer  . BACK SURGERY     Family History  Problem Relation Age of Onset  . Hypertension Father   . Hyperlipidemia Father   . Heart attack Father   . Stroke Mother   . Heart attack Brother   . Heart attack Brother   . Colon cancer Sister   . Breast cancer Other    Social History   Socioeconomic History  . Marital status: Widowed    Spouse name: Not on file  . Number of children: 4  . Years of education: 7th  . Highest education level: 7th grade  Occupational History  . Occupation: Retired.     Employer: RETIRED    Comment: [employer]  Social Needs  . Financial resource strain: Not hard at all  . Food insecurity:    Worry: Never true    Inability: Never true  . Transportation needs:    Medical: No    Non-medical: No  Tobacco Use  . Smoking status: Former Research scientist (life sciences)  . Smokeless tobacco: Never Used  Substance and Sexual Activity  . Alcohol use: No  . Drug use: No  .  Sexual activity: Not Currently  Lifestyle  . Physical activity:    Days per week: 7 days    Minutes per session: 60 min  . Stress: Not at all  Relationships  . Social connections:    Talks on phone: More than three times a week    Gets together: Once a week    Attends religious service: Never    Active member of club or organization: No    Attends meetings of clubs or organizations: Never    Relationship status: Widowed  Other Topics Concern  . Not on file  Social History Narrative   3 living children: [name], Danny.  Daily caffeine use. Regular exercise walks daily for 1 hour at a time.    Outpatient Encounter Medications as of 08/10/2018  Medication Sig  . albuterol (PROVENTIL HFA;VENTOLIN HFA) 108 (90 Base) MCG/ACT  inhaler Inhale 2 puffs into the lungs every 6 (six) hours as needed for wheezing or shortness of breath.  Marland Kitchen amLODipine (NORVASC) 5 MG tablet Take 1 tablet (5 mg total) by mouth daily.  . fluticasone (FLONASE) 50 MCG/ACT nasal spray USE 1 SPRAY INTO BOTH NOSTRILS DAILY  . metoprolol tartrate (LOPRESSOR) 50 MG tablet Take 1 tablet (50 mg total) by mouth 2 (two) times daily.  Marland Kitchen omeprazole (PRILOSEC) 40 MG capsule Take 1 capsule (40 mg total) by mouth daily.  . Tiotropium Bromide-Olodaterol (STIOLTO RESPIMAT) 2.5-2.5 MCG/ACT AERS Inhale 2 puffs into the lungs daily.  . vitamin B-12 (CYANOCOBALAMIN) 50 MCG tablet Take 50 mcg by mouth daily.  . [DISCONTINUED] meclizine (ANTIVERT) 25 MG tablet Take 1 tablet (25 mg total) by mouth daily as needed for dizziness. (Patient not taking: Reported on 08/10/2018)   No facility-administered encounter medications on file as of 08/10/2018.     Activities of Daily Living In your present state of health, do you have any difficulty performing the following activities: 08/10/2018  Hearing? Y  Comment right ear. Needs to get checked again  Vision? N  Difficulty concentrating or making decisions? N  Walking or climbing stairs? N  Dressing or bathing? N  Doing errands, shopping? N  Preparing Food and eating ? N  Using the Toilet? N  In the past six months, have you accidently leaked urine? N  Do you have problems with loss of bowel control? N  Managing your Medications? N  Managing your Finances? N  Housekeeping or managing your Housekeeping? N  Some recent data might be hidden    Patient Care Team: Hali Marry, MD as PCP - General    Assessment:   This is a routine wellness examination for [facility].Physical assessment deferred to PCP.   Exercise Activities and Dietary recommendations Current Exercise Habits: Home exercise routine, Type of exercise: walking, Time (Minutes): 60, Frequency (Times/Week): 7, Weekly Exercise (Minutes/Week): 420, Intensity:  Mild, Exercise limited by: respiratory conditions(s) Diet  Eats healthy, vegetables, protein and drinks milk or will put in her oatmeal Breakfast: oatmeal, applesauce Lunch: [place], chicken and green beans Dinner:  Meat and vegetables     Goals    . DIET - REDUCE SODIUM INTAKE    . DIET - REDUCE SUGAR INTAKE     Decrease sugar and salt intake and watch her diet better       Fall Risk Fall Risk  08/10/2018 02/11/2017 12/17/2016 11/28/2015 08/17/2013  Falls in the past year? 0 No No No No  Follow up Falls prevention discussed - - - -   Is the patient's home  free of loose throw rugs in walkways, pet beds, electrical cords, etc?   yes      Grab bars in the bathroom? yes      Handrails on the stairs?   no      Adequate lighting?   yes   Depression Screen PHQ 2/9 Scores 08/10/2018 08/08/2018 01/31/2018 03/25/2017  PHQ - 2 Score 0 0 0 0     Cognitive Function     6CIT Screen 08/10/2018 02/11/2017  What Year? 0 points 0 points  What month? 0 points 0 points  What time? 0 points 0 points  Count back from 20 2 points 0 points  Months in reverse 0 points 2 points  Repeat phrase 0 points 6 points  Total Score 2 8    Immunization History  Administered Date(s) Administered  . Influenza Split 05/04/2012  . Influenza Whole 05/17/2008, 05/27/2009, 04/17/2010  . Influenza, High Dose Seasonal PF 04/15/2016, 03/25/2017, 08/08/2018  . Influenza-Unspecified 04/03/2013, 04/20/2015  . Pneumococcal Conjugate-13 05/13/2015  . Pneumococcal Polysaccharide-23 12/04/2008  . Td 12/04/2008  . Zoster 05/07/2010    Screening Tests Health Maintenance  Topic Date Due  . TETANUS/TDAP  12/05/2018  . INFLUENZA VACCINE  Completed  . DEXA SCAN  Completed  . PNA vac Low Risk Adult  Completed       Plan:    Please schedule your next medicare wellness visit with me in 1 yr.  Ms. [place] , Thank you for taking time to come for your Medicare Wellness Visit. I appreciate your ongoing commitment to your health  goals. Please review the following plan we discussed and let me know if I can assist you in the future.  Continue doing brain stimulating activities (puzzles, reading, adult coloring books, staying active) to keep memory sharp.  c  These are the goals we discussed: Goals    . DIET - REDUCE SODIUM INTAKE    . DIET - REDUCE SUGAR INTAKE     Decrease sugar and salt intake and watch her diet better       This is a list of the screening recommended for you and due dates:  Health Maintenance  Topic Date Due  . Tetanus Vaccine  12/05/2018  . Flu Shot  Completed  . DEXA scan (bone density measurement)  Completed  . Pneumonia vaccines  Completed      I have personally reviewed and noted the following in the patient's chart:   . Medical and social history . Use of alcohol, tobacco or illicit drugs  . Current medications and supplements . Functional ability and status . Nutritional status . Physical activity . Advanced directives . List of other physicians . Hospitalizations, surgeries, and ER visits in previous 12 months . Vitals . Screenings to include cognitive, depression, and falls . Referrals and appointments  In addition, I have reviewed and discussed with patient certain preventive protocols, quality metrics, and best practice recommendations. A written personalized care plan for preventive services as well as general preventive health recommendations were provided to patient.     Joanne Chars, LPN  04/08/7590

## 2018-08-10 ENCOUNTER — Ambulatory Visit (INDEPENDENT_AMBULATORY_CARE_PROVIDER_SITE_OTHER): Payer: Medicare HMO | Admitting: *Deleted

## 2018-08-10 VITALS — BP 131/58 | HR 72 | Ht 63.0 in | Wt 130.0 lb

## 2018-08-10 DIAGNOSIS — Z0000 Encounter for general adult medical examination without abnormal findings: Secondary | ICD-10-CM

## 2018-08-10 NOTE — Patient Instructions (Signed)
Please schedule your next medicare wellness visit with me in 1 yr.  Erika Lynch , Thank you for taking time to come for your Medicare Wellness Visit. I appreciate your ongoing commitment to your health goals. Please review the following plan we discussed and let me know if I can assist you in the future.  Continue doing brain stimulating activities (puzzles, reading, Erika Lynch coloring books, staying active) to keep memory sharp.   These are the goals we discussed: Goals    . DIET - REDUCE SODIUM INTAKE    . DIET - REDUCE SUGAR INTAKE     Decrease sugar and salt intake and watch her diet better     Erika Lynch, Erika Lynch, Erika Lynch  Erika lLynch carbohydrate l m?t ph??ng php theo di l??ng carbohydrate m quy? vi? ?n. ?n carbohydrate theo cch t? nhin lm t?ng l??ng ???ng (glucose) trong mu. Tnh l??ng carbohydrate qu v? ?n gip duy tr l??ng ???ng huy?t trong gi?i h?n bnh th??ng, qua ? gip qu v? ki?m sot b?nh ti?u ???ng (b?nh ti?u ???ng) c?a mnh. Bi?t ???c l??ng carbohydrate m qu v? c th? ?n m?t cch an ton trong m?i b?a ?n c vai tr quan tr?ng. L??ng carbohydrate ny khc nhau ? m?i Erika. Chuyn gia v? dinh d??ng v ch? ?? ?n (chuyn gia dinh d??ng c gi?y php hnh ngh?) c th? gip qu v? l?p k? ho?ch cho b?a ?n v tnh l??ng carbohydrate qu v? nn ?n vo m?i b?a ?n chnh v b?a ?n nh?. Carbohydrate co? trong cc lo?i th?c ph?m sau ?y:  Ng? c?c, nh? bnh m v ha?t ng? c?c.  ??u kh v cc s?n ph?m ??u nnh.  Erika Lynch c tinh b?t nh? khoai ty, ??u H Lan v ng.  Tri cy v n??c tri cy.  S?a v s?a chua.  K?o v ?? ?n nhe?, ch?ng h?n nh? bnh ng?t, bnh quy, k?o, khoai ty chin v n??c ng?t. Ti tnh l??ng carbohydrate nh? th? no? C hai cch tnh lLynch carbohydrate trong th?c ?n. Quy? vi? c th? s? d?ng m?t trong hai ph??ng php ho?c k?t h?p c? hai. ??c "Thng tin dinh dLynch" trn  th?c ph?m ?ng gi B?ng "Thng tin dinh d??ng" c trn nhn c?a h?u h?t t?t c? ca?c loa?i th?c ph?m va? ?? u?ng ?o?ng go?i t?i Hoa K?, bao g?m:  Kh?u ph?n ?n.  Thng tin v? cc ch?t dinh d??ng trong m?i kh?u ph?n, g?m c? s? gam (g) carbohydrate trong m?i kh?u ph?n. ?? s? d?ng "Thng tin dinh d??ng":  Quy?t ??nh s? kh?u ph?n qu v? s? ?n.  Nhn s? kh?u ph?n v??i l??ng carbohydrate trong kh?u ph?n ?.  K?t qu? l t?ng l??ng carbohydrate m qu v? s? ?n. Ti?m hi?u v? kh?u ph?n ?n tiu chu?n c?a cc th?c ph?m khc Khi ?n th?c ?n c carbohydrate khng ?ng gi ho?c khng co? "Thng tin dinh dLynch" trn nhn, quy? vi? c?n ?o cc kh?u ph?n ?? Erika l??ng carbohydrate:  ?o l??ng th?c ?n qu v? s? ?n b?ng cn th?c ph?m ho?c c?c ?ong, n?u c?n.  Quy?t ??nh s? kh?u ph?n theo kch th??c tiu chu?n qu v? s? ?n.  Nhn s? kh?u ph?n ny v?i 15. H?u h?t cc th?c ph?m giu carbohydrate ??u ch?a kho?ng 15 g carbohydrate trong m?i kh?u ph?n. ? V d?, n?u quy? vi? ?n 8 Aox? (170 g) du ty, quy? vi? s? ph?i ?n 2  kh?u ph?n v 30 g carbohydrate (2 kh?u ph?n x 15 g = 30 g).  ??i v?i nh??ng th?c ?n tr?n nhi?u lo?i th?c ph?m nh? sp v th?t h?m, quy? vi? s? ph?i tnh lLynch carbohydrate trong m?i lo?i th?c ph?m c trong ?o?Marland Kitchen D??i ?y l danh sch kh?u ph?n ?n tiu chu?n c?a nh?ng th?c ph?m ph? bi?n giu carbohydrate. M?i kh?u ph?n trong s? ny ??u c kho?ng 15 g carbohydrate:   bnh hamburger ho?c  bnh muffin cu?a Anh.   Aox? (15 mL) xi-r.   Aox? (14 g) th?ch.  1 lt bnh m.  1 bnh m ng (bnh tortilla) c kch th??c 6 inch.  3 Aox? (85 g) c?m ho?c m ?ng.  4 Aox? (113 g) ??u kh n?u chn.  4 Aox? (113 g) rau c nhi?u tinh b?t, ch?ng h?n nh? ??u H Lan, ng, ho?c khoai ty.  4 Aox? (113 g) ng? c?c nng.  4 Aox? (113 g) khoai ty nghi?n ho?c  c? khoai ty l?n n??ng.  4 Aox? (113 g) tri cy ?ng h?p ho?c ?ng l?nh.  4 Aox? (120 mL) n??c p tri cy.  4-6 bnh quy  gin.  6 mi?ng th?t g.  6 Aox? (170 g) ng? c?c kh khng ???ng.  6 Aox? (170 g) s?a chua khng bo nguyn ch?t ho?c s?a chua co? thm ch?t lm ng?t nhn t?o.  8 Aox? (240 mL) s?a.  8 Aox? (170 g) tri cy t??i ho?c m?t mi?ng tri cy nh?.  24 Aox? (680 g) b?ng ng. V d? v? cch tnh carbohydrate B?a ?n m?u  3 Aox? (85 g) l???n g.  6 Aox? (170 g) g?o l?c.  4 Aox? (113 g) ng.  8 Aox? (240 mL) s?a.  8 Aox? (170 g) du ty ph?t kem t??i khng ???ng. Erika lLynch carbohydrate 1. Xc ??nh cc th?c ph?m ch?a carbohydrate: ? C?m. ? Ng. ? S?a. ? Du ty. 2. Tnh s? kh?u ph?n ?n v?i m?i th?c ph?m qu v? c: ? 2 kh?u ph?n c?m. ? 1 kh?u ph?n ng. ? 1 kh?u ph?n s?a. ? 1 kh?u ph?n du ty. 3. Nhn m?i s? kh?u ph?n ?o? v??i 15 g: ? 2 kh?u ph?n c?m x 15 g = 30 g. ? 1 kh?u ph?n ng x 15 g = 15 g. ? 1 kh?u ph?n s?a x 15 g = 15 g. ? 1 kh?u ph?n du ty x 15 g = 15 g. 4. C?ng t?t c? cc s? ? ?? tm ra t?ng s? gam carbohydrate ?n va?o: ? 30 g + 15 g + 15 g + 15 g = 75 g t?ng l??ng carbohydrate. Tm t?t  Erika lLynch carbohydrate l m?t ph??ng php theo di l??ng carbohydrate m quy? vi? ?n.  ?n carbohydrate theo cch t? nhin lm t?ng l??ng ???ng (glucose) trong mu.  Tnh l??ng carbohydrate qu v? ?n gip duy tr l??ng ???ng huy?t trong gi?i h?n bnh th??ng, qua ? gip qu v? ki?m sot b?nh ti?u ???ng c?a mnh.  Chuyn gia v? dinh d??ng v ch? ?? ?n (chuyn gia dinh d??ng c gi?y php hnh ngh?) c th? gip qu v? l?p k? ho?ch cho b?a ?n v tnh l??ng carbohydrate qu v? nn ?n vo m?i b?a ?n chnh v b?a ?n nh?Tera Mater tin ny khng nh?m m?c ?ch thay th? cho l?i khuyn m chuyn gia ch?m [place] s?c kh?e ni v?i qu v?. Hy b?o ??m qu v? ph?i th?o lu?n b?t k? v?n ??  g m qu v? c v?i chuyn gia ch?m [place] s?c kh?e c?a qu v?. Document Released: 11/11/2015 Document Revised: 02/18/2017 Document Reviewed: 01/01/2016 Elsevier Interactive Patient Education  2019  Elsevier Inc.  Prediabetes Prediabetes is the condition of having a blood sugar (blood glucose) level that is higher than it should be, but not high enough for you to be diagnosed with type 2 diabetes. Having prediabetes puts you at risk for developing type 2 diabetes (type 2 diabetes Lynch). Prediabetes may be called impaired glucose tolerance or impaired fasting glucose. Prediabetes usually does not cause symptoms. Your health care provider can diagnose this condition with blood tests. You may be tested for prediabetes if you are overweight and if you have at least one other risk factor for prediabetes. What is blood glucose, and how is it measured? Blood glucose refers to the amount of glucose in your bloodstream. Glucose comes from eating foods that contain sugars and starches (carbohydrates), which the body breaks down into glucose. Your blood glucose level may be measured in mg/dL (milligrams per deciliter) or mmol/L (millimoles per liter). Your blood glucose may be checked with one or more of the following blood tests:  A fasting blood glucose (FBG) test. You will not be allowed to eat (you will fast) for 8 hours or longer before a blood sample is taken. ? A normal range for FBG is 70-100 mg/dl (3.9-5.6 mmol/L).  An A1c (hemoglobin A1c) blood test. This test provides information about blood glucose control over the previous 2?110months.  An oral glucose tolerance test (OGTT). This test measures your blood glucose at two times: ? After fasting. This is your baseline level. ? Two hours after you drink a beverage that contains glucose. You may be diagnosed with prediabetes:  If your FBG is 100?125 mg/dL (5.6-6.9 mmol/L).  If your A1c level is 5.7?6.4%.  If your OGTT result is 140?199 mg/dL (7.8-11 mmol/L). These blood tests may be repeated to confirm your diagnosis. How can this condition affect me? The pancreas produces a hormone (insulin) that helps to move glucose from the  bloodstream into cells. When cells in the body do not respond properly to insulin that the body makes (insulin resistance), excess glucose builds up in the blood instead of going into cells. As a result, high blood glucose (hyperglycemia) can develop, which can cause many complications. Hyperglycemia is a symptom of prediabetes. Having high blood glucose for a long time is dangerous. Too much glucose in your blood can damage your nerves and blood vessels. Long-term damage can lead to complications from diabetes, which may include:  Heart disease.  Stroke.  Blindness.  Kidney disease.  Depression.  Poor circulation in the feet and legs, which could lead to surgical removal (amputation) in severe cases. What can increase my risk? Risk factors for prediabetes include:  Having a family member with type 2 diabetes.  Being overweight or obese.  Being older than age 33.  Being of American [place], African-American, Hispanic/Latino, or Asian/Pacific Islander descent.  Having an inactive (sedentary) lifestyle.  Having a history of heart disease.  History of gestational diabetes or polycystic ovary syndrome (PCOS), in women.  Having low levels of good cholesterol (HDL-C) or high levels of blood fats (triglycerides).  Having high blood pressure. What actions can I take to prevent diabetes?      Be physically active. ? Do moderate-intensity physical activity for 30 or more minutes on 5 or more days of the week, or as much as told by your  health care provider. This could be brisk walking, biking, or water aerobics. ? Ask your health care provider what activities are safe for you. A mix of physical activities may be best, such as walking, swimming, cycling, and strength training.  Lose weight as told by your health care provider. ? Losing 5-7% of your body weight can reverse insulin resistance. ? Your health care provider can determine how much weight loss is best for you and can help  you lose weight safely.  Follow a healthy meal plan. This includes eating lean proteins, complex carbohydrates, fresh fruits and vegetables, low-fat dairy products, and healthy fats. ? Follow instructions from your health care provider about eating or drinking restrictions. ? Make an appointment to see a diet and nutrition specialist (registered dietitian) to help you create a healthy eating plan that is right for you.  Do not smoke or use any tobacco products, such as cigarettes, chewing tobacco, and e-cigarettes. If you need help quitting, ask your health care provider.  Take over-the-counter and prescription medicines as told by your health care provider. You may be prescribed medicines that help lower the risk of type 2 diabetes.  Keep all follow-up visits as told by your health care provider. This is important. Summary  Prediabetes is the condition of having a blood sugar (blood glucose) level that is higher than it should be, but not high enough for you to be diagnosed with type 2 diabetes.  Having prediabetes puts you at risk for developing type 2 diabetes (type 2 diabetes Lynch).  To help prevent type 2 diabetes, make lifestyle changes such as being physically active and eating a healthy diet. Lose weight as told by your health care provider. This information is not intended to replace advice given to you by your health care provider. Make sure you discuss any questions you have with your health care provider. Document Released: 11/11/2015 Document Revised: 03/09/2017 Document Reviewed: 09/10/2015 Elsevier Interactive Patient Education  2019 [place] DASH stands for "Dietary Approaches to Stop Hypertension." The DASH eating plan is a healthy eating plan that has been shown to reduce high blood pressure (hypertension). It may also reduce your risk for type 2 diabetes, heart disease, and stroke. The DASH eating plan may also help with weight loss. What are  tips for following this plan?  General guidelines  Avoid eating more than 2,300 mg (milligrams) of salt (sodium) a day. If you have hypertension, you may need to reduce your sodium intake to 1,500 mg a day.  Limit alcohol intake to no more than 1 drink a day for nonpregnant women and 2 drinks a day for men. One drink equals 12 oz of beer, 5 oz of wine, or 1 oz of hard liquor.  Work with your health care provider to maintain a healthy body weight or to lose weight. Ask what an ideal weight is for you.  Get at least 30 minutes of exercise that causes your heart to beat faster (aerobic exercise) most days of the week. Activities may include walking, swimming, or biking.  Work with your health care provider or diet and nutrition specialist (dietitian) to adjust your eating plan to your individual calorie needs. Reading food labels   Check food labels for the amount of sodium per serving. Choose foods with less than 5 percent of the Daily Value of sodium. Generally, foods with less than 300 mg of sodium per serving fit into this eating plan.  To find whole grains,  look for the word "whole" as the first word in the ingredient list. Shopping  Buy products labeled as "low-sodium" or "no salt added."  Buy fresh foods. Avoid canned foods and premade or frozen meals. Cooking  Avoid adding salt when cooking. Use salt-free seasonings or herbs instead of table salt or sea salt. Check with your health care provider or pharmacist before using salt substitutes.  Do not fry foods. Cook foods using healthy methods such as baking, boiling, grilling, and broiling instead.  Cook with heart-healthy oils, such as olive, canola, soybean, or sunflower oil. Meal planning  Eat a balanced diet that includes: ? 5 or more servings of fruits and vegetables each day. At each meal, try to fill half of your plate with fruits and vegetables. ? Up to 6-8 servings of whole grains each day. ? Less than 6 oz of lean  meat, poultry, or fish each day. A 3-oz serving of meat is about the same size as a deck of cards. One egg equals 1 oz. ? 2 servings of low-fat dairy each day. ? A serving of nuts, seeds, or beans 5 times each week. ? Heart-healthy fats. Healthy fats called Omega-3 fatty acids are found in foods such as flaxseeds and coldwater fish, like sardines, salmon, and mackerel.  Limit how much you eat of the following: ? Canned or prepackaged foods. ? Food that is high in trans fat, such as fried foods. ? Food that is high in saturated fat, such as fatty meat. ? Sweets, desserts, sugary drinks, and other foods with added sugar. ? Full-fat dairy products.  Do not salt foods before eating.  Try to eat at least 2 vegetarian meals each week.  Eat more home-cooked food and less restaurant, buffet, and fast food.  When eating at a restaurant, ask that your food be prepared with less salt or no salt, if possible. What foods are recommended? The items listed may not be a complete list. Talk with your dietitian about what dietary choices are best for you. Grains Whole-grain or whole-wheat bread. Whole-grain or whole-wheat pasta. Brown rice. [place]ena Morrow. Bulgur. Whole-grain and low-sodium cereals. Pita bread. Low-fat, low-sodium crackers. Whole-wheat flour tortillas. Vegetables Fresh or frozen vegetables (raw, steamed, roasted, or grilled). Low-sodium or reduced-sodium tomato and vegetable juice. Low-sodium or reduced-sodium tomato sauce and tomato paste. Low-sodium or reduced-sodium canned vegetables. Fruits All fresh, dried, or frozen fruit. Canned fruit in natural juice (without added sugar). Meat and other protein foods Skinless chicken or [place]. Ground chicken or [place]. Pork with fat trimmed off. Fish and seafood. Egg whites. Dried beans, peas, or lentils. Unsalted nuts, nut butters, and seeds. Unsalted canned beans. Lean cuts of beef with fat trimmed off. Low-sodium, lean deli  meat. Dairy Low-fat (1%) or fat-free (skim) milk. Fat-free, low-fat, or reduced-fat cheeses. Nonfat, low-sodium ricotta or cottage cheese. Low-fat or nonfat yogurt. Low-fat, low-sodium cheese. Fats and oils Soft margarine without trans fats. Vegetable oil. Low-fat, reduced-fat, or light mayonnaise and salad dressings (reduced-sodium). Canola, safflower, olive, soybean, and sunflower oils. Avocado. Seasoning and other foods Herbs. Spices. Seasoning mixes without salt. Unsalted popcorn and pretzels. Fat-free sweets. What foods are not recommended? The items listed may not be a complete list. Talk with your dietitian about what dietary choices are best for you. Grains Baked goods made with fat, such as croissants, muffins, or some breads. Dry pasta or rice meal packs. Vegetables Creamed or fried vegetables. Vegetables in a cheese sauce. Regular canned vegetables (not low-sodium or reduced-sodium). Regular  canned tomato sauce and paste (not low-sodium or reduced-sodium). Regular tomato and vegetable juice (not low-sodium or reduced-sodium). Angie Fava. Olives. Fruits Canned fruit in a light or heavy syrup. Fried fruit. Fruit in cream or butter sauce. Meat and other protein foods Fatty cuts of meat. Ribs. Fried meat. Berniece Salines. Sausage. Bologna and other processed lunch meats. Salami. Fatback. Hotdogs. Bratwurst. Salted nuts and seeds. Canned beans with added salt. Canned or smoked fish. Whole eggs or egg yolks. Chicken or [place] with skin. Dairy Whole or 2% milk, cream, and half-and-half. Whole or full-fat cream cheese. Whole-fat or sweetened yogurt. Full-fat cheese. Nondairy creamers. Whipped toppings. Processed cheese and cheese spreads. Fats and oils Butter. Stick margarine. Lard. Shortening. Ghee. Bacon fat. Tropical oils, such as coconut, palm kernel, or palm oil. Seasoning and other foods Salted popcorn and pretzels. Onion salt, garlic salt, seasoned salt, table salt, and sea salt. Worcestershire  sauce. Tartar sauce. Barbecue sauce. Teriyaki sauce. Soy sauce, including reduced-sodium. Steak sauce. Canned and packaged gravies. Fish sauce. Oyster sauce. Cocktail sauce. Horseradish that you find on the shelf. Ketchup. Mustard. Meat flavorings and tenderizers. Bouillon cubes. Hot sauce and Tabasco sauce. Premade or packaged marinades. Premade or packaged taco seasonings. Relishes. Regular salad dressings. Where to find more information:  National Heart, Lung, and [place]: https://wilson-eaton.com/  American Heart Association: www.heart.org Summary  The DASH eating plan is a healthy eating plan that has been shown to reduce high blood pressure (hypertension). It may also reduce your risk for type 2 diabetes, heart disease, and stroke.  With the DASH eating plan, you should limit salt (sodium) intake to 2,300 mg a day. If you have hypertension, you may need to reduce your sodium intake to 1,500 mg a day.  When on the DASH eating plan, aim to eat more fresh fruits and vegetables, whole grains, lean proteins, low-fat dairy, and heart-healthy fats.  Work with your health care provider or diet and nutrition specialist (dietitian) to adjust your eating plan to your individual calorie needs. This information is not intended to replace advice given to you by your health care provider. Make sure you discuss any questions you have with your health care provider. Document Released: 07/09/2011 Document Revised: 07/13/2016 Document Reviewed: 07/13/2016 Elsevier Interactive Patient Education  2019 [employer].

## 2018-09-06 ENCOUNTER — Other Ambulatory Visit: Payer: Self-pay | Admitting: Family Medicine

## 2018-09-06 DIAGNOSIS — I10 Essential (primary) hypertension: Secondary | ICD-10-CM

## 2018-12-17 ENCOUNTER — Other Ambulatory Visit: Payer: Self-pay | Admitting: Family Medicine

## 2019-01-11 ENCOUNTER — Other Ambulatory Visit: Payer: Self-pay | Admitting: Family Medicine

## 2019-01-11 DIAGNOSIS — I10 Essential (primary) hypertension: Secondary | ICD-10-CM

## 2019-01-25 ENCOUNTER — Other Ambulatory Visit: Payer: Self-pay | Admitting: Family Medicine

## 2019-01-25 DIAGNOSIS — J449 Chronic obstructive pulmonary disease, unspecified: Secondary | ICD-10-CM

## 2019-02-01 ENCOUNTER — Telehealth: Payer: Self-pay

## 2019-02-01 DIAGNOSIS — I10 Essential (primary) hypertension: Secondary | ICD-10-CM

## 2019-02-01 DIAGNOSIS — N1831 Chronic kidney disease, stage 3a: Secondary | ICD-10-CM

## 2019-02-01 DIAGNOSIS — R7301 Impaired fasting glucose: Secondary | ICD-10-CM

## 2019-02-01 DIAGNOSIS — E785 Hyperlipidemia, unspecified: Secondary | ICD-10-CM

## 2019-02-01 NOTE — Telephone Encounter (Signed)
Order signed.

## 2019-02-01 NOTE — Telephone Encounter (Signed)
Anderson has an appointment of Monday. She would like to go ahead and do her labs. Pended orders.

## 2019-02-01 NOTE — Telephone Encounter (Signed)
Pt advised.

## 2019-02-06 ENCOUNTER — Ambulatory Visit: Payer: Medicare HMO | Admitting: Family Medicine

## 2019-02-06 DIAGNOSIS — R7301 Impaired fasting glucose: Secondary | ICD-10-CM | POA: Diagnosis not present

## 2019-02-06 DIAGNOSIS — I10 Essential (primary) hypertension: Secondary | ICD-10-CM | POA: Diagnosis not present

## 2019-02-06 DIAGNOSIS — N183 Chronic kidney disease, stage 3 (moderate): Secondary | ICD-10-CM | POA: Diagnosis not present

## 2019-02-06 DIAGNOSIS — E785 Hyperlipidemia, unspecified: Secondary | ICD-10-CM | POA: Diagnosis not present

## 2019-02-06 NOTE — Progress Notes (Deleted)
Established Patient Office Visit  Subjective:  Patient ID: Erika Lynch, female    DOB: 30-May-1933  Age: 85 y.o. MRN: [id number]  CC: No chief complaint on file.   HPI Erika Lynch presents for   Hypertension- Pt denies chest pain, SOB, dizziness, or heart palpitations.  Taking meds as directed w/o problems.  Denies medication side effects.    Impaired fasting glucose-no increased thirst or urination. No symptoms consistent with hypoglycemia.  F/U CKD 3 -   Past Medical History:  Diagnosis Date  . Dyskinesia of esophagus   . Hypertension   . Kidney stone   . Uterine cancer [facility])     Past Surgical History:  Procedure Laterality Date  . ABDOMINAL HYSTERECTOMY     Uterine cancer  . BACK SURGERY      Family History  Problem Relation Age of Onset  . Hypertension Father   . Hyperlipidemia Father   . Heart attack Father   . Stroke Mother   . Heart attack Brother   . Heart attack Brother   . Colon cancer Sister   . Breast cancer Other     Social History   Socioeconomic History  . Marital status: Widowed    Spouse name: Not on file  . Number of children: 4  . Years of education: 7th  . Highest education level: 7th grade  Occupational History  . Occupation: Retired.     Employer: RETIRED    Comment: [employer]  Social Needs  . Financial resource strain: Not hard at all  . Food insecurity    Worry: Never true    Inability: Never true  . Transportation needs    Medical: No    Non-medical: No  Tobacco Use  . Smoking status: Former Research scientist (life sciences)  . Smokeless tobacco: Never Used  Substance and Sexual Activity  . Alcohol use: No  . Drug use: No  . Sexual activity: Not Currently  Lifestyle  . Physical activity    Days per week: 7 days    Minutes per session: 60 min  . Stress: Not at all  Relationships  . Social connections    Talks on phone: More than three times a week    Gets together: Once a week    Attends religious service: Never    Active  member of club or organization: No    Attends meetings of clubs or organizations: Never    Relationship status: Widowed  . Intimate partner violence    Fear of current or ex partner: No    Emotionally abused: No    Physically abused: No    Forced sexual activity: No  Other Topics Concern  . Not on file  Social History Narrative   3 living children: Erika Lynch, Erika Lynch.  Daily caffeine use. Regular exercise walks daily for 1 hour at a time.    Outpatient Medications Prior to Visit  Medication Sig Dispense Refill  . albuterol (PROVENTIL HFA;VENTOLIN HFA) 108 (90 Base) MCG/ACT inhaler Inhale 2 puffs into the lungs every 6 (six) hours as needed for wheezing or shortness of breath. 3 Inhaler 4  . amLODipine (NORVASC) 5 MG tablet TAKE 1 TABLET EVERY DAY 90 tablet 1  . fluticasone (FLONASE) 50 MCG/ACT nasal spray USE 1 SPRAY INTO BOTH NOSTRILS DAILY 32 g 3  . meclizine (ANTIVERT) 25 MG tablet TAKE 1 TABLET EVERY DAY AS NEEDED FOR  DIZZINESS 90 tablet 1  . metoprolol tartrate (LOPRESSOR) 50 MG tablet TAKE 1 TABLET TWICE  DAILY 180 tablet 1  . omeprazole (PRILOSEC) 40 MG capsule Take 1 capsule (40 mg total) by mouth daily. 90 capsule 3  . Tiotropium Bromide-Olodaterol (STIOLTO RESPIMAT) 2.5-2.5 MCG/ACT AERS Inhale 2 puffs into the lungs daily. 12 g 3  . vitamin B-12 (CYANOCOBALAMIN) 50 MCG tablet Take 50 mcg by mouth daily.     No facility-administered medications prior to visit.     Allergies  Allergen Reactions  . Aspirin   . Cisapride   . Imipramine Hcl   . Mometasone Swelling    Throat swelling   . Penicillins Rash  . Sulfonamide Derivatives Rash    ROS Review of Systems    Objective:    Physical Exam  There were no vitals taken for this visit. Wt Readings from Last 3 Encounters:  08/10/18 130 lb (59 kg)  08/08/18 130 lb (59 kg)  08/08/18 130 lb (59 kg)     Health Maintenance Due  Topic Date Due  . TETANUS/TDAP  12/05/2018    There are no preventive care  reminders to display for this patient.  Lab Results  Component Value Date   TSH 3.19 08/13/2016   Lab Results  Component Value Date   WBC 10.5 08/19/2017   HGB 15.1 08/19/2017   HCT 43.4 08/19/2017   MCV 91.4 08/19/2017   PLT 304 08/19/2017   Lab Results  Component Value Date   NA 138 08/08/2018   K 4.5 08/08/2018   CO2 27 08/08/2018   GLUCOSE 88 08/08/2018   BUN 9 08/08/2018   CREATININE 1.10 (H) 08/08/2018   BILITOT 0.5 02/01/2018   ALKPHOS 51 05/21/2016   AST 17 02/01/2018   ALT 13 02/01/2018   PROT 6.9 02/01/2018   ALBUMIN 4.3 05/21/2016   CALCIUM 10.2 08/08/2018   Lab Results  Component Value Date   CHOL 249 (H) 02/01/2018   Lab Results  Component Value Date   HDL 47 (L) 02/01/2018   Lab Results  Component Value Date   LDLCALC 166 (H) 02/01/2018   Lab Results  Component Value Date   TRIG 202 (H) 02/01/2018   Lab Results  Component Value Date   CHOLHDL 5.3 (H) 02/01/2018   Lab Results  Component Value Date   HGBA1C 5.9 (H) 08/08/2018      Assessment & Plan:   Problem List Items Addressed This Visit      Cardiovascular and Mediastinum   HYPERTENSION, BENIGN - Primary     Endocrine   IFG (impaired fasting glucose)     Genitourinary   CKD stage G3a/A1, GFR 45-59 and albumin creatinine ratio <30 mg/g (HCC)      No orders of the defined types were placed in this encounter.   Follow-up: No follow-ups on file.    Beatrice Lecher, MD

## 2019-02-07 LAB — MICROALBUMIN / CREATININE URINE RATIO
Creatinine, Urine: 92 mg/dL (ref 20–275)
Microalb Creat Ratio: 9 mcg/mg creat (ref <30)
Microalb, Ur: 0.8 mg/dL

## 2019-02-07 LAB — COMPLETE METABOLIC PANEL WITH GFR
AG Ratio: 1.8 (calc) (ref 1.0–2.5)
ALT: 7 U/L (ref 6–29)
AST: 13 U/L (ref 10–35)
Albumin: 4.3 g/dL (ref 3.6–5.1)
Alkaline phosphatase (APISO): 45 U/L (ref 37–153)
BUN/Creatinine Ratio: 10 (calc) (ref 6–22)
BUN: 11 mg/dL (ref 7–25)
CO2: 27 mmol/L (ref 20–32)
Calcium: 10 mg/dL (ref 8.6–10.4)
Chloride: 106 mmol/L (ref 98–110)
Creat: 1.12 mg/dL — ABNORMAL HIGH (ref 0.60–0.88)
GFR, Est African American: 52 mL/min/{1.73_m2} — ABNORMAL LOW (ref >=60)
GFR, Est Non African American: 45 mL/min/{1.73_m2} — ABNORMAL LOW (ref >=60)
Globulin: 2.4 g/dL (calc) (ref 1.9–3.7)
Glucose, Bld: 93 mg/dL (ref 65–99)
Potassium: 4.5 mmol/L (ref 3.5–5.3)
Sodium: 140 mmol/L (ref 135–146)
Total Bilirubin: 0.5 mg/dL (ref 0.2–1.2)
Total Protein: 6.7 g/dL (ref 6.1–8.1)

## 2019-02-07 LAB — LIPID PANEL W/REFLEX DIRECT LDL
Cholesterol: 224 mg/dL — ABNORMAL HIGH (ref <200)
HDL: 40 mg/dL — ABNORMAL LOW (ref >=50)
LDL Cholesterol (Calc): 149 mg/dL (calc) — ABNORMAL HIGH
Non-HDL Cholesterol (Calc): 184 mg/dL (calc) — ABNORMAL HIGH (ref <130)
Total CHOL/HDL Ratio: 5.6 (calc) — ABNORMAL HIGH (ref <5.0)
Triglycerides: 213 mg/dL — ABNORMAL HIGH (ref <150)

## 2019-02-07 LAB — HEMOGLOBIN A1C
Hgb A1c MFr Bld: 5.7 % of total Hgb — ABNORMAL HIGH (ref <5.7)
Mean Plasma Glucose: 117 (calc)
eAG (mmol/L): 6.5 (calc)

## 2019-02-09 ENCOUNTER — Other Ambulatory Visit: Payer: Self-pay

## 2019-02-09 ENCOUNTER — Ambulatory Visit (INDEPENDENT_AMBULATORY_CARE_PROVIDER_SITE_OTHER): Payer: Medicare HMO | Admitting: Family Medicine

## 2019-02-09 ENCOUNTER — Encounter: Payer: Self-pay | Admitting: Family Medicine

## 2019-02-09 VITALS — BP 120/58 | HR 72 | Ht 63.0 in | Wt 127.0 lb

## 2019-02-09 DIAGNOSIS — E785 Hyperlipidemia, unspecified: Secondary | ICD-10-CM

## 2019-02-09 DIAGNOSIS — I10 Essential (primary) hypertension: Secondary | ICD-10-CM

## 2019-02-09 DIAGNOSIS — J4520 Mild intermittent asthma, uncomplicated: Secondary | ICD-10-CM

## 2019-02-09 DIAGNOSIS — R7301 Impaired fasting glucose: Secondary | ICD-10-CM | POA: Diagnosis not present

## 2019-02-09 DIAGNOSIS — K2100 Gastro-esophageal reflux disease with esophagitis, without bleeding: Secondary | ICD-10-CM

## 2019-02-09 DIAGNOSIS — J449 Chronic obstructive pulmonary disease, unspecified: Secondary | ICD-10-CM | POA: Diagnosis not present

## 2019-02-09 MED ORDER — METOPROLOL TARTRATE 50 MG PO TABS: 50.0000 mg | ORAL_TABLET | Freq: Two times a day (BID) | ORAL | 1 refills | Status: DC

## 2019-02-09 MED ORDER — AMLODIPINE BESYLATE 5 MG PO TABS: 5.0000 mg | ORAL_TABLET | Freq: Every day | ORAL | 1 refills | Status: DC

## 2019-02-09 MED ORDER — ALBUTEROL SULFATE HFA 108 (90 BASE) MCG/ACT IN AERS: 2.0000 | INHALATION_SPRAY | Freq: Four times a day (QID) | RESPIRATORY_TRACT | 4 refills | Status: DC | PRN

## 2019-02-09 MED ORDER — LANSOPRAZOLE 15 MG PO CPDR: 15.0000 mg | DELAYED_RELEASE_CAPSULE | ORAL | 0 refills | Status: DC

## 2019-02-09 NOTE — Assessment & Plan Note (Signed)
Does have elevated lipids but she was able to bring them down slightly compared to last year so great work encouraged her to continue to work on [employer] and try to stay more active.

## 2019-02-09 NOTE — Assessment & Plan Note (Signed)
Well controlled. Continue current regimen. Follow up in  6 months.  

## 2019-02-09 NOTE — Assessment & Plan Note (Signed)
1C looks fantastic at 5.7.  Continue current regimen follow-up in 6 months.  She did a great job in bringing her A1c down as well as her lipids.

## 2019-02-09 NOTE — Assessment & Plan Note (Addendum)
Doing well at that point she can discontinue medication if not continues to have persistent symptoms and encouraged her to call us back and let us know.Still sleep on 3 pillows.  Will restart PPI for a couple of weeks.

## 2019-02-09 NOTE — Assessment & Plan Note (Signed)
Stable. No recent flares. Will refill medication.

## 2019-02-09 NOTE — Progress Notes (Signed)
Established Patient Office Visit  Subjective:  Patient ID: Erika Lynch, female    DOB: Aug 15, 1932  Age: 85 y.o. MRN: [id number]  CC:  Chief Complaint  Patient presents with  . Diabetes  . Gastroesophageal Reflux    pt reports that she felt like her throat was closing up when she drinks something cold this seems to help and she is able to belch    HPI Erika Lynch presents for   Hypertension- Pt denies chest pain, SOB, dizziness, or heart palpitations.  Taking meds as directed w/o problems.  Denies medication side effects.    Impaired fasting glucose-no increased thirst or urination. No symptoms consistent with hypoglycemia.  She says starting yesterdays started having some indigestions. Hx of GERD but hasn't had to take medication for it in years..  She does sleep on 3 pillows though.  She says it started yesterday after she drank a Sprite.  And just kept belching.  She says she still had a little bit of reflux this morning even.  She did not try taking any medication for it.   Past Medical History:  Diagnosis Date  . Dyskinesia of esophagus   . Hypertension   . Kidney stone   . Uterine cancer [facility])     Past Surgical History:  Procedure Laterality Date  . ABDOMINAL HYSTERECTOMY     Uterine cancer  . BACK SURGERY      Family History  Problem Relation Age of Onset  . Hypertension Father   . Hyperlipidemia Father   . Heart attack Father   . Stroke Mother   . Heart attack Brother   . Heart attack Brother   . Colon cancer Sister   . Breast cancer Other     Social History   Socioeconomic History  . Marital status: Widowed    Spouse name: Not on file  . Number of children: 4  . Years of education: 7th  . Highest education level: 7th grade  Occupational History  . Occupation: Retired.     Employer: RETIRED    Comment: [employer]  Social Needs  . Financial resource strain: Not hard at all  . Food insecurity    Worry: Never true    Inability: Never  true  . Transportation needs    Medical: No    Non-medical: No  Tobacco Use  . Smoking status: Former Research scientist (life sciences)  . Smokeless tobacco: Never Used  Substance and Sexual Activity  . Alcohol use: No  . Drug use: No  . Sexual activity: Not Currently  Lifestyle  . Physical activity    Days per week: 7 days    Minutes per session: 60 min  . Stress: Not at all  Relationships  . Social connections    Talks on phone: More than three times a week    Gets together: Once a week    Attends religious service: Never    Active member of club or organization: No    Attends meetings of clubs or organizations: Never    Relationship status: Widowed  . Intimate partner violence    Fear of current or ex partner: No    Emotionally abused: No    Physically abused: No    Forced sexual activity: No  Other Topics Concern  . Not on file  Social History Narrative   3 living children: [name], Danny.  Daily caffeine use. Regular exercise walks daily for 1 hour at a time.    Outpatient Medications Prior  to Visit  Medication Sig Dispense Refill  . fluticasone (FLONASE) 50 MCG/ACT nasal spray USE 1 SPRAY INTO BOTH NOSTRILS DAILY 32 g 3  . meclizine (ANTIVERT) 25 MG tablet TAKE 1 TABLET EVERY DAY AS NEEDED FOR  DIZZINESS 90 tablet 1  . omeprazole (PRILOSEC) 40 MG capsule Take 1 capsule (40 mg total) by mouth daily. 90 capsule 3  . Tiotropium Bromide-Olodaterol (STIOLTO RESPIMAT) 2.5-2.5 MCG/ACT AERS Inhale 2 puffs into the lungs daily. 12 g 3  . vitamin B-12 (CYANOCOBALAMIN) 50 MCG tablet Take 50 mcg by mouth daily.    Marland Kitchen albuterol (PROVENTIL HFA;VENTOLIN HFA) 108 (90 Base) MCG/ACT inhaler Inhale 2 puffs into the lungs every 6 (six) hours as needed for wheezing or shortness of breath. 3 Inhaler 4  . amLODipine (NORVASC) 5 MG tablet TAKE 1 TABLET EVERY DAY 90 tablet 1  . metoprolol tartrate (LOPRESSOR) 50 MG tablet TAKE 1 TABLET TWICE DAILY 180 tablet 1   No facility-administered medications prior to  visit.     Allergies  Allergen Reactions  . Sulfa Antibiotics Rash  . Aspirin   . Cisapride   . Imipramine Hcl   . Mometasone Swelling    Throat swelling   . Penicillins Rash  . Sulfonamide Derivatives Rash    ROS Review of Systems    Objective:    Physical Exam  Constitutional: She is oriented to person, place, and time. She appears well-developed and well-nourished.  HENT:  Head: Normocephalic and atraumatic.  Right Ear: External ear normal.  Left Ear: External ear normal.  Nose: Nose normal.  Mouth/Throat: Oropharynx is clear and moist.  Left TM and canal is clear.   Eyes: Pupils are equal, round, and reactive to light. Conjunctivae and EOM are normal.  Neck: Neck supple. No thyromegaly present.  Cardiovascular: Normal rate, regular rhythm and normal heart sounds.  Pulmonary/Chest: Effort normal and breath sounds normal. She has no wheezes.  Lymphadenopathy:    She has no cervical adenopathy.  Neurological: She is alert and oriented to person, place, and time.  Skin: Skin is warm and dry.  Psychiatric: She has a normal mood and affect.    BP (!) 120/58   Pulse 72   Ht 5\' 3"  (1.6 m)   Wt 127 lb (57.6 kg)   SpO2 98%   BMI 22.50 kg/m  Wt Readings from Last 3 Encounters:  02/09/19 127 lb (57.6 kg)  08/10/18 130 lb (59 kg)  08/08/18 130 lb (59 kg)     There are no preventive care reminders to display for this patient.  There are no preventive care reminders to display for this patient.  Lab Results  Component Value Date   TSH 3.19 08/13/2016   Lab Results  Component Value Date   WBC 10.5 08/19/2017   HGB 15.1 08/19/2017   HCT 43.4 08/19/2017   MCV 91.4 08/19/2017   PLT 304 08/19/2017   Lab Results  Component Value Date   NA 140 02/06/2019   K 4.5 02/06/2019   CO2 27 02/06/2019   GLUCOSE 93 02/06/2019   BUN 11 02/06/2019   CREATININE 1.12 (H) 02/06/2019   BILITOT 0.5 02/06/2019   ALKPHOS 51 05/21/2016   AST 13 02/06/2019   ALT 7  02/06/2019   PROT 6.7 02/06/2019   ALBUMIN 4.3 05/21/2016   CALCIUM 10.0 02/06/2019   Lab Results  Component Value Date   CHOL 224 (H) 02/06/2019   Lab Results  Component Value Date   HDL 40 (L) 02/06/2019  Lab Results  Component Value Date   LDLCALC 149 (H) 02/06/2019   Lab Results  Component Value Date   TRIG 213 (H) 02/06/2019   Lab Results  Component Value Date   CHOLHDL 5.6 (H) 02/06/2019   Lab Results  Component Value Date   HGBA1C 5.7 (H) 02/06/2019      Assessment & Plan:   Problem List Items Addressed This Visit      Cardiovascular and Mediastinum   HYPERTENSION, BENIGN    Well controlled. Continue current regimen. Follow up in  6 months.       Relevant Medications   amLODipine (NORVASC) 5 MG tablet   metoprolol tartrate (LOPRESSOR) 50 MG tablet     Respiratory   COPD with asthma (HCC)    Stable. No recent flares. Will refill medication.       Relevant Medications   albuterol (VENTOLIN HFA) 108 (90 Base) MCG/ACT inhaler     Digestive   GERD    Doing well at that point she can discontinue medication if not continues to have persistent symptoms and encouraged her to call us back and let us know.Still sleep on 3 pillows.  Will restart PPI for a couple of weeks.       Relevant Medications   lansoprazole (PREVACID) 15 MG capsule     Endocrine   IFG (impaired fasting glucose) - Primary    1C looks fantastic at 5.7.  Continue current regimen follow-up in 6 months.  She did a great job in bringing her A1c down as well as her lipids.        Other   Hyperlipidemia    Does have elevated lipids but she was able to bring them down slightly compared to last year so great work encouraged her to continue to work on healthy diet and try to stay more active.      Relevant Medications   amLODipine (NORVASC) 5 MG tablet   metoprolol tartrate (LOPRESSOR) 50 MG tablet    Other Visit Diagnoses    Mild intermittent asthma without complication        Relevant Medications   albuterol (VENTOLIN HFA) 108 (90 Base) MCG/ACT inhaler      Meds ordered this encounter  Medications  . albuterol (VENTOLIN HFA) 108 (90 Base) MCG/ACT inhaler    Sig: Inhale 2 puffs into the lungs every 6 (six) hours as needed for wheezing or shortness of breath.    Dispense:  18 g    Refill:  4  . amLODipine (NORVASC) 5 MG tablet    Sig: Take 1 tablet (5 mg total) by mouth daily.    Dispense:  90 tablet    Refill:  1  . metoprolol tartrate (LOPRESSOR) 50 MG tablet    Sig: Take 1 tablet (50 mg total) by mouth 2 (two) times daily.    Dispense:  180 tablet    Refill:  1  . lansoprazole (PREVACID) 15 MG capsule    Sig: Take 1 capsule (15 mg total) by mouth every morning.    Dispense:  30 capsule    Refill:  0    Follow-up: Return in about 6 months (around 08/12/2019) for COPD and medicaiton .    Beatrice Lecher, MD

## 2019-03-01 ENCOUNTER — Other Ambulatory Visit: Payer: Self-pay | Admitting: Family Medicine

## 2019-03-01 DIAGNOSIS — J449 Chronic obstructive pulmonary disease, unspecified: Secondary | ICD-10-CM

## 2019-03-07 DIAGNOSIS — H524 Presbyopia: Secondary | ICD-10-CM | POA: Diagnosis not present

## 2019-03-07 DIAGNOSIS — H2513 Age-related nuclear cataract, bilateral: Secondary | ICD-10-CM | POA: Diagnosis not present

## 2019-04-01 ENCOUNTER — Other Ambulatory Visit: Payer: Self-pay | Admitting: Family Medicine

## 2019-04-26 DIAGNOSIS — H25812 Combined forms of age-related cataract, left eye: Secondary | ICD-10-CM | POA: Diagnosis not present

## 2019-04-26 DIAGNOSIS — H2512 Age-related nuclear cataract, left eye: Secondary | ICD-10-CM | POA: Diagnosis not present

## 2019-05-15 ENCOUNTER — Other Ambulatory Visit: Payer: Self-pay | Admitting: Family Medicine

## 2019-05-16 MED ORDER — MECLIZINE HCL 25 MG PO TABS: 25.0000 mg | ORAL_TABLET | Freq: Every day | ORAL | 1 refills | Status: DC | PRN

## 2019-05-16 NOTE — Addendum Note (Signed)
Addended by: Narda Rutherford on: 05/16/2019 01:21 PM   Modules accepted: Orders

## 2019-05-26 ENCOUNTER — Other Ambulatory Visit: Payer: Self-pay | Admitting: Family Medicine

## 2019-05-26 DIAGNOSIS — J4520 Mild intermittent asthma, uncomplicated: Secondary | ICD-10-CM

## 2019-06-19 ENCOUNTER — Other Ambulatory Visit: Payer: Self-pay | Admitting: Family Medicine

## 2019-06-19 DIAGNOSIS — I10 Essential (primary) hypertension: Secondary | ICD-10-CM

## 2019-07-05 DIAGNOSIS — H25811 Combined forms of age-related cataract, right eye: Secondary | ICD-10-CM | POA: Diagnosis not present

## 2019-07-05 DIAGNOSIS — H2511 Age-related nuclear cataract, right eye: Secondary | ICD-10-CM | POA: Diagnosis not present

## 2019-08-07 NOTE — Progress Notes (Signed)
Subjective:   Erika Lynch is a 86 y.o. female who presents for Medicare Annual (Subsequent) preventive examination.  Review of Systems:  No ROS.  Medicare Wellness Virtual Visit.  Visual/audio telehealth visit, UTA vital signs.   See social history for additional risk factors.    Cardiac Risk Factors include: advanced age (>26men, >32 women);hypertension Sleep patterns: Getting about 3 hours of sleep a night. Wakes up 1 time a night to go to void. Wakes up and feels rested. Home Safety/Smoke Alarms: Feels safe in home. Smoke alarms in place.  Living environment;Lives alone in apartment and no stairs in or around apartment. Shower is a step over tub combo and grab bars in place. Seat Belt Safety/Bike Helmet: Wears seat belt.   Female:   Pap-  Aged out     Mammo- Aged out       Dexa scan- ordered     CCS- Aged out     Objective:     Vitals: There were no vitals taken for this visit.  There is no height or weight on file to calculate BMI.  Advanced Directives 08/14/2019 08/10/2018 11/23/2013  Does Patient Have a Medical Advance Directive? Yes Yes Patient has advance directive, copy not in chart  Type of Advance Directive [place];Living will [place];Living will [place];Living will  Does patient want to make changes to medical advance directive? No - Patient declined No - Patient declined -  Copy of [place] in Chart? Yes - validated most recent copy scanned in chart (See row information) Yes - validated most recent copy scanned in chart (See row information) Copy requested from family    Tobacco Social History   Tobacco Use  Smoking Status Former Smoker  Smokeless Tobacco Never Used     Counseling given: Not Answered   Clinical Intake:  Pre-visit preparation completed: Yes  Pain : No/denies pain     Nutritional Risks: None Diabetes: No  How often do you need to have someone help  you when you read instructions, pamphlets, or other written materials from your doctor or pharmacy?: 1 - Never What is the last grade level you completed in school?: 7  Interpreter Needed?: No  Information entered by :: Orlie Dakin, LPN  Past Medical History:  Diagnosis Date  . Cataract   . Dyskinesia of esophagus   . Hypertension   . Kidney stone   . Uterine cancer [facility])    Past Surgical History:  Procedure Laterality Date  . ABDOMINAL HYSTERECTOMY     Uterine cancer  . BACK SURGERY     Family History  Problem Relation Age of Onset  . Hypertension Father   . Hyperlipidemia Father   . Heart attack Father   . Stroke Mother   . Heart attack Brother   . Heart attack Brother   . Colon cancer Sister   . Breast cancer Other    Social History   Socioeconomic History  . Marital status: Widowed    Spouse name: Not on file  . Number of children: 4  . Years of education: 7th  . Highest education level: 7th grade  Occupational History  . Occupation: Retired.     Employer: RETIRED    Comment: [employer]  Tobacco Use  . Smoking status: Former Research scientist (life sciences)  . Smokeless tobacco: Never Used  Substance and Sexual Activity  . Alcohol use: No  . Drug use: No  . Sexual activity: Not  Currently  Other Topics Concern  . Not on file  Social History Narrative   3 living children: [name], Danny.  Daily caffeine use. Regular exercise walks daily for 1 hour at a time.   Social Determinants of Health   Financial Resource Strain:   . Difficulty of Paying Living Expenses: Not on file  Food Insecurity:   . Worried About Charity fundraiser in the Last Year: Not on file  . Ran Out of Food in the Last Year: Not on file  Transportation Needs:   . Lack of Transportation (Medical): Not on file  . Lack of Transportation (Non-Medical): Not on file  Physical Activity:   . Days of Exercise per Week: Not on file  . Minutes of Exercise per Session: Not on file  Stress:   . Feeling of  Stress : Not on file  Social Connections:   . Frequency of Communication with Friends and Family: Not on file  . Frequency of Social Gatherings with Friends and Family: Not on file  . Attends Religious Services: Not on file  . Active Member of Clubs or Organizations: Not on file  . Attends Archivist Meetings: Not on file  . Marital Status: Not on file    Outpatient Encounter Medications as of 08/14/2019  Medication Sig  . albuterol (VENTOLIN HFA) 108 (90 Base) MCG/ACT inhaler INHALE 2 PUFFS INTO THE LUNGS EVERY 6 (SIX) HOURS AS NEEDED FOR WHEEZING OR SHORTNESS OF BREATH.  Marland Kitchen amLODipine (NORVASC) 5 MG tablet TAKE 1 TABLET (5 MG TOTAL) BY MOUTH DAILY.  . fluticasone (FLONASE) 50 MCG/ACT nasal spray USE 1 SPRAY INTO BOTH NOSTRILS DAILY  . lansoprazole (PREVACID) 15 MG capsule Take 1 capsule (15 mg total) by mouth every morning.  . meclizine (ANTIVERT) 25 MG tablet Take 1 tablet (25 mg total) by mouth daily as needed for dizziness.  . metoprolol tartrate (LOPRESSOR) 50 MG tablet TAKE 1 TABLET (50 MG TOTAL) BY MOUTH 2 (TWO) TIMES DAILY.  Marland Kitchen omeprazole (PRILOSEC) 40 MG capsule TAKE 1 CAPSULE EVERY DAY  . STIOLTO RESPIMAT 2.5-2.5 MCG/ACT AERS INHALE 2 PUFFS INTO THE LUNGS DAILY.  . vitamin B-12 (CYANOCOBALAMIN) 50 MCG tablet Take 50 mcg by mouth daily.   No facility-administered encounter medications on file as of 08/14/2019.    Activities of Daily Living In your present state of health, do you have any difficulty performing the following activities: 08/14/2019  Hearing? Y  Comment left ear hearing loss  Vision? N  Difficulty concentrating or making decisions? N  Walking or climbing stairs? N  Dressing or bathing? N  Doing errands, shopping? N  Preparing Food and eating ? N  Using the Toilet? N  In the past six months, have you accidently leaked urine? N  Do you have problems with loss of bowel control? N  Managing your Medications? N  Managing your Finances? N  Housekeeping or  managing your Housekeeping? N  Some recent data might be hidden    Patient Care Team: Hali Marry, MD as PCP - General    Assessment:   This is a routine wellness examination for [facility].Physical assessment deferred to PCP.   Exercise Activities and Dietary recommendations Current Exercise Habits: Home exercise routine, Time (Minutes): 50, Frequency (Times/Week): 7, Weekly Exercise (Minutes/Week): 350, Intensity: Mild, Exercise limited by: None identified(chair exercises) Diet  Eats a healthy diet of vegetables, fruits and chicken. Breakfast: oatmeal Lunch: green beans and other vegetables Dinner: chicken and vegetables  Drinks 16 ounce of water daily 2 cups of coffee daily  Goals    . DIET - REDUCE SODIUM INTAKE    . DIET - REDUCE SUGAR INTAKE     Decrease sugar and salt intake and watch her diet better       Fall Risk Fall Risk  08/14/2019 02/09/2019 08/10/2018 02/11/2017 12/17/2016  Falls in the past year? 0 0 0 No No  Number falls in past yr: 0 0 - - -  Injury with Fall? 0 0 - - -  Follow up Falls prevention discussed - Falls prevention discussed - -   Is the patient's home free of loose throw rugs in walkways, pet beds, electrical cords, etc?   yes      Grab bars in the bathroom? yes      Handrails on the stairs?   no      Adequate lighting?   yes   Depression Screen PHQ 2/9 Scores 08/14/2019 02/09/2019 08/10/2018 08/08/2018  PHQ - 2 Score 0 0 0 0     Cognitive Function     6CIT Screen 08/14/2019 08/10/2018 02/11/2017  What Year? 0 points 0 points 0 points  What month? 0 points 0 points 0 points  What time? 0 points 0 points 0 points  Count back from 20 2 points 2 points 0 points  Months in reverse 2 points 0 points 2 points  Repeat phrase 0 points 0 points 6 points  Total Score 4 2 8     Immunization History  Administered Date(s) Administered  . Influenza Split 05/04/2012  . Influenza Whole 05/17/2008, 05/27/2009, 04/17/2010  . Influenza, High Dose Seasonal  PF 04/15/2016, 03/25/2017, 08/08/2018  . Influenza-Unspecified 04/03/2013, 04/20/2015  . Pneumococcal Conjugate-13 05/13/2015  . Pneumococcal Polysaccharide-23 12/04/2008  . Td 12/04/2008  . Zoster 05/07/2010    Screening Tests Health Maintenance  Topic Date Due  . TETANUS/TDAP  12/05/2018  . INFLUENZA VACCINE  03/04/2019  . DEXA SCAN  Completed  . PNA vac Low Risk Adult  Completed        Plan:    Please schedule your next medicare wellness visit with me in 1 yr.  Ms. Almon , Thank you for taking time to come for your Medicare Wellness Visit. I appreciate your ongoing commitment to your health goals. Please review the following plan we discussed and let me know if I can assist you in the future.  Continue doing brain stimulating activities (puzzles, reading, adult coloring books, staying active) to keep memory sharp.    These are the goals we discussed: Goals    . DIET - REDUCE SODIUM INTAKE    . DIET - REDUCE SUGAR INTAKE     Decrease sugar and salt intake and watch her diet better       This is a list of the screening recommended for you and due dates:  Health Maintenance  Topic Date Due  . Tetanus Vaccine  12/05/2018  . Flu Shot  03/04/2019  . DEXA scan (bone density measurement)  Completed  . Pneumonia vaccines  Completed     I have personally reviewed and noted the following in the patient's chart:   . Medical and social history . Use of alcohol, tobacco or illicit drugs  . Current medications and supplements . Functional ability and status . Nutritional status . Physical activity . Advanced directives . List of other physicians . Hospitalizations, surgeries, and ER visits in previous 12 months . Vitals . Screenings to include cognitive,  depression, and falls . Referrals and appointments  In addition, I have reviewed and discussed with patient certain preventive protocols, quality metrics, and best practice recommendations. A written personalized  care plan for preventive services as well as general preventive health recommendations were provided to patient.     Joanne Chars, LPN  [id number]

## 2019-08-14 ENCOUNTER — Encounter: Payer: Self-pay | Admitting: Family Medicine

## 2019-08-14 ENCOUNTER — Ambulatory Visit (INDEPENDENT_AMBULATORY_CARE_PROVIDER_SITE_OTHER): Payer: Medicare HMO | Admitting: *Deleted

## 2019-08-14 ENCOUNTER — Ambulatory Visit (INDEPENDENT_AMBULATORY_CARE_PROVIDER_SITE_OTHER): Payer: Medicare HMO | Admitting: Family Medicine

## 2019-08-14 ENCOUNTER — Other Ambulatory Visit: Payer: Self-pay

## 2019-08-14 VITALS — BP 115/51 | HR 75 | Ht 63.0 in | Wt 127.0 lb

## 2019-08-14 DIAGNOSIS — H9202 Otalgia, left ear: Secondary | ICD-10-CM

## 2019-08-14 DIAGNOSIS — I10 Essential (primary) hypertension: Secondary | ICD-10-CM | POA: Diagnosis not present

## 2019-08-14 DIAGNOSIS — Z23 Encounter for immunization: Secondary | ICD-10-CM

## 2019-08-14 DIAGNOSIS — Z780 Asymptomatic menopausal state: Secondary | ICD-10-CM

## 2019-08-14 DIAGNOSIS — R7301 Impaired fasting glucose: Secondary | ICD-10-CM | POA: Diagnosis not present

## 2019-08-14 DIAGNOSIS — Z13820 Encounter for screening for osteoporosis: Secondary | ICD-10-CM

## 2019-08-14 DIAGNOSIS — Z0000 Encounter for general adult medical examination without abnormal findings: Secondary | ICD-10-CM | POA: Diagnosis not present

## 2019-08-14 DIAGNOSIS — R42 Dizziness and giddiness: Secondary | ICD-10-CM | POA: Diagnosis not present

## 2019-08-14 DIAGNOSIS — H8110 Benign paroxysmal vertigo, unspecified ear: Secondary | ICD-10-CM | POA: Insufficient documentation

## 2019-08-14 LAB — POCT GLYCOSYLATED HEMOGLOBIN (HGB A1C): Hemoglobin A1C: 5.5 % (ref 4.0–5.6)

## 2019-08-14 NOTE — Progress Notes (Signed)
Pt is doing well on her current regimen.   She does c/o L ear pain and dizziness that occurs every now and then. She has been seen by ENT and does the home exercises to help with this however, she stated that it hasn't helped her much.

## 2019-08-14 NOTE — Assessment & Plan Note (Signed)
Well controlled. Continue current regimen. Follow up in  6 mo  

## 2019-08-14 NOTE — Assessment & Plan Note (Signed)
Likely BBPV. Plan to refer to vestibular rehab. She wants to call me back later this week before placing referral to see if she feels better.

## 2019-08-14 NOTE — Patient Instructions (Signed)
Please schedule your next medicare wellness visit with me in 1 yr.  Erika Lynch , Thank you for taking time to come for your Medicare Wellness Visit. I appreciate your ongoing commitment to your health goals. Please review the following plan we discussed and let me know if I can assist you in the future.  Continue doing brain stimulating activities (puzzles, reading, adult coloring books, staying active) to keep memory sharp.   These are the goals we discussed: Goals    . DIET - REDUCE SODIUM INTAKE    . DIET - REDUCE SUGAR INTAKE     Decrease sugar and salt intake and watch her diet better

## 2019-08-14 NOTE — Progress Notes (Signed)
Established Patient Office Visit  Subjective:  Patient ID: Erika Lynch, female    DOB: Mar 30, 1933  Age: 86 y.o. MRN: [id number]  CC:  Chief Complaint  Patient presents with  . COPD    HPI Erika Lynch presents for   Hypertension- Pt denies chest pain, SOB, dizziness, or heart palpitations.  Taking meds as directed w/o problems.  Denies medication side effects.    Impaired fasting glucose-no increased thirst or urination. No symptoms consistent with hypoglycemia.  Also complains of vertigo/dizziness she will often wake up with it to the point where she has to get up and take a meclizine and then lay back down.  It will make her "sick".  She says it is been coming and going since when she saw Dr. Claria Dice about a year and a half ago she also has some intermittent left ear pain with pain particularly right behind the left ear.  She says that when she had her eye surgeries for cataracts it seemed to get worse at her initial surgery was in September and the second was in December.  While she was using the steroid eyedrops she actually felt like that aggravated her symptoms as well but just finished them up.  She says she actually feels a little bit better this week.   Past Medical History:  Diagnosis Date  . Cataract   . Dyskinesia of esophagus   . Hypertension   . Kidney stone   . Uterine cancer [facility])     Past Surgical History:  Procedure Laterality Date  . ABDOMINAL HYSTERECTOMY     Uterine cancer  . BACK SURGERY      Family History  Problem Relation Age of Onset  . Hypertension Father   . Hyperlipidemia Father   . Heart attack Father   . Stroke Mother   . Heart attack Brother   . Heart attack Brother   . Colon cancer Sister   . Breast cancer Other     Social History   Socioeconomic History  . Marital status: Widowed    Spouse name: Not on file  . Number of children: 4  . Years of education: 7th  . Highest education level: 7th grade  Occupational History   . Occupation: Retired.     Employer: RETIRED    Comment: [employer]  Tobacco Use  . Smoking status: Former Research scientist (life sciences)  . Smokeless tobacco: Never Used  Substance and Sexual Activity  . Alcohol use: No  . Drug use: No  . Sexual activity: Not Currently  Other Topics Concern  . Not on file  Social History Narrative   3 living children: [name], Danny.  Daily caffeine use. Regular exercise walks daily for 1 hour at a time.   Social Determinants of Health   Financial Resource Strain:   . Difficulty of Paying Living Expenses: Not on file  Food Insecurity:   . Worried About Charity fundraiser in the Last Year: Not on file  . Ran Out of Food in the Last Year: Not on file  Transportation Needs:   . Lack of Transportation (Medical): Not on file  . Lack of Transportation (Non-Medical): Not on file  Physical Activity:   . Days of Exercise per Week: Not on file  . Minutes of Exercise per Session: Not on file  Stress:   . Feeling of Stress : Not on file  Social Connections:   . Frequency of Communication with Friends and Family: Not on file  .  Frequency of Social Gatherings with Friends and Family: Not on file  . Attends Religious Services: Not on file  . Active Member of Clubs or Organizations: Not on file  . Attends Archivist Meetings: Not on file  . Marital Status: Not on file  Intimate Partner Violence:   . Fear of Current or Ex-Partner: Not on file  . Emotionally Abused: Not on file  . Physically Abused: Not on file  . Sexually Abused: Not on file    Outpatient Medications Prior to Visit  Medication Sig Dispense Refill  . albuterol (VENTOLIN HFA) 108 (90 Base) MCG/ACT inhaler INHALE 2 PUFFS INTO THE LUNGS EVERY 6 (SIX) HOURS AS NEEDED FOR WHEEZING OR SHORTNESS OF BREATH. 18 g 4  . amLODipine (NORVASC) 5 MG tablet TAKE 1 TABLET (5 MG TOTAL) BY MOUTH DAILY. 90 tablet 1  . fluticasone (FLONASE) 50 MCG/ACT nasal spray USE 1 SPRAY INTO BOTH NOSTRILS DAILY 32 g 3  .  lansoprazole (PREVACID) 15 MG capsule Take 1 capsule (15 mg total) by mouth every morning. 30 capsule 0  . meclizine (ANTIVERT) 25 MG tablet Take 1 tablet (25 mg total) by mouth daily as needed for dizziness. 90 tablet 1  . metoprolol tartrate (LOPRESSOR) 50 MG tablet TAKE 1 TABLET (50 MG TOTAL) BY MOUTH 2 (TWO) TIMES DAILY. 180 tablet 1  . omeprazole (PRILOSEC) 40 MG capsule TAKE 1 CAPSULE EVERY DAY 90 capsule 3  . STIOLTO RESPIMAT 2.5-2.5 MCG/ACT AERS INHALE 2 PUFFS INTO THE LUNGS DAILY. 12 g 3  . vitamin B-12 (CYANOCOBALAMIN) 50 MCG tablet Take 50 mcg by mouth daily.     No facility-administered medications prior to visit.    Allergies  Allergen Reactions  . Sulfa Antibiotics Rash  . Aspirin   . Cisapride   . Imipramine Hcl   . Mometasone Swelling    Throat swelling   . Penicillins Rash  . Sulfonamide Derivatives Rash    ROS Review of Systems    Objective:    Physical Exam  BP (!) 115/51   Pulse 75   Ht 5\' 3"  (1.6 m)   Wt 127 lb (57.6 kg)   SpO2 97%   BMI 22.50 kg/m  Wt Readings from Last 3 Encounters:  08/14/19 127 lb (57.6 kg)  08/14/19 127 lb (57.6 kg)  02/09/19 127 lb (57.6 kg)     Health Maintenance Due  Topic Date Due  . TETANUS/TDAP  12/05/2018    There are no preventive care reminders to display for this patient.  Lab Results  Component Value Date   TSH 3.19 08/13/2016   Lab Results  Component Value Date   WBC 10.5 08/19/2017   HGB 15.1 08/19/2017   HCT 43.4 08/19/2017   MCV 91.4 08/19/2017   PLT 304 08/19/2017   Lab Results  Component Value Date   NA 140 02/06/2019   K 4.5 02/06/2019   CO2 27 02/06/2019   GLUCOSE 93 02/06/2019   BUN 11 02/06/2019   CREATININE 1.12 (H) 02/06/2019   BILITOT 0.5 02/06/2019   ALKPHOS 51 05/21/2016   AST 13 02/06/2019   ALT 7 02/06/2019   PROT 6.7 02/06/2019   ALBUMIN 4.3 05/21/2016   CALCIUM 10.0 02/06/2019   Lab Results  Component Value Date   CHOL 224 (H) 02/06/2019   Lab Results   Component Value Date   HDL 40 (L) 02/06/2019   Lab Results  Component Value Date   LDLCALC 149 (H) 02/06/2019   Lab Results  Component Value Date   TRIG 213 (H) 02/06/2019   Lab Results  Component Value Date   CHOLHDL 5.6 (H) 02/06/2019   Lab Results  Component Value Date   HGBA1C 5.7 (H) 02/06/2019      Assessment & Plan:   Problem List Items Addressed This Visit      Cardiovascular and Mediastinum   HYPERTENSION, BENIGN - Primary    Well controlled. Continue current regimen. Follow up in  6  mo        Endocrine   IFG (impaired fasting glucose)    Well controlled. Continue current regimen. Follow up in  6 mot        Other   Vertigo    Likely BBPV. Plan to refer to vestibular rehab. She wants to call me back later this week before placing referral to see if she feels better.       Other Visit Diagnoses    Need for immunization against influenza       Relevant Orders   Flu Vaccine QUAD High Dose(Fluad) (Completed)   Left ear pain         Left ear pain-exam is normal.  Just a little bit tender behind the ear but this is not new.  We can always refer back to ENT if needed.  Last time she consulted with Dr. Ernesto Rutherford was about a year and a half ago.  No orders of the defined types were placed in this encounter.     Follow-up: Return in about 6 months (around 02/11/2020) for Hypertension.    Beatrice Lecher, MD

## 2019-08-14 NOTE — Assessment & Plan Note (Signed)
Well controlled. Continue current regimen. Follow up in  6 mot

## 2019-08-14 NOTE — Addendum Note (Signed)
Addended by: Teddy Spike on: 08/14/2019 03:50 PM   Modules accepted: Orders

## 2019-08-15 DIAGNOSIS — Z961 Presence of intraocular lens: Secondary | ICD-10-CM | POA: Diagnosis not present

## 2019-08-16 ENCOUNTER — Ambulatory Visit: Payer: Medicare HMO | Admitting: Family Medicine

## 2019-09-20 ENCOUNTER — Other Ambulatory Visit: Payer: Self-pay | Admitting: Family Medicine

## 2019-10-23 ENCOUNTER — Ambulatory Visit (INDEPENDENT_AMBULATORY_CARE_PROVIDER_SITE_OTHER): Payer: Medicare HMO | Admitting: Physician Assistant

## 2019-10-23 ENCOUNTER — Encounter: Payer: Self-pay | Admitting: Physician Assistant

## 2019-10-23 ENCOUNTER — Ambulatory Visit: Payer: Medicare HMO | Admitting: Family Medicine

## 2019-10-23 ENCOUNTER — Other Ambulatory Visit: Payer: Self-pay

## 2019-10-23 VITALS — BP 150/78 | HR 76 | Ht 63.0 in | Wt 129.0 lb

## 2019-10-23 DIAGNOSIS — N3001 Acute cystitis with hematuria: Secondary | ICD-10-CM

## 2019-10-23 DIAGNOSIS — I10 Essential (primary) hypertension: Secondary | ICD-10-CM | POA: Diagnosis not present

## 2019-10-23 DIAGNOSIS — I493 Ventricular premature depolarization: Secondary | ICD-10-CM | POA: Diagnosis not present

## 2019-10-23 DIAGNOSIS — E559 Vitamin D deficiency, unspecified: Secondary | ICD-10-CM | POA: Diagnosis not present

## 2019-10-23 DIAGNOSIS — H539 Unspecified visual disturbance: Secondary | ICD-10-CM

## 2019-10-23 DIAGNOSIS — R030 Elevated blood-pressure reading, without diagnosis of hypertension: Secondary | ICD-10-CM | POA: Diagnosis not present

## 2019-10-23 DIAGNOSIS — R350 Frequency of micturition: Secondary | ICD-10-CM | POA: Diagnosis not present

## 2019-10-23 LAB — POCT URINALYSIS DIP (CLINITEK)
Bilirubin, UA: NEGATIVE
Glucose, UA: NEGATIVE mg/dL
Ketones, POC UA: NEGATIVE mg/dL
Nitrite, UA: NEGATIVE
POC PROTEIN,UA: NEGATIVE
Spec Grav, UA: 1.01 (ref 1.010–1.025)
Urobilinogen, UA: 0.2 E.U./dL
pH, UA: 7 (ref 5.0–8.0)

## 2019-10-23 MED ORDER — NITROFURANTOIN MONOHYD MACRO 100 MG PO CAPS: 100.0000 mg | ORAL_CAPSULE | Freq: Two times a day (BID) | ORAL | 0 refills | Status: DC

## 2019-10-23 NOTE — Patient Instructions (Signed)
Urinary Tract Infection, Adult A urinary tract infection (UTI) is an infection of any part of the urinary tract. The urinary tract includes:  The kidneys.  The ureters.  The bladder.  The urethra. These organs make, store, and get rid of pee (urine) in the body. What are the causes? This is caused by germs (bacteria) in your genital area. These germs grow and cause swelling (inflammation) of your urinary tract. What increases the risk? You are more likely to develop this condition if:  You have a small, thin tube (catheter) to drain pee.  You cannot control when you pee or poop (incontinence).  You are female, and: ? You use these methods to prevent pregnancy:  A medicine that kills sperm (spermicide).  A device that blocks sperm (diaphragm). ? You have low levels of a female hormone (estrogen). ? You are pregnant.  You have genes that add to your risk.  You are sexually active.  You take antibiotic medicines.  You have trouble peeing because of: ? A prostate that is bigger than normal, if you are female. ? A blockage in the part of your body that drains pee from the bladder (urethra). ? A kidney stone. ? A nerve condition that affects your bladder (neurogenic bladder). ? Not getting enough to drink. ? Not peeing often enough.  You have other conditions, such as: ? Diabetes. ? A weak disease-fighting system (immune system). ? Sickle cell disease. ? Gout. ? Injury of the spine. What are the signs or symptoms? Symptoms of this condition include:  Needing to pee right away (urgently).  Peeing often.  Peeing small amounts often.  Pain or burning when peeing.  Blood in the pee.  Pee that smells bad or not like normal.  Trouble peeing.  Pee that is cloudy.  Fluid coming from the vagina, if you are female.  Pain in the belly or lower back. Other symptoms include:  Throwing up (vomiting).  No urge to eat.  Feeling mixed up (confused).  Being tired  and grouchy (irritable).  A fever.  Watery poop (diarrhea). How is this treated? This condition may be treated with:  Antibiotic medicine.  Other medicines.  Drinking enough water. Follow these instructions at home:  Medicines  Take over-the-counter and prescription medicines only as told by your doctor.  If you were prescribed an antibiotic medicine, take it as told by your doctor. Do not stop taking it even if you start to feel better. General instructions  Make sure you: ? Pee until your bladder is empty. ? Do not hold pee for a long time. ? Empty your bladder after sex. ? Wipe from front to back after pooping if you are a female. Use each tissue one time when you wipe.  Drink enough fluid to keep your pee pale yellow.  Keep all follow-up visits as told by your doctor. This is important. Contact a doctor if:  You do not get better after 1-2 days.  Your symptoms go away and then come back. Get help right away if:  You have very bad back pain.  You have very bad pain in your lower belly.  You have a fever.  You are sick to your stomach (nauseous).  You are throwing up. Summary  A urinary tract infection (UTI) is an infection of any part of the urinary tract.  This condition is caused by germs in your genital area.  There are many risk factors for a UTI. These include having a small, thin   tube to drain pee and not being able to control when you pee or poop.  Treatment includes antibiotic medicines for germs.  Drink enough fluid to keep your pee pale yellow. This information is not intended to replace advice given to you by your health care provider. Make sure you discuss any questions you have with your health care provider. Document Revised: 07/07/2018 Document Reviewed: 01/27/2018 Elsevier Patient Education  2020 Elsevier Inc.  

## 2019-10-23 NOTE — Progress Notes (Signed)
Subjective:    Patient ID: Erika Lynch, female    DOB: 09/30/1932, 86 y.o.   MRN: [id number]  HPI  Patient is an 86 year old female with hypertension, CKD who presents to the clinic after elevated blood pressure reading this Sunday that scared her.  She also admits there is some vision blurriness and changes, her head was pounding and her heart felt like it was beating weird.  She denies any history of heart palpitations.  She denies any fever, chills, cough, sinus pressure, ear pain.  She does report some increase in urine frequency but no dysuria or flank pain.  She does have a history of UTIs and not really knowing that she has one.  She denies any chest pain, weakness, swelling, shortness of breath.  She does have some dizziness but that is not new and has been ongoing intermittently for a while.  Her headache does feel better today she feels like her heart is still "skipping beats". She is taking norvasc and metoprolol.   .. Active Ambulatory Problems    Diagnosis Date Noted  . Vitamin B12 deficiency 01/04/2008  . Hyperlipidemia 10/12/2007  . Essential hypertension 10/12/2007  . DYSKINESIA OF ESOPHAGUS 12/08/2007  . GERD 12/29/2007  . CKD stage G3a/A1, GFR 45-59 and albumin creatinine ratio <30 mg/g ([place]) 10/28/2007  . NECK PAIN 10/26/2007  . BACK PAIN, LUMBAR 12/09/2009  . PALPITATIONS 05/28/2008  . CAROTID BRUIT, LEFT 10/26/2007  . Cervical disc disease 06/13/2012  . Lumbar spinal stenosis 06/13/2012  . COPD with asthma ([place]) 09/11/2016  . History of esophageal stricture 12/17/2016  . Urine frequency 04/06/2017  . History of uterine cancer 04/06/2017  . Microscopic hematuria 04/06/2017  . DDD (degenerative disc disease), cervical 07/04/2018  . IFG (impaired fasting glucose) 08/08/2018  . Vertigo 08/14/2019  . PVC (premature ventricular contraction) 10/23/2019   Resolved Ambulatory Problems    Diagnosis Date Noted  . Asthmatic bronchitis 11/23/2013  . Dehydration  12/11/2015  . Abdominal fullness 04/06/2017   Past Medical History:  Diagnosis Date  . Cataract   . Hypertension   . Kidney stone   . Uterine cancer [facility])      Review of Systems See HPI.     Objective:   Physical Exam Vitals reviewed.  Constitutional:      Appearance: Normal appearance.  HENT:     Head: Normocephalic.     Right Ear: Tympanic membrane normal.     Left Ear: Tympanic membrane normal.     Nose: Nose normal.     Mouth/Throat:     Mouth: Mucous membranes are moist.  Cardiovascular:     Rate and Rhythm: Normal rate.     Pulses: Normal pulses.     Comments: Regular rhythm with multiple pauses.  Pulmonary:     Effort: Pulmonary effort is normal.     Breath sounds: Normal breath sounds.  Abdominal:     General: There is no distension.     Palpations: Abdomen is soft.     Tenderness: There is no abdominal tenderness. There is no right CVA tenderness or left CVA tenderness.  Neurological:     General: No focal deficit present.     Mental Status: She is alert and oriented to person, place, and time.  Psychiatric:     Comments: anxious     .Marland Kitchen Results for orders placed or performed in visit on 10/23/19  COMPLETE METABOLIC PANEL WITH GFR  Result Value Ref Range   Glucose, Bld 107 65 -  139 mg/dL   BUN 14 7 - 25 mg/dL   Creat 1.18 (H) 0.60 - 0.88 mg/dL   GFR, Est Non African American 42 (L) > OR = 60 mL/min/1.47m2   GFR, Est African American 49 (L) > OR = 60 mL/min/1.17m2   BUN/Creatinine Ratio 12 6 - 22 (calc)   Sodium 139 135 - 146 mmol/L   Potassium 4.6 3.5 - 5.3 mmol/L   Chloride 103 98 - 110 mmol/L   CO2 27 20 - 32 mmol/L   Calcium 10.5 (H) 8.6 - 10.4 mg/dL   Total Protein 7.3 6.1 - 8.1 g/dL   Albumin 4.5 3.6 - 5.1 g/dL   Globulin 2.8 1.9 - 3.7 g/dL (calc)   AG Ratio 1.6 1.0 - 2.5 (calc)   Total Bilirubin 0.5 0.2 - 1.2 mg/dL   Alkaline phosphatase (APISO) 56 37 - 153 U/L   AST 17 10 - 35 U/L   ALT 13 6 - 29 U/L  TSH  Result Value Ref Range    TSH 3.15 0.40 - 4.50 mIU/L  CBC  Result Value Ref Range   WBC 12.2 (H) 3.8 - 10.8 Thousand/uL   RBC 4.87 3.80 - 5.10 Million/uL   Hemoglobin 15.4 11.7 - 15.5 g/dL   HCT 45.5 (H) 35.0 - 45.0 %   MCV 93.4 80.0 - 100.0 fL   MCH 31.6 27.0 - 33.0 pg   MCHC 33.8 32.0 - 36.0 g/dL   RDW 12.4 11.0 - 15.0 %   Platelets 313 140 - 400 Thousand/uL   MPV 10.7 7.5 - 12.5 fL  Fe+TIBC+Fer  Result Value Ref Range   Iron 146 45 - 160 mcg/dL   TIBC 380 250 - 450 mcg/dL (calc)   %SAT 38 16 - 45 % (calc)   Ferritin 47 16 - 288 ng/mL  POCT URINALYSIS DIP (CLINITEK)  Result Value Ref Range   Color, UA yellow yellow   Clarity, UA clear clear   Glucose, UA negative negative mg/dL   Bilirubin, UA negative negative   Ketones, POC UA negative negative mg/dL   Spec Grav, UA 1.010 1.010 - 1.025   Blood, UA trace-lysed (A) negative   pH, UA 7.0 5.0 - 8.0   POC PROTEIN,UA negative negative, trace   Urobilinogen, UA 0.2 0.2 or 1.0 E.U./dL   Nitrite, UA Negative Negative   Leukocytes, UA Moderate (2+) (A) Negative         Assessment & Plan:  Marland KitchenMarland KitchenKushi was seen today for hypertension.  Diagnoses and all orders for this visit:  Elevated blood pressure reading -     EKG 12-Lead -     COMPLETE METABOLIC PANEL WITH GFR  Vision changes -     EKG 12-Lead  Essential hypertension -     COMPLETE METABOLIC PANEL WITH GFR -     TSH -     CBC -     Fe+TIBC+Fer -     Ambulatory referral to Cardiology  PVC (premature ventricular contraction) -     COMPLETE METABOLIC PANEL WITH GFR -     TSH -     CBC -     Fe+TIBC+Fer -     Ambulatory referral to Cardiology  Urine frequency -     POCT URINALYSIS DIP (CLINITEK) -     Urine Culture  Acute cystitis with hematuria -     nitrofurantoin, macrocrystal-monohydrate, (MACROBID) 100 MG capsule; Take 1 capsule (100 mg total) by mouth 2 (two) times daily.   EKG did  show sinus rhythm with multiple PVCs.  Likely this is what patient is feeling in her heart is  skipping beats. Discussed PVC and lots of triggers.  Patient requested to see Dr. Stanford Breed.  She is already on metoprolol.  Continue this for now we will allow him to make any adjustments.  In her urine there was moderate leukocytes and blood.  She likely has a urinary tract infection.  Treated with Macrobid for 7 days.  Certainly this could cause her elevation in blood pressure.  We will get thyroid, CBC, iron panel, CMP to reassure patient that everything stable no external factors for her new PVCs.  Follow up with PCP in 2 weeks for recheck.

## 2019-10-24 LAB — URINE CULTURE
MICRO NUMBER:: 10277642
Result:: NO GROWTH
SPECIMEN QUALITY:: ADEQUATE

## 2019-10-24 NOTE — Progress Notes (Signed)
Call pt: thyroid normal stable range. WBC is up and would suspect that with UTI. Finish antibiotic.

## 2019-10-24 NOTE — Progress Notes (Signed)
Call pt:  Kidney function is a little worse from 8 months ago but certainly could be due to infection. No huge change. Calcium is up today. Can we add PTH and vitamin D?

## 2019-10-25 ENCOUNTER — Encounter: Payer: Self-pay | Admitting: *Deleted

## 2019-10-25 ENCOUNTER — Other Ambulatory Visit: Payer: Self-pay

## 2019-10-25 ENCOUNTER — Encounter: Payer: Self-pay | Admitting: Cardiovascular Disease

## 2019-10-25 ENCOUNTER — Ambulatory Visit: Payer: Medicare HMO | Admitting: Cardiovascular Disease

## 2019-10-25 ENCOUNTER — Other Ambulatory Visit: Payer: Self-pay | Admitting: Physician Assistant

## 2019-10-25 VITALS — BP 130/64 | HR 77 | Ht 63.0 in | Wt 129.0 lb

## 2019-10-25 DIAGNOSIS — I10 Essential (primary) hypertension: Secondary | ICD-10-CM | POA: Diagnosis not present

## 2019-10-25 DIAGNOSIS — R0989 Other specified symptoms and signs involving the circulatory and respiratory systems: Secondary | ICD-10-CM

## 2019-10-25 DIAGNOSIS — I493 Ventricular premature depolarization: Secondary | ICD-10-CM

## 2019-10-25 DIAGNOSIS — E559 Vitamin D deficiency, unspecified: Secondary | ICD-10-CM | POA: Insufficient documentation

## 2019-10-25 DIAGNOSIS — E782 Mixed hyperlipidemia: Secondary | ICD-10-CM

## 2019-10-25 MED ORDER — ATORVASTATIN CALCIUM 20 MG PO TABS: 20.0000 mg | ORAL_TABLET | Freq: Every day | ORAL | 1 refills | Status: DC

## 2019-10-25 MED ORDER — VITAMIN D (ERGOCALCIFEROL) 1.25 MG (50000 UNIT) PO CAPS: 50000.0000 [IU] | ORAL_CAPSULE | ORAL | 0 refills | Status: DC

## 2019-10-25 NOTE — Assessment & Plan Note (Signed)
History of essential hypertension with blood pressure measured today 130/64.  She is on amlodipine and metoprolol.

## 2019-10-25 NOTE — Progress Notes (Signed)
Erika Lynch,   There was no growth on urine culture so you can stop antibiotic.  With no infection found in urine and your WBC in CBC elevated. I would like to recheck CBC in the next week to make sure trending down.  Your vitamin D is 10, very low. This could be why your calcium is higher in bloodwork. Start high dose 50,000 units weekly I will send to pharmacy. Recheck in 3 months.

## 2019-10-25 NOTE — Addendum Note (Signed)
Addended by: Therisa Doyne on: 10/25/2019 01:44 PM   Modules accepted: Orders

## 2019-10-25 NOTE — Patient Instructions (Signed)
Medication Instructions:   START Atorvastatin 20 mg daily.  *If you need a refill on your cardiac medications before your next appointment, please call your pharmacy*   Lab Work: Your physician recommends that you return for a FASTING lipid profile and hepatic function panel in 3 months (end of June 2021).  If you have labs (blood work) drawn today and your tests are completely normal, you will receive your results only by: Marland Kitchen MyChart Message (if you have MyChart) OR . A paper copy in the mail If you have any lab test that is abnormal or we need to change your treatment, we will call you to review the results.   Testing/Procedures: Your physician has requested that you have a carotid duplex. This test is an ultrasound of the carotid arteries in your neck. It looks at blood flow through these arteries that supply the brain with blood. Allow one hour for this exam. There are no restrictions or special instructions.  ZIO XT- Long Term Monitor Instructions   Your physician has requested you wear your ZIO patch monitor for 14 days.   This is a single patch monitor.  [employer] supplies one patch monitor per enrollment.  Additional stickers are not available.   Please do not apply patch if you will be having a Nuclear Stress Test, Echocardiogram, Cardiac CT, MRI, or Chest Xray during the time frame you would be wearing the monitor. The patch cannot be worn during these tests.  You cannot remove and re-apply the ZIO XT patch monitor.   Your ZIO patch monitor will be sent USPS Priority mail from [employer] directly to your home address. The monitor may also be mailed to a PO BOX if home delivery is not available.   It may take 3-5 days to receive your monitor after you have been enrolled.   Once you have received you monitor, please review enclosed instructions.  Your monitor has already been registered assigning a specific monitor serial # to you.   Applying the monitor   Shave hair  from upper left chest.   Hold abrader disc by orange tab.  Rub abrader in 40 strokes over left upper chest as indicated in your monitor instructions.   Clean area with 4 enclosed alcohol pads .  Use all pads to assure are is cleaned thoroughly.  Let dry.   Apply patch as indicated in monitor instructions.  Patch will be place under collarbone on left side of chest with arrow pointing upward.   Rub patch adhesive wings for 2 minutes.Remove white label marked "1".  Remove white label marked "2".  Rub patch adhesive wings for 2 additional minutes.   While looking in a mirror, press and release button in center of patch.  A small green light will flash 3-4 times .  This will be your only indicator the monitor has been turned on.     Do not shower for the first 24 hours.  You may shower after the first 24 hours.   Press button if you feel a symptom. You will hear a small click.  Record Date, Time and Symptom in the Patient Log Book.   When you are ready to remove patch, follow instructions on last 2 pages of Patient Log Book.  Stick patch monitor onto last page of Patient Log Book.   Place Patient Log Book in [place] box.  Use locking tab on box and tape box closed securely.  The [place] and [employer] has [employer] on  it.  Please place in mailbox as soon as possible.  Your physician should have your test results approximately 7 days after the monitor has been mailed back to [facility].   Call [place] at [phone] if you have questions regarding your ZIO XT patch monitor.  Call them immediately if you see an orange light blinking on your monitor.   If your monitor falls off in less than 4 days contact our Monitor department at [phone].  If your monitor becomes loose or falls off after 4 days call [employer] at [phone] for suggestions on securing your monitor.     Follow-Up: At [facility], you and your health needs are our priority.  As part of our  continuing mission to provide you with exceptional heart care, we have created designated Provider Care Teams.  These Care Teams include your primary Cardiologist (physician) and Advanced Practice Providers (APPs -  Physician Assistants and Nurse Practitioners) who all work together to provide you with the care you need, when you need it.  We recommend signing up for the patient portal called "MyChart".  Sign up information is provided on this After Visit Summary.  MyChart is used to connect with patients for Virtual Visits (Telemedicine).  Patients are able to view lab/test results, encounter notes, upcoming appointments, etc.  Non-urgent messages can be sent to your provider as well.   To learn more about what you can do with MyChart, go to [email or web address].    Your next appointment:   3 week(s)  The format for your next appointment:   In Person  Provider:   Quay Burow, MD

## 2019-10-25 NOTE — Assessment & Plan Note (Signed)
History of hyperlipidemia not on statin therapy with lipid profile performed 02/06/2019 revealing total cholesterol 224, LDL 149 and HDL of 40.  And then to begin her on atorvastatin 20 mg a day and we will recheck a lipid liver profile in 3 months

## 2019-10-25 NOTE — Progress Notes (Signed)
10/25/2019 Erika Lynch   [birth year]  [id number]  Primary Physician Hali Marry, MD Primary Cardiologist: Lorretta Harp MD Garret Reddish, [place], [place]  HPI:  Erika Lynch is a 86 y.o. widowed Caucasian female mother of 4 children, grandmother of 76 grandchildren (outlived 3 husbands) who was referred by Erika Lynch for evaluation of palpitations and PVCs.  She has a history of hypertension hyperlipidemia.  Her father did have a myocardial infarction.  She is never had a heart attack or stroke.  She denies chest pain or shortness of breath.  She apparently had an issue with her esophagus back in 2009 saw Erika Lynch at that point.  She said no further episodes since that time and is on antireflux medications.  Recently she is noticing palpitations and saw her PCP who noted PVCs on EKG which was referred here for further evaluation.   No outpatient medications have been marked as taking for the 10/25/19 encounter (Office Visit) with Lorretta Harp, MD.     Allergies  Allergen Reactions  . Sulfa Antibiotics Rash  . Aspirin   . Cisapride   . Imipramine Hcl   . Mometasone Swelling    Throat swelling   . Penicillins Rash  . Sulfonamide Derivatives Rash    Social History   Socioeconomic History  . Marital status: Widowed    Spouse name: Not on file  . Number of children: 4  . Years of education: 7th  . Highest education level: 7th grade  Occupational History  . Occupation: Retired.     Employer: RETIRED    Comment: [employer]  Tobacco Use  . Smoking status: Former Research scientist (life sciences)  . Smokeless tobacco: Never Used  Substance and Sexual Activity  . Alcohol use: No  . Drug use: No  . Sexual activity: Not Currently  Other Topics Concern  . Not on file  Social History Narrative   3 living children: Erika Lynch, Erika Lynch.  Daily caffeine use. Regular exercise walks daily for 1 hour at a time.   Social Determinants of Health   Financial Resource Strain:   .  Difficulty of Paying Living Expenses:   Food Insecurity:   . Worried About Charity fundraiser in the Last Year:   . Arboriculturist in the Last Year:   Transportation Needs:   . Film/video editor (Medical):   Marland Kitchen Lack of Transportation (Non-Medical):   Physical Activity:   . Days of Exercise per Week:   . Minutes of Exercise per Session:   Stress:   . Feeling of Stress :   Social Connections:   . Frequency of Communication with Friends and Family:   . Frequency of Social Gatherings with Friends and Family:   . Attends Religious Services:   . Active Member of Clubs or Organizations:   . Attends Archivist Meetings:   Marland Kitchen Marital Status:   Intimate Partner Violence:   . Fear of Current or Ex-Partner:   . Emotionally Abused:   Marland Kitchen Physically Abused:   . Sexually Abused:      Review of Systems: General: negative for chills, fever, night sweats or weight changes.  Cardiovascular: negative for chest pain, dyspnea on exertion, edema, orthopnea, palpitations, paroxysmal nocturnal dyspnea or shortness of breath Dermatological: negative for rash Respiratory: negative for cough or wheezing Urologic: negative for hematuria Abdominal: negative for nausea, vomiting, diarrhea, bright red blood per rectum, melena, or hematemesis Neurologic: negative for visual changes, syncope,  or dizziness All other systems reviewed and are otherwise negative except as noted above.    Blood pressure 130/64, pulse 77, height 5\' 3"  (1.6 m), weight 129 lb (58.5 kg).  General appearance: alert and no distress Neck: no adenopathy, no JVD, supple, symmetrical, trachea midline, thyroid not enlarged, symmetric, no tenderness/mass/nodules and Soft left carotid bruit Lungs: clear to auscultation bilaterally Heart: regular rate and rhythm, S1, S2 normal, no murmur, click, rub or gallop Extremities: extremities normal, atraumatic, no cyanosis or edema Pulses: 2+ and symmetric Skin: Skin color, texture,  turgor normal. No rashes or lesions Neurologic: Alert and oriented X 3, normal strength and tone. Normal symmetric reflexes. Normal coordination and gait  EKG normal sinus rhythm at 77 with nonspecific ST and T wave changes, septal Q waves and with occasional PVC.  I personally reviewed this EKG.  ASSESSMENT AND PLAN:   Hyperlipidemia History of hyperlipidemia not on statin therapy with lipid profile performed 02/06/2019 revealing total cholesterol 224, LDL 149 and HDL of 40.  And then to begin her on atorvastatin 20 mg a day and we will recheck a lipid liver profile in 3 months  Essential hypertension History of essential hypertension with blood pressure measured today 130/64.  She is on amlodipine and metoprolol.  PVC (premature ventricular contraction) Ms. Langelier was referred here by Erika Planas PA-C for PVCs.  She has had palpitations in the last several weeks.  She has 1 PVC on her twelve-lead EKG today.  I am going to obtain a 2-week Zio patch to further evaluate      Lorretta Harp MD [facility], [facility] 10/25/2019 1:34 PM

## 2019-10-25 NOTE — Assessment & Plan Note (Signed)
Erika Lynch was referred here by Iran Planas PA-C for PVCs.  She has had palpitations in the last several weeks.  She has 1 PVC on her twelve-lead EKG today.  I am going to obtain a 2-week Zio patch to further evaluate

## 2019-10-25 NOTE — Progress Notes (Signed)
Patient ID: Erika Lynch, female   DOB: 1933-05-08, 86 y.o.   MRN: [id number] Patient enrolled for [employer] to mail a 14 day ZIO XT long term holter monitor to her home.

## 2019-10-26 LAB — CBC
HCT: 45.5 % — ABNORMAL HIGH (ref 35.0–45.0)
Hemoglobin: 15.4 g/dL (ref 11.7–15.5)
MCH: 31.6 pg (ref 27.0–33.0)
MCHC: 33.8 g/dL (ref 32.0–36.0)
MCV: 93.4 fL (ref 80.0–100.0)
MPV: 10.7 fL (ref 7.5–12.5)
Platelets: 313 10*3/uL (ref 140–400)
RBC: 4.87 10*6/uL (ref 3.80–5.10)
RDW: 12.4 % (ref 11.0–15.0)
WBC: 12.2 10*3/uL — ABNORMAL HIGH (ref 3.8–10.8)

## 2019-10-26 LAB — TEST AUTHORIZATION

## 2019-10-26 LAB — COMPLETE METABOLIC PANEL WITH GFR
AG Ratio: 1.6 (calc) (ref 1.0–2.5)
ALT: 13 U/L (ref 6–29)
AST: 17 U/L (ref 10–35)
Albumin: 4.5 g/dL (ref 3.6–5.1)
Alkaline phosphatase (APISO): 56 U/L (ref 37–153)
BUN/Creatinine Ratio: 12 (calc) (ref 6–22)
BUN: 14 mg/dL (ref 7–25)
CO2: 27 mmol/L (ref 20–32)
Calcium: 10.5 mg/dL — ABNORMAL HIGH (ref 8.6–10.4)
Chloride: 103 mmol/L (ref 98–110)
Creat: 1.18 mg/dL — ABNORMAL HIGH (ref 0.60–0.88)
GFR, Est African American: 49 mL/min/{1.73_m2} — ABNORMAL LOW (ref >=60)
GFR, Est Non African American: 42 mL/min/{1.73_m2} — ABNORMAL LOW (ref >=60)
Globulin: 2.8 g/dL (calc) (ref 1.9–3.7)
Glucose, Bld: 107 mg/dL (ref 65–139)
Potassium: 4.6 mmol/L (ref 3.5–5.3)
Sodium: 139 mmol/L (ref 135–146)
Total Bilirubin: 0.5 mg/dL (ref 0.2–1.2)
Total Protein: 7.3 g/dL (ref 6.1–8.1)

## 2019-10-26 LAB — IRON,TIBC AND FERRITIN PANEL
%SAT: 38 % (calc) (ref 16–45)
Ferritin: 47 ng/mL (ref 16–288)
Iron: 146 ug/dL (ref 45–160)
TIBC: 380 mcg/dL (calc) (ref 250–450)

## 2019-10-26 LAB — PARATHYROID HORMONE, INTACT (NO CA): PTH: 36 pg/mL (ref 14–64)

## 2019-10-26 LAB — TSH: TSH: 3.15 mIU/L (ref 0.40–4.50)

## 2019-10-26 LAB — VITAMIN D 25 HYDROXY (VIT D DEFICIENCY, FRACTURES): Vit D, 25-Hydroxy: 10 ng/mL — ABNORMAL LOW (ref 30–100)

## 2019-10-27 ENCOUNTER — Ambulatory Visit (HOSPITAL_COMMUNITY)
Admission: RE | Admit: 2019-10-27 | Discharge: 2019-10-27 | Disposition: A | Discharge status: Home / Self Care | Payer: Medicare HMO | Source: Ambulatory Visit | Attending: Internal Medicine | Admitting: Internal Medicine

## 2019-10-27 ENCOUNTER — Other Ambulatory Visit: Payer: Self-pay

## 2019-10-27 DIAGNOSIS — R0989 Other specified symptoms and signs involving the circulatory and respiratory systems: Secondary | ICD-10-CM | POA: Diagnosis not present

## 2019-10-27 NOTE — Progress Notes (Signed)
Normal PTH. Likely if you get your vitamin D up then your calcium will normalize. Recheck in 3 months.

## 2019-11-04 ENCOUNTER — Ambulatory Visit (INDEPENDENT_AMBULATORY_CARE_PROVIDER_SITE_OTHER): Payer: Medicare HMO

## 2019-11-04 DIAGNOSIS — I493 Ventricular premature depolarization: Secondary | ICD-10-CM

## 2019-11-05 DIAGNOSIS — Z888 Allergy status to other drugs, medicaments and biological substances status: Secondary | ICD-10-CM | POA: Diagnosis not present

## 2019-11-05 DIAGNOSIS — I6381 Other cerebral infarction due to occlusion or stenosis of small artery: Secondary | ICD-10-CM | POA: Diagnosis not present

## 2019-11-05 DIAGNOSIS — R4182 Altered mental status, unspecified: Secondary | ICD-10-CM | POA: Diagnosis not present

## 2019-11-05 DIAGNOSIS — Z7951 Long term (current) use of inhaled steroids: Secondary | ICD-10-CM | POA: Diagnosis not present

## 2019-11-05 DIAGNOSIS — Z882 Allergy status to sulfonamides status: Secondary | ICD-10-CM | POA: Diagnosis not present

## 2019-11-05 DIAGNOSIS — E785 Hyperlipidemia, unspecified: Secondary | ICD-10-CM | POA: Diagnosis not present

## 2019-11-05 DIAGNOSIS — I493 Ventricular premature depolarization: Secondary | ICD-10-CM | POA: Diagnosis not present

## 2019-11-05 DIAGNOSIS — I491 Atrial premature depolarization: Secondary | ICD-10-CM | POA: Diagnosis not present

## 2019-11-05 DIAGNOSIS — R42 Dizziness and giddiness: Secondary | ICD-10-CM | POA: Diagnosis not present

## 2019-11-05 DIAGNOSIS — I10 Essential (primary) hypertension: Secondary | ICD-10-CM | POA: Diagnosis not present

## 2019-11-05 DIAGNOSIS — F17210 Nicotine dependence, cigarettes, uncomplicated: Secondary | ICD-10-CM | POA: Diagnosis not present

## 2019-11-05 DIAGNOSIS — Z880 Allergy status to penicillin: Secondary | ICD-10-CM | POA: Diagnosis not present

## 2019-11-05 DIAGNOSIS — R5381 Other malaise: Secondary | ICD-10-CM | POA: Diagnosis not present

## 2019-11-05 DIAGNOSIS — Z79899 Other long term (current) drug therapy: Secondary | ICD-10-CM | POA: Diagnosis not present

## 2019-11-05 MED ORDER — GENERIC EXTERNAL MEDICATION
Status: DC
Start: ? — End: 2019-11-05

## 2019-11-05 MED ORDER — SODIUM CHLORIDE 0.9 % IV SOLN
10.00 | INTRAVENOUS | Status: DC
Start: ? — End: 2019-11-05

## 2019-11-06 ENCOUNTER — Other Ambulatory Visit: Payer: Self-pay | Admitting: Family Medicine

## 2019-11-06 DIAGNOSIS — I10 Essential (primary) hypertension: Secondary | ICD-10-CM

## 2019-11-21 ENCOUNTER — Ambulatory Visit: Payer: Medicare HMO | Admitting: Cardiovascular Disease

## 2019-11-22 DIAGNOSIS — I493 Ventricular premature depolarization: Secondary | ICD-10-CM | POA: Diagnosis not present

## 2019-11-28 ENCOUNTER — Encounter: Payer: Self-pay | Admitting: Cardiovascular Disease

## 2019-11-28 ENCOUNTER — Other Ambulatory Visit: Payer: Self-pay

## 2019-11-28 ENCOUNTER — Ambulatory Visit: Payer: Medicare HMO | Admitting: Cardiovascular Disease

## 2019-11-28 VITALS — BP 124/68 | HR 75 | Temp 97.3°F | Resp 18 | Ht 63.0 in | Wt 128.8 lb

## 2019-11-28 DIAGNOSIS — R002 Palpitations: Secondary | ICD-10-CM

## 2019-11-28 MED ORDER — METOPROLOL TARTRATE 50 MG PO TABS: 75.0000 mg | ORAL_TABLET | Freq: Two times a day (BID) | ORAL | 1 refills | Status: DC

## 2019-11-28 NOTE — Addendum Note (Signed)
Addended by: Caprice Beaver T on: 11/28/2019 04:44 PM   Modules accepted: Orders

## 2019-11-28 NOTE — Progress Notes (Signed)
Erika Lynch returns today for follow-up of her event monitor ordered and evaluation of palpitations.  This revealed occasional PACs and PVCs with short runs of SVT.  Going to increase her metoprolol from 50 mg p.o. twice daily to 75 mg p.o. twice daily.  We will check a fasting lipid liver profile and I will see her back in the office in 3 months for follow-up.  Lorretta Harp, M.D., [place], [facility], [place] [address] [place],   [address]  [phone] 11/28/2019 4:40 PM

## 2019-11-28 NOTE — Patient Instructions (Signed)
Medication Instructions:  Increase Metoprolol to 75 mg twice daily   *If you need a refill on your cardiac medications before your next appointment, please call your pharmacy*   Lab Work: LIPID and LIVER Attached are the lab orders that are needed before your upcoming appointment, please come in anytime to have your labs drawn.   They are fasting labs, so nothing to eat or drink after midnight.  Lab hours: 8:00-4:00 lunch hours 12:45-1:45  If you have labs (blood work) drawn today and your tests are completely normal, you will receive your results only by: Marland Kitchen MyChart Message (if you have MyChart) OR . A paper copy in the mail If you have any lab test that is abnormal or we need to change your treatment, we will call you to review the results.   Follow-Up: At [facility], you and your health needs are our priority.  As part of our continuing mission to provide you with exceptional heart care, we have created designated Provider Care Teams.  These Care Teams include your primary Cardiologist (physician) and Advanced Practice Providers (APPs -  Physician Assistants and Nurse Practitioners) who all work together to provide you with the care you need, when you need it.  We recommend signing up for the patient portal called "MyChart".  Sign up information is provided on this After Visit Summary.  MyChart is used to connect with patients for Virtual Visits (Telemedicine).  Patients are able to view lab/test results, encounter notes, upcoming appointments, etc.  Non-urgent messages can be sent to your provider as well.   To learn more about what you can do with MyChart, go to [email or web address].    Your next appointment:   3 month(s)  The format for your next appointment:   In Person  Provider:   Quay Burow, MD

## 2019-11-30 DIAGNOSIS — R002 Palpitations: Secondary | ICD-10-CM | POA: Diagnosis not present

## 2019-11-30 LAB — LIPID PANEL
Chol/HDL Ratio: 3.1 ratio (ref 0.0–4.4)
Cholesterol, Total: 141 mg/dL (ref 100–199)
HDL: 45 mg/dL (ref >39)
LDL Chol Calc (NIH): 79 mg/dL (ref 0–99)
Triglycerides: 91 mg/dL (ref 0–149)
VLDL Cholesterol Cal: 17 mg/dL (ref 5–40)

## 2019-11-30 LAB — HEPATIC FUNCTION PANEL
ALT: 14 IU/L (ref 0–32)
AST: 17 IU/L (ref 0–40)
Albumin: 4.2 g/dL (ref 3.6–4.6)
Alkaline Phosphatase: 74 IU/L (ref 39–117)
Bilirubin Total: 0.3 mg/dL (ref 0.0–1.2)
Bilirubin, Direct: 0.08 mg/dL (ref 0.00–0.40)
Total Protein: 7 g/dL (ref 6.0–8.5)

## 2019-12-06 ENCOUNTER — Telehealth: Payer: Self-pay | Admitting: Family Medicine

## 2019-12-06 ENCOUNTER — Other Ambulatory Visit: Payer: Medicare HMO

## 2019-12-06 ENCOUNTER — Telehealth: Payer: Self-pay

## 2019-12-06 NOTE — Telephone Encounter (Signed)
See other note

## 2019-12-06 NOTE — Telephone Encounter (Signed)
Patient called office stating that she was having some bad palpitations and wanted to get in to see Dr Madilyn Fireman ASAP. Patient was very short of breath while speaking with me on the phone and made the comment "I will call 911 if you think that would be better". Patient states she recently went to the hospital for this. She does not have transportation, daughter cannot be at her house until close to 12:30.   I advised patient that she needs to call 911 and be evaluated. Patient was hesitant.States she did not reach out to her cardiologist. Advised patient she can go ahead and reach out to him, but she needed to be evaluated TODAY and if symptoms worsen before she hears back from cards, she needs to call 911.  Patient agreeable to plan. FYI to PCP

## 2019-12-06 NOTE — Telephone Encounter (Signed)
Patient called, stating that her heart had been missing beats, beating a little faster in the morning & wanted to be checked out, I offered her the only appt we had left for the day with Dr. Darene Lamer at 11:30am and she didn't want to take that appt, transferred the call to triage/Rachel. AM

## 2019-12-08 ENCOUNTER — Other Ambulatory Visit: Payer: Self-pay | Admitting: Family Medicine

## 2019-12-08 DIAGNOSIS — J4520 Mild intermittent asthma, uncomplicated: Secondary | ICD-10-CM

## 2020-01-17 ENCOUNTER — Other Ambulatory Visit: Payer: Self-pay | Admitting: Family Medicine

## 2020-01-17 DIAGNOSIS — J449 Chronic obstructive pulmonary disease, unspecified: Secondary | ICD-10-CM

## 2020-01-19 ENCOUNTER — Other Ambulatory Visit: Payer: Self-pay | Admitting: Family Medicine

## 2020-01-19 DIAGNOSIS — J4520 Mild intermittent asthma, uncomplicated: Secondary | ICD-10-CM

## 2020-02-03 ENCOUNTER — Other Ambulatory Visit: Payer: Self-pay | Admitting: Family Medicine

## 2020-02-08 ENCOUNTER — Other Ambulatory Visit: Payer: Self-pay | Admitting: Family Medicine

## 2020-02-09 ENCOUNTER — Encounter: Payer: Self-pay | Admitting: Family Medicine

## 2020-02-09 ENCOUNTER — Ambulatory Visit (INDEPENDENT_AMBULATORY_CARE_PROVIDER_SITE_OTHER): Payer: Medicare HMO | Admitting: Family Medicine

## 2020-02-09 VITALS — BP 130/70 | HR 72 | Ht 63.0 in | Wt 130.0 lb

## 2020-02-09 DIAGNOSIS — H8110 Benign paroxysmal vertigo, unspecified ear: Secondary | ICD-10-CM | POA: Diagnosis not present

## 2020-02-09 DIAGNOSIS — N1831 Chronic kidney disease, stage 3a: Secondary | ICD-10-CM | POA: Diagnosis not present

## 2020-02-09 DIAGNOSIS — R7301 Impaired fasting glucose: Secondary | ICD-10-CM

## 2020-02-09 DIAGNOSIS — I10 Essential (primary) hypertension: Secondary | ICD-10-CM | POA: Diagnosis not present

## 2020-02-09 DIAGNOSIS — R42 Dizziness and giddiness: Secondary | ICD-10-CM | POA: Diagnosis not present

## 2020-02-09 LAB — POCT GLYCOSYLATED HEMOGLOBIN (HGB A1C): Hemoglobin A1C: 5.8 % — AB (ref 4.0–5.6)

## 2020-02-09 NOTE — Progress Notes (Signed)
She continues to have issues with vertigo. This hasn't gotten better. She stated that today isn't to bad however, overall it is really bad.  She said that she finished the Atorvastatin. Looking at her medication list this medication expired on 01/23/2020.

## 2020-02-09 NOTE — Patient Instructions (Signed)
Will work on referral for the vertigo.

## 2020-02-09 NOTE — Assessment & Plan Note (Signed)
Discussed getting her in for vestibular rehab again.  She really is not able to drive so we will work on getting it set up through home health again she did well with this back in 2017 and says she got relief for quite some time she says she is actually continue to do a lot of the home exercises but they just do not seem to be helping like they did at one time.  She still has some meclizine at home which she can use as needed.

## 2020-02-09 NOTE — Assessment & Plan Note (Signed)
Well controlled. Continue current regimen. Follow up in  6 mo  

## 2020-02-09 NOTE — Assessment & Plan Note (Signed)
A1c looks fantastic today at 5.8.  It is up a little bit from previous and we did discuss just making sure that she is not getting into too many carbs or sweets.  But overall it is stable and looks good we will plan to recheck again in 6 months.

## 2020-02-09 NOTE — Assessment & Plan Note (Signed)
Only renal function every 6 months.  Last creatinine was 1.1 in March.  She will be due again in September.

## 2020-02-09 NOTE — Progress Notes (Signed)
Established Patient Office Visit  Subjective:  Patient ID: Erika Lynch, female    DOB: Dec 13, 1932  Age: 86 y.o. MRN: [id number]  CC:  Chief Complaint  Patient presents with  . Hypertension  . ifg    HPI Erika Lynch presents for   Hypertension- Pt denies chest pain, SOB, dizziness, or heart palpitations.  Taking meds as directed w/o problems.  Denies medication side effects.    Impaired fasting glucose-no increased thirst or urination. No symptoms consistent with hypoglycemia.   She continues to have issues with vertigo. This hasn't gotten better. She stated that today isn't to bad however, overall it is really bad.  She is actually had vertigo on and off for years but says it was severe enough that she actually called EMS on April 4 and was seen at the emergency department.  The lasted vestibular rehab in 2017 via home health.  She was given some Antivert pills this time and she says they are helpful they do take the edge off.  She says she is been waking up with it every single day it seems to get a little bit better by the evening.  She says she also feels like her left ear closes up when the air conditioning hits that she wonders if she could have some fluid in her ear that could be making her vertigo worse.  Denies any significant nausea and no vomiting.  She said that she finished the Atorvastatin. Looking at her medication list this medication expired on 01/23/2020.   Past Medical History:  Diagnosis Date  . Cataract   . Dyskinesia of esophagus   . Hypertension   . Kidney stone   . Uterine cancer [facility])     Past Surgical History:  Procedure Laterality Date  . ABDOMINAL HYSTERECTOMY     Uterine cancer  . BACK SURGERY      Family History  Problem Relation Age of Onset  . Hypertension Father   . Hyperlipidemia Father   . Heart attack Father   . Stroke Mother   . Heart attack Brother   . Heart attack Brother   . Colon cancer Sister   . Breast cancer Other      Social History   Socioeconomic History  . Marital status: Widowed    Spouse name: Not on file  . Number of children: 4  . Years of education: 7th  . Highest education level: 7th grade  Occupational History  . Occupation: Retired.     Employer: RETIRED    Comment: [employer]  Tobacco Use  . Smoking status: Former Research scientist (life sciences)  . Smokeless tobacco: Never Used  Vaping Use  . Vaping Use: Never used  Substance and Sexual Activity  . Alcohol use: No  . Drug use: No  . Sexual activity: Not Currently  Other Topics Concern  . Not on file  Social History Narrative   3 living children: [name], Danny.  Daily caffeine use. Regular exercise walks daily for 1 hour at a time.   Social Determinants of Health   Financial Resource Strain:   . Difficulty of Paying Living Expenses:   Food Insecurity:   . Worried About Charity fundraiser in the Last Year:   . Arboriculturist in the Last Year:   Transportation Needs:   . Film/video editor (Medical):   Marland Kitchen Lack of Transportation (Non-Medical):   Physical Activity:   . Days of Exercise per Week:   . Minutes  of Exercise per Session:   Stress:   . Feeling of Stress :   Social Connections:   . Frequency of Communication with Friends and Family:   . Frequency of Social Gatherings with Friends and Family:   . Attends Religious Services:   . Active Member of Clubs or Organizations:   . Attends Archivist Meetings:   Marland Kitchen Marital Status:   Intimate Partner Violence:   . Fear of Current or Ex-Partner:   . Emotionally Abused:   Marland Kitchen Physically Abused:   . Sexually Abused:     Outpatient Medications Prior to Visit  Medication Sig Dispense Refill  . albuterol (VENTOLIN HFA) 108 (90 Base) MCG/ACT inhaler INHALE 2 PUFFS INTO THE LUNGS EVERY 6 HOURS AS NEEDED FOR WHEEZING OR SHORTNESS OF BREATH. 18 g 1  . amLODipine (NORVASC) 5 MG tablet TAKE 1 TABLET EVERY DAY 90 tablet 1  . fluticasone (FLONASE) 50 MCG/ACT nasal spray USE 1  SPRAY INTO BOTH NOSTRILS DAILY 32 g 3  . lansoprazole (PREVACID) 15 MG capsule Take 1 capsule (15 mg total) by mouth every morning. 30 capsule 0  . meclizine (ANTIVERT) 25 MG tablet TAKE 1 TABLET (25 MG TOTAL) BY MOUTH DAILY AS NEEDED FOR DIZZINESS. 90 tablet 1  . metoprolol tartrate (LOPRESSOR) 50 MG tablet Take 1.5 tablets (75 mg total) by mouth 2 (two) times daily. 180 tablet 1  . omeprazole (PRILOSEC) 40 MG capsule TAKE 1 CAPSULE EVERY DAY 90 capsule 3  . STIOLTO RESPIMAT 2.5-2.5 MCG/ACT AERS INHALE 2 PUFFS INTO THE LUNGS DAILY. 12 g 3  . vitamin B-12 (CYANOCOBALAMIN) 50 MCG tablet Take 50 mcg by mouth daily.    . Vitamin D, Ergocalciferol, (DRISDOL) 1.25 MG (50000 UNIT) CAPS capsule Take 1 capsule (50,000 Units total) by mouth every 7 (seven) days. 12 capsule 0  . nitrofurantoin, macrocrystal-monohydrate, (MACROBID) 100 MG capsule Take 1 capsule (100 mg total) by mouth 2 (two) times daily. 14 capsule 0  . atorvastatin (LIPITOR) 20 MG tablet Take 1 tablet (20 mg total) by mouth daily. 90 tablet 1   No facility-administered medications prior to visit.    Allergies  Allergen Reactions  . Sulfa Antibiotics Rash  . Aspirin   . Cisapride   . Imipramine Hcl   . Mometasone Swelling    Throat swelling   . Penicillins Rash  . Sulfonamide Derivatives Rash    ROS Review of Systems    Objective:    Physical Exam Constitutional:      Appearance: She is well-developed.  HENT:     Head: Normocephalic and atraumatic.     Right Ear: External ear normal.     Left Ear: External ear normal.     Nose: Nose normal.  Eyes:     Conjunctiva/sclera: Conjunctivae normal.     Pupils: Pupils are equal, round, and reactive to light.  Neck:     Thyroid: No thyromegaly.  Cardiovascular:     Rate and Rhythm: Normal rate and regular rhythm.     Heart sounds: Normal heart sounds.  Pulmonary:     Effort: Pulmonary effort is normal.     Breath sounds: Normal breath sounds. No wheezing.   Musculoskeletal:     Cervical back: Neck supple. No tenderness.  Lymphadenopathy:     Cervical: No cervical adenopathy.  Skin:    General: Skin is warm and dry.  Neurological:     Mental Status: She is alert and oriented to person, place, and time.  Psychiatric:  Behavior: Behavior normal.     BP 130/70   Pulse 72   Ht 5\' 3"  (1.6 m)   Wt 130 lb (59 kg)   SpO2 96%   BMI 23.03 kg/m  Wt Readings from Last 3 Encounters:  02/09/20 130 lb (59 kg)  11/28/19 128 lb 12.8 oz (58.4 kg)  10/25/19 129 lb (58.5 kg)     There are no preventive care reminders to display for this patient.  There are no preventive care reminders to display for this patient.  Lab Results  Component Value Date   TSH 3.15 10/23/2019   Lab Results  Component Value Date   WBC 12.2 (H) 10/23/2019   HGB 15.4 10/23/2019   HCT 45.5 (H) 10/23/2019   MCV 93.4 10/23/2019   PLT 313 10/23/2019   Lab Results  Component Value Date   NA 139 10/23/2019   K 4.6 10/23/2019   CO2 27 10/23/2019   GLUCOSE 107 10/23/2019   BUN 14 10/23/2019   CREATININE 1.18 (H) 10/23/2019   BILITOT 0.3 11/30/2019   ALKPHOS 74 11/30/2019   AST 17 11/30/2019   ALT 14 11/30/2019   PROT 7.0 11/30/2019   ALBUMIN 4.2 11/30/2019   CALCIUM 10.5 (H) 10/23/2019   Lab Results  Component Value Date   CHOL 141 11/30/2019   Lab Results  Component Value Date   HDL 45 11/30/2019   Lab Results  Component Value Date   LDLCALC 79 11/30/2019   Lab Results  Component Value Date   TRIG 91 11/30/2019   Lab Results  Component Value Date   CHOLHDL 3.1 11/30/2019   Lab Results  Component Value Date   HGBA1C 5.8 (A) 02/09/2020      Assessment & Plan:   Problem List Items Addressed This Visit      Cardiovascular and Mediastinum   Essential hypertension    Well controlled. Continue current regimen. Follow up in  6 mo        Endocrine   IFG (impaired fasting glucose) - Primary    A1c looks fantastic today at 5.8.   It is up a little bit from previous and we did discuss just making sure that she is not getting into too many carbs or sweets.  But overall it is stable and looks good we will plan to recheck again in 6 months.      Relevant Orders   POCT glycosylated hemoglobin (Hb A1C) (Completed)     Nervous and Auditory   BPPV (benign paroxysmal positional vertigo)    Discussed getting her in for vestibular rehab again.  She really is not able to drive so we will work on getting it set up through home health again she did well with this back in 2017 and says she got relief for quite some time she says she is actually continue to do a lot of the home exercises but they just do not seem to be helping like they did at one time.  She still has some meclizine at home which she can use as needed.        Genitourinary   CKD stage G3a/A1, GFR 45-59 and albumin creatinine ratio <30 mg/g (HCC)    Only renal function every 6 months.  Last creatinine was 1.1 in March.  She will be due again in September.         No orders of the defined types were placed in this encounter.   Follow-up: Return in about 4 months (around 06/11/2020)  for Hypertension.    Beatrice Lecher, MD

## 2020-02-12 ENCOUNTER — Ambulatory Visit: Payer: Medicare HMO | Admitting: Family Medicine

## 2020-02-12 DIAGNOSIS — H524 Presbyopia: Secondary | ICD-10-CM | POA: Diagnosis not present

## 2020-02-12 DIAGNOSIS — Z961 Presence of intraocular lens: Secondary | ICD-10-CM | POA: Diagnosis not present

## 2020-02-16 ENCOUNTER — Other Ambulatory Visit: Payer: Self-pay | Admitting: Cardiovascular Disease

## 2020-02-16 NOTE — Telephone Encounter (Signed)
Rx(s) sent to pharmacy electronically.  

## 2020-03-01 ENCOUNTER — Other Ambulatory Visit: Payer: Self-pay | Admitting: Family Medicine

## 2020-03-01 DIAGNOSIS — J4520 Mild intermittent asthma, uncomplicated: Secondary | ICD-10-CM

## 2020-03-05 ENCOUNTER — Encounter: Payer: Self-pay | Admitting: Cardiovascular Disease

## 2020-03-05 ENCOUNTER — Other Ambulatory Visit: Payer: Self-pay

## 2020-03-05 ENCOUNTER — Ambulatory Visit: Payer: Medicare HMO | Admitting: Cardiovascular Disease

## 2020-03-05 DIAGNOSIS — R002 Palpitations: Secondary | ICD-10-CM | POA: Diagnosis not present

## 2020-03-05 DIAGNOSIS — E782 Mixed hyperlipidemia: Secondary | ICD-10-CM | POA: Diagnosis not present

## 2020-03-05 DIAGNOSIS — I10 Essential (primary) hypertension: Secondary | ICD-10-CM | POA: Diagnosis not present

## 2020-03-05 NOTE — Patient Instructions (Signed)
Medication Instructions:  °Your physician recommends that you continue on your current medications as directed. Please refer to the Current Medication list given to you today. ° °*If you need a refill on your cardiac medications before your next appointment, please call your pharmacy* ° °Lab Work: °NONE ordered at this time of appointment  ° °If you have labs (blood work) drawn today and your tests are completely normal, you will receive your results only by: °• MyChart Message (if you have MyChart) OR °• A paper copy in the mail °If you have any lab test that is abnormal or we need to change your treatment, we will call you to review the results. ° °Testing/Procedures: °NONE ordered at this time of appointment  ° °Follow-Up: °At [facility], you and your health needs are our priority.  As part of our continuing mission to provide you with exceptional heart care, we have created designated Provider Care Teams.  These Care Teams include your primary Cardiologist (physician) and Advanced Practice Providers (APPs -  Physician Assistants and Nurse Practitioners) who all work together to provide you with the care you need, when you need it. ° °We recommend signing up for the patient portal called "MyChart".  Sign up information is provided on this After Visit Summary.  MyChart is used to connect with patients for Virtual Visits (Telemedicine).  Patients are able to view lab/test results, encounter notes, upcoming appointments, etc.  Non-urgent messages can be sent to your provider as well.   °To learn more about what you can do with MyChart, go to [email or web address].   ° °Your next appointment:   °1 year(s) ° °The format for your next appointment:   °In Person ° °Provider:   °Jonathan Berry, MD ° °Other Instructions ° ° °

## 2020-03-05 NOTE — Progress Notes (Signed)
03/05/2020 Erika Lynch   12/06/1932  [id number]  Primary Physician Hali Marry, MD Primary Cardiologist: Lorretta Harp MD Lupe Lynch, Erika  HPI:  Erika Lynch is a 86 y.o.  widowed Caucasian female mother of 4 children, grandmother of 68 grandchildren (outlived 3 husbands) who was referred by Erika Planas NP for evaluation of palpitations and PVCs.  I last saw her in the office 11/28/2019. She has a history of hypertension hyperlipidemia.  Her father did have a myocardial infarction.  She is never had a heart attack or stroke.  She denies chest pain or shortness of breath.  She apparently had an issue with her esophagus back in 2009 saw Dr. Stanford Breed at that point.  She said no further episodes since that time and is on antireflux medications.  Recently she is noticing palpitations and saw her PCP who noted PVCs on EKG which was referred here for further evaluation.  Since I saw her 4 months ago I did uptitrate her beta-blocker which resulted in marked improvement in her palpitations.  Her major complaint is pain between her shoulder blades which is somewhat positional and sounds either orthopedic or neurogenic.  She continues to deny chest pain or shortness of breath.   Current Meds  Medication Sig  . albuterol (VENTOLIN HFA) 108 (90 Base) MCG/ACT inhaler INHALE 2 PUFFS INTO THE LUNGS EVERY 6 HOURS AS NEEDED FOR WHEEZING OR SHORTNESS OF BREATH.  Marland Kitchen amLODipine (NORVASC) 5 MG tablet TAKE 1 TABLET EVERY DAY  . fluticasone (FLONASE) 50 MCG/ACT nasal spray USE 1 SPRAY INTO BOTH NOSTRILS DAILY  . lansoprazole (PREVACID) 15 MG capsule Take 1 capsule (15 mg total) by mouth every morning.  . meclizine (ANTIVERT) 25 MG tablet TAKE 1 TABLET (25 MG TOTAL) BY MOUTH DAILY AS NEEDED FOR DIZZINESS.  . metoprolol tartrate (LOPRESSOR) 50 MG tablet Take 1.5 tablets (75 mg total) by mouth 2 (two) times daily.  Marland Kitchen omeprazole (PRILOSEC) 40 MG capsule TAKE 1 CAPSULE EVERY DAY  . STIOLTO  RESPIMAT 2.5-2.5 MCG/ACT AERS INHALE 2 PUFFS INTO THE LUNGS DAILY.  . vitamin B-12 (CYANOCOBALAMIN) 50 MCG tablet Take 50 mcg by mouth daily.  . Vitamin D, Ergocalciferol, (DRISDOL) 1.25 MG (50000 UNIT) CAPS capsule Take 1 capsule (50,000 Units total) by mouth every 7 (seven) days.     Allergies  Allergen Reactions  . Sulfa Antibiotics Rash  . Aspirin   . Cisapride   . Imipramine Hcl   . Mometasone Swelling    Throat swelling   . Penicillins Rash  . Sulfonamide Derivatives Rash    Social History   Socioeconomic History  . Marital status: Widowed    Spouse name: Not on file  . Number of children: 4  . Years of education: 7th  . Highest education level: 7th grade  Occupational History  . Occupation: Retired.     Employer: RETIRED    Comment: [employer]  Tobacco Use  . Smoking status: Former Research scientist (life sciences)  . Smokeless tobacco: Never Used  Vaping Use  . Vaping Use: Never used  Substance and Sexual Activity  . Alcohol use: No  . Drug use: No  . Sexual activity: Not Currently  Other Topics Concern  . Not on file  Social History Narrative   3 living children: Erika Lynch, Erika Lynch.  Daily caffeine use. Regular exercise walks daily for 1 hour at a time.   Social Determinants of Health   Financial Resource Strain:   . Difficulty of  Paying Living Expenses:   Food Insecurity:   . Worried About Charity fundraiser in the Last Year:   . Arboriculturist in the Last Year:   Transportation Needs:   . Film/video editor (Medical):   Marland Kitchen Lack of Transportation (Non-Medical):   Physical Activity:   . Days of Exercise per Week:   . Minutes of Exercise per Session:   Stress:   . Feeling of Stress :   Social Connections:   . Frequency of Communication with Friends and Family:   . Frequency of Social Gatherings with Friends and Family:   . Attends Religious Services:   . Active Member of Clubs or Organizations:   . Attends Archivist Meetings:   Marland Kitchen Marital Status:     Intimate Partner Violence:   . Fear of Current or Ex-Partner:   . Emotionally Abused:   Marland Kitchen Physically Abused:   . Sexually Abused:      Review of Systems: General: negative for chills, fever, night sweats or weight changes.  Cardiovascular: negative for chest pain, dyspnea on exertion, edema, orthopnea, palpitations, paroxysmal nocturnal dyspnea or shortness of breath Dermatological: negative for rash Respiratory: negative for cough or wheezing Urologic: negative for hematuria Abdominal: negative for nausea, vomiting, diarrhea, bright red blood per rectum, melena, or hematemesis Neurologic: negative for visual changes, syncope, or dizziness All other systems reviewed and are otherwise negative except as noted above.    Blood pressure (!) 142/68, pulse 78, height 5\' 3"  (1.6 m), weight 129 lb 3.2 oz (58.6 kg), SpO2 95 %.  General appearance: alert and no distress Neck: no adenopathy, no carotid bruit, no JVD, supple, symmetrical, trachea midline and thyroid not enlarged, symmetric, no tenderness/mass/nodules Lungs: clear to auscultation bilaterally Heart: regular rate and rhythm, S1, S2 normal, no murmur, click, rub or gallop Extremities: extremities normal, atraumatic, no cyanosis or edema Pulses: 2+ and symmetric Skin: Skin color, texture, turgor normal. No rashes or lesions Neurologic: Alert and oriented X 3, normal strength and tone. Normal symmetric reflexes. Normal coordination and gait  EKG not performed today.  ASSESSMENT AND PLAN:   Hyperlipidemia History of hyperlipidemia not on statin therapy with lipid profile performed 11/30/2019 revealing total pressure 141, LDL 79 HDL 45.  Essential hypertension History of essential hypertension a blood pressure measured today at 142/68.  She is on amlodipin, and metoprolol.  PALPITATIONS History of palpitations improved with up titration of her metoprolol from 50 to 75 mg p.o. twice daily.      Lorretta Harp MD  FACP,FACC,FAHA, FSCAI 03/05/2020 1:40 PM

## 2020-03-05 NOTE — Assessment & Plan Note (Signed)
History of hyperlipidemia not on statin therapy with lipid profile performed 11/30/2019 revealing total pressure 141, LDL 79 HDL 45.

## 2020-03-05 NOTE — Assessment & Plan Note (Signed)
History of essential hypertension a blood pressure measured today at 142/68.  She is on amlodipin, and metoprolol.

## 2020-03-05 NOTE — Assessment & Plan Note (Signed)
History of palpitations improved with up titration of her metoprolol from 50 to 75 mg p.o. twice daily.

## 2020-03-26 ENCOUNTER — Other Ambulatory Visit: Payer: Self-pay | Admitting: Family Medicine

## 2020-03-26 DIAGNOSIS — I10 Essential (primary) hypertension: Secondary | ICD-10-CM

## 2020-06-03 ENCOUNTER — Other Ambulatory Visit: Payer: Self-pay | Admitting: Family Medicine

## 2020-06-03 DIAGNOSIS — I10 Essential (primary) hypertension: Secondary | ICD-10-CM

## 2020-06-11 ENCOUNTER — Ambulatory Visit (INDEPENDENT_AMBULATORY_CARE_PROVIDER_SITE_OTHER): Payer: Medicare HMO | Admitting: Family Medicine

## 2020-06-11 ENCOUNTER — Other Ambulatory Visit: Payer: Self-pay

## 2020-06-11 ENCOUNTER — Encounter: Payer: Self-pay | Admitting: Family Medicine

## 2020-06-11 VITALS — BP 138/70 | HR 77 | Ht 63.0 in | Wt 128.0 lb

## 2020-06-11 DIAGNOSIS — R319 Hematuria, unspecified: Secondary | ICD-10-CM | POA: Diagnosis not present

## 2020-06-11 DIAGNOSIS — J449 Chronic obstructive pulmonary disease, unspecified: Secondary | ICD-10-CM

## 2020-06-11 DIAGNOSIS — Z23 Encounter for immunization: Secondary | ICD-10-CM

## 2020-06-11 DIAGNOSIS — M5090 Cervical disc disorder, unspecified, unspecified cervical region: Secondary | ICD-10-CM

## 2020-06-11 DIAGNOSIS — I10 Essential (primary) hypertension: Secondary | ICD-10-CM | POA: Diagnosis not present

## 2020-06-11 DIAGNOSIS — N1831 Chronic kidney disease, stage 3a: Secondary | ICD-10-CM | POA: Diagnosis not present

## 2020-06-11 DIAGNOSIS — M25572 Pain in left ankle and joints of left foot: Secondary | ICD-10-CM | POA: Diagnosis not present

## 2020-06-11 DIAGNOSIS — H8110 Benign paroxysmal vertigo, unspecified ear: Secondary | ICD-10-CM

## 2020-06-11 LAB — POCT URINALYSIS DIP (CLINITEK)
Bilirubin, UA: NEGATIVE
Glucose, UA: NEGATIVE mg/dL
Ketones, POC UA: NEGATIVE mg/dL
Nitrite, UA: NEGATIVE
POC PROTEIN,UA: NEGATIVE
Spec Grav, UA: 1.02 (ref 1.010–1.025)
Urobilinogen, UA: 0.2 E.U./dL
pH, UA: 6 (ref 5.0–8.0)

## 2020-06-11 LAB — POCT UA - MICROALBUMIN
Albumin/Creatinine Ratio, Urine, POC: <30
Creatinine, POC: 100 mg/dL
Microalbumin Ur, POC: 10 mg/L

## 2020-06-11 NOTE — Assessment & Plan Note (Signed)
Following renal function every 6 months.  Due for updated labs.

## 2020-06-11 NOTE — Assessment & Plan Note (Signed)
Stable.  She reports she has not had any flares or exacerbations since I last saw her.  She says in fact this summer she did better than she usually does but she also stayed in a lot more because of her vertigo.

## 2020-06-11 NOTE — Assessment & Plan Note (Signed)
Pressure actually looks great here in the office today even though it sounds like was little elevated at home this morning but she was also in a lot of pain.  You for updated labs including a CMP.  Lipid is up-to-date from April.

## 2020-06-11 NOTE — Progress Notes (Signed)
Established Patient Office Visit  Subjective:  Patient ID: Erika Lynch, female    DOB: 02-Jan-1933  Age: 86 y.o. MRN: [id number]  CC:  Chief Complaint  Patient presents with  . Hypertension    HPI Erika Lynch presents for   Follow up of vertigo.  She still experiencing persistent symptoms with her vertigo similar to what she described in July.  We actually placed a home health referral to do vestibular rehab but she says she was never contacted.  Like the vertigo itself is a little bit better but just notices that her gait is still off she will still want to stagger to the left. She is actually had vertigo on and off for years but says it was severe enough that she actually called EMS on April 4 and was seen at the emergency department.  The lasted vestibular rehab in 2017 via home health.  She was given some Antivert pills this time and she says they are helpful they do take the edge off.  She says she is been waking up with it every single day it seems to get a little bit better by the evening.  She says she also feels like her left ear closes up when the air conditioning hits that she wonders if she could have some fluid in her ear that could be making her vertigo worse.  Denies any significant nausea and no vomiting.  Hypertension- Pt denies chest pain, SOB, dizziness, or heart palpitations.  Taking meds as directed w/o problems.  Denies medication side effects.  Her blood pressure this morning was a little high at home in the 818H with a diastolic in the 63J  Also complains of intermittent left ankle pain she says when it bothers her the will swell and turn red usually swells for a couple of days no more than 4 at the most.  She says she will get just a sharp intermittent pain she will usually place her foot brace on and that does seem to help.  She has never been diagnosed with gout.  Having a lot of pain in her posterior neck particularly on the left and going between her  shoulder blades its been there for quite some time but sometimes it really bothers her especially when she first gets up in the morning it can be so painful that she will actually shake in her hands she denies any numbness or tingling of her hands.   Past Medical History:  Diagnosis Date  . Cataract   . Dyskinesia of esophagus   . Hypertension   . Kidney stone   . Uterine cancer [facility])     Past Surgical History:  Procedure Laterality Date  . ABDOMINAL HYSTERECTOMY     Uterine cancer  . BACK SURGERY      Family History  Problem Relation Age of Onset  . Hypertension Father   . Hyperlipidemia Father   . Heart attack Father   . Stroke Mother   . Heart attack Brother   . Heart attack Brother   . Colon cancer Sister   . Breast cancer Other     Social History   Socioeconomic History  . Marital status: Widowed    Spouse name: Not on file  . Number of children: 4  . Years of education: 7th  . Highest education level: 7th grade  Occupational History  . Occupation: Retired.     Employer: RETIRED    Comment: [employer]  Tobacco Use  .  Smoking status: Former Research scientist (life sciences)  . Smokeless tobacco: Never Used  Vaping Use  . Vaping Use: Never used  Substance and Sexual Activity  . Alcohol use: No  . Drug use: No  . Sexual activity: Not Currently  Other Topics Concern  . Not on file  Social History Narrative   3 living children: [name], Danny.  Daily caffeine use. Regular exercise walks daily for 1 hour at a time.   Social Determinants of Health   Financial Resource Strain:   . Difficulty of Paying Living Expenses: Not on file  Food Insecurity:   . Worried About Charity fundraiser in the Last Year: Not on file  . Ran Out of Food in the Last Year: Not on file  Transportation Needs:   . Lack of Transportation (Medical): Not on file  . Lack of Transportation (Non-Medical): Not on file  Physical Activity:   . Days of Exercise per Week: Not on file  . Minutes of Exercise  per Session: Not on file  Stress:   . Feeling of Stress : Not on file  Social Connections:   . Frequency of Communication with Friends and Family: Not on file  . Frequency of Social Gatherings with Friends and Family: Not on file  . Attends Religious Services: Not on file  . Active Member of Clubs or Organizations: Not on file  . Attends Archivist Meetings: Not on file  . Marital Status: Not on file  Intimate Partner Violence:   . Fear of Current or Ex-Partner: Not on file  . Emotionally Abused: Not on file  . Physically Abused: Not on file  . Sexually Abused: Not on file    Outpatient Medications Prior to Visit  Medication Sig Dispense Refill  . albuterol (VENTOLIN HFA) 108 (90 Base) MCG/ACT inhaler INHALE 2 PUFFS INTO THE LUNGS EVERY 6 HOURS AS NEEDED FOR WHEEZING OR SHORTNESS OF BREATH. 18 g 1  . amLODipine (NORVASC) 5 MG tablet TAKE 1 TABLET EVERY DAY 90 tablet 0  . fluticasone (FLONASE) 50 MCG/ACT nasal spray USE 1 SPRAY INTO BOTH NOSTRILS DAILY 32 g 3  . lansoprazole (PREVACID) 15 MG capsule Take 1 capsule (15 mg total) by mouth every morning. 30 capsule 0  . meclizine (ANTIVERT) 25 MG tablet TAKE 1 TABLET (25 MG TOTAL) BY MOUTH DAILY AS NEEDED FOR DIZZINESS. 90 tablet 1  . metoprolol tartrate (LOPRESSOR) 50 MG tablet Take 1.5 tablets (75 mg total) by mouth 2 (two) times daily. 270 tablet 1  . omeprazole (PRILOSEC) 40 MG capsule TAKE 1 CAPSULE EVERY DAY 90 capsule 3  . STIOLTO RESPIMAT 2.5-2.5 MCG/ACT AERS INHALE 2 PUFFS INTO THE LUNGS DAILY. 12 g 3  . vitamin B-12 (CYANOCOBALAMIN) 50 MCG tablet Take 50 mcg by mouth daily.    . Vitamin D, Ergocalciferol, (DRISDOL) 1.25 MG (50000 UNIT) CAPS capsule Take 1 capsule (50,000 Units total) by mouth every 7 (seven) days. 12 capsule 0   No facility-administered medications prior to visit.    Allergies  Allergen Reactions  . Sulfa Antibiotics Rash  . Aspirin   . Cisapride   . Imipramine Hcl   . Mometasone Swelling     Throat swelling   . Penicillins Rash  . Sulfonamide Derivatives Rash    ROS Review of Systems    Objective:    Physical Exam Constitutional:      Appearance: She is well-developed.  HENT:     Head: Normocephalic and atraumatic.  Cardiovascular:  Rate and Rhythm: Normal rate and regular rhythm.     Heart sounds: Normal heart sounds.  Pulmonary:     Effort: Pulmonary effort is normal.     Breath sounds: Normal breath sounds.  Skin:    General: Skin is warm and dry.  Neurological:     Mental Status: She is alert and oriented to person, place, and time.  Psychiatric:        Behavior: Behavior normal.     BP 138/70   Pulse 77   Ht 5\' 3"  (1.6 m)   Wt 128 lb (58.1 kg)   SpO2 98%   BMI 22.67 kg/m  Wt Readings from Last 3 Encounters:  06/11/20 128 lb (58.1 kg)  03/05/20 129 lb 3.2 oz (58.6 kg)  02/09/20 130 lb (59 kg)     There are no preventive care reminders to display for this patient.  There are no preventive care reminders to display for this patient.  Lab Results  Component Value Date   TSH 3.15 10/23/2019   Lab Results  Component Value Date   WBC 12.2 (H) 10/23/2019   HGB 15.4 10/23/2019   HCT 45.5 (H) 10/23/2019   MCV 93.4 10/23/2019   PLT 313 10/23/2019   Lab Results  Component Value Date   NA 139 10/23/2019   K 4.6 10/23/2019   CO2 27 10/23/2019   GLUCOSE 107 10/23/2019   BUN 14 10/23/2019   CREATININE 1.18 (H) 10/23/2019   BILITOT 0.3 11/30/2019   ALKPHOS 74 11/30/2019   AST 17 11/30/2019   ALT 14 11/30/2019   PROT 7.0 11/30/2019   ALBUMIN 4.2 11/30/2019   CALCIUM 10.5 (H) 10/23/2019   Lab Results  Component Value Date   CHOL 141 11/30/2019   Lab Results  Component Value Date   HDL 45 11/30/2019   Lab Results  Component Value Date   LDLCALC 79 11/30/2019   Lab Results  Component Value Date   TRIG 91 11/30/2019   Lab Results  Component Value Date   CHOLHDL 3.1 11/30/2019   Lab Results  Component Value Date    HGBA1C 5.8 (A) 02/09/2020      Assessment & Plan:   Problem List Items Addressed This Visit      Cardiovascular and Mediastinum   Essential hypertension - Primary    Pressure actually looks great here in the office today even though it sounds like was little elevated at home this morning but she was also in a lot of pain.  You for updated labs including a CMP.  Lipid is up-to-date from April.      Relevant Orders   Uric acid   COMPLETE METABOLIC PANEL WITH GFR   TSH     Respiratory   COPD with asthma ([place])    Stable.  She reports she has not had any flares or exacerbations since I last saw her.  She says in fact this summer she did better than she usually does but she also stayed in a lot more because of her vertigo.        Nervous and Auditory   BPPV (benign paroxysmal positional vertigo)    We had actually placed a referral for home health to do vestibular rehab on her in July.  We did confirm that the notes were faxed but she never heard back from them.  We will see what is going on and why she was never contacted and then call her daughter back to let her know their  phone number in case they have not contacted her within about 5 business days.        Musculoskeletal and Integument   Cervical disc disease    She would really benefit from formal physical therapy hopefully we can get this included with her vestibular rehab to see if we can at least provide her some decrease in her pain.        Genitourinary   CKD stage G3a/A1, GFR 45-59 and albumin creatinine ratio <30 mg/g (HCC)    Following renal function every 6 months.  Due for updated labs.      Relevant Orders   Uric acid   COMPLETE METABOLIC PANEL WITH GFR   TSH   POCT URINALYSIS DIP (CLINITEK) (Completed)   POCT UA - Microalbumin (Completed)    Other Visit Diagnoses    Acute left ankle pain       Relevant Orders   Uric acid   COMPLETE METABOLIC PANEL WITH GFR   TSH   Need for immunization against  influenza       Relevant Orders   Flu Vaccine QUAD High Dose(Fluad) (Completed)   Hematuria, unspecified type       Relevant Orders   Urine Culture     Left ankle redness and swelling with intermittent pain it sounds like it is flaring the most like a gout attack.  Recommend that we check a uric acid level.  Exam is normal today.  No orders of the defined types were placed in this encounter.   Follow-up: Return in about 4 months (around 10/09/2020) for Hypertension.    Beatrice Lecher, MD

## 2020-06-11 NOTE — Assessment & Plan Note (Signed)
She would really benefit from formal physical therapy hopefully we can get this included with her vestibular rehab to see if we can at least provide her some decrease in her pain.

## 2020-06-11 NOTE — Assessment & Plan Note (Signed)
We had actually placed a referral for home health to do vestibular rehab on her in July.  We did confirm that the notes were faxed but she never heard back from them.  We will see what is going on and why she was never contacted and then call her daughter back to let her know their phone number in case they have not contacted her within about 5 business days.

## 2020-06-12 LAB — COMPLETE METABOLIC PANEL WITH GFR
AG Ratio: 1.7 (calc) (ref 1.0–2.5)
ALT: 8 U/L (ref 6–29)
AST: 14 U/L (ref 10–35)
Albumin: 4.3 g/dL (ref 3.6–5.1)
Alkaline phosphatase (APISO): 45 U/L (ref 37–153)
BUN/Creatinine Ratio: 15 (calc) (ref 6–22)
BUN: 18 mg/dL (ref 7–25)
CO2: 25 mmol/L (ref 20–32)
Calcium: 10.2 mg/dL (ref 8.6–10.4)
Chloride: 104 mmol/L (ref 98–110)
Creat: 1.2 mg/dL — ABNORMAL HIGH (ref 0.60–0.88)
GFR, Est African American: 48 mL/min/{1.73_m2} — ABNORMAL LOW (ref >=60)
GFR, Est Non African American: 41 mL/min/{1.73_m2} — ABNORMAL LOW (ref >=60)
Globulin: 2.5 g/dL (calc) (ref 1.9–3.7)
Glucose, Bld: 122 mg/dL — ABNORMAL HIGH (ref 65–99)
Potassium: 4.9 mmol/L (ref 3.5–5.3)
Sodium: 139 mmol/L (ref 135–146)
Total Bilirubin: 0.5 mg/dL (ref 0.2–1.2)
Total Protein: 6.8 g/dL (ref 6.1–8.1)

## 2020-06-12 LAB — TSH: TSH: 2.19 mIU/L (ref 0.40–4.50)

## 2020-06-12 LAB — URINE CULTURE
MICRO NUMBER:: 11180786
Result:: NO GROWTH
SPECIMEN QUALITY:: ADEQUATE

## 2020-06-12 LAB — URIC ACID: Uric Acid, Serum: 5.7 mg/dL (ref 2.5–7.0)

## 2020-06-14 ENCOUNTER — Other Ambulatory Visit: Payer: Self-pay

## 2020-06-14 DIAGNOSIS — N1831 Chronic kidney disease, stage 3a: Secondary | ICD-10-CM

## 2020-06-17 DIAGNOSIS — M5090 Cervical disc disorder, unspecified, unspecified cervical region: Secondary | ICD-10-CM | POA: Diagnosis not present

## 2020-06-17 DIAGNOSIS — K219 Gastro-esophageal reflux disease without esophagitis: Secondary | ICD-10-CM | POA: Diagnosis not present

## 2020-06-17 DIAGNOSIS — J449 Chronic obstructive pulmonary disease, unspecified: Secondary | ICD-10-CM | POA: Diagnosis not present

## 2020-06-17 DIAGNOSIS — M48061 Spinal stenosis, lumbar region without neurogenic claudication: Secondary | ICD-10-CM | POA: Diagnosis not present

## 2020-06-17 DIAGNOSIS — M542 Cervicalgia: Secondary | ICD-10-CM | POA: Diagnosis not present

## 2020-06-17 DIAGNOSIS — I129 Hypertensive chronic kidney disease with stage 1 through stage 4 chronic kidney disease, or unspecified chronic kidney disease: Secondary | ICD-10-CM | POA: Diagnosis not present

## 2020-06-17 DIAGNOSIS — N1831 Chronic kidney disease, stage 3a: Secondary | ICD-10-CM | POA: Diagnosis not present

## 2020-06-17 DIAGNOSIS — M25572 Pain in left ankle and joints of left foot: Secondary | ICD-10-CM | POA: Diagnosis not present

## 2020-06-17 DIAGNOSIS — H8113 Benign paroxysmal vertigo, bilateral: Secondary | ICD-10-CM | POA: Diagnosis not present

## 2020-06-18 DIAGNOSIS — H8113 Benign paroxysmal vertigo, bilateral: Secondary | ICD-10-CM | POA: Diagnosis not present

## 2020-06-18 DIAGNOSIS — M542 Cervicalgia: Secondary | ICD-10-CM | POA: Diagnosis not present

## 2020-06-18 DIAGNOSIS — I129 Hypertensive chronic kidney disease with stage 1 through stage 4 chronic kidney disease, or unspecified chronic kidney disease: Secondary | ICD-10-CM | POA: Diagnosis not present

## 2020-06-18 DIAGNOSIS — J449 Chronic obstructive pulmonary disease, unspecified: Secondary | ICD-10-CM | POA: Diagnosis not present

## 2020-06-18 DIAGNOSIS — K219 Gastro-esophageal reflux disease without esophagitis: Secondary | ICD-10-CM | POA: Diagnosis not present

## 2020-06-18 DIAGNOSIS — M48061 Spinal stenosis, lumbar region without neurogenic claudication: Secondary | ICD-10-CM | POA: Diagnosis not present

## 2020-06-18 DIAGNOSIS — N1831 Chronic kidney disease, stage 3a: Secondary | ICD-10-CM | POA: Diagnosis not present

## 2020-06-18 DIAGNOSIS — M5090 Cervical disc disorder, unspecified, unspecified cervical region: Secondary | ICD-10-CM | POA: Diagnosis not present

## 2020-06-18 DIAGNOSIS — M25572 Pain in left ankle and joints of left foot: Secondary | ICD-10-CM | POA: Diagnosis not present

## 2020-06-19 ENCOUNTER — Telehealth: Payer: Self-pay

## 2020-06-19 NOTE — Telephone Encounter (Signed)
Agree with documentation as above.   Mardella Nuckles, MD  

## 2020-06-19 NOTE — Telephone Encounter (Signed)
[facility] called for verbal orders for PT for vertigo. Orders given.

## 2020-06-20 DIAGNOSIS — J449 Chronic obstructive pulmonary disease, unspecified: Secondary | ICD-10-CM | POA: Diagnosis not present

## 2020-06-20 DIAGNOSIS — M48061 Spinal stenosis, lumbar region without neurogenic claudication: Secondary | ICD-10-CM | POA: Diagnosis not present

## 2020-06-20 DIAGNOSIS — M542 Cervicalgia: Secondary | ICD-10-CM | POA: Diagnosis not present

## 2020-06-20 DIAGNOSIS — H8113 Benign paroxysmal vertigo, bilateral: Secondary | ICD-10-CM | POA: Diagnosis not present

## 2020-06-20 DIAGNOSIS — I129 Hypertensive chronic kidney disease with stage 1 through stage 4 chronic kidney disease, or unspecified chronic kidney disease: Secondary | ICD-10-CM | POA: Diagnosis not present

## 2020-06-20 DIAGNOSIS — M25572 Pain in left ankle and joints of left foot: Secondary | ICD-10-CM | POA: Diagnosis not present

## 2020-06-20 DIAGNOSIS — N1831 Chronic kidney disease, stage 3a: Secondary | ICD-10-CM | POA: Diagnosis not present

## 2020-06-20 DIAGNOSIS — K219 Gastro-esophageal reflux disease without esophagitis: Secondary | ICD-10-CM | POA: Diagnosis not present

## 2020-06-20 DIAGNOSIS — M5090 Cervical disc disorder, unspecified, unspecified cervical region: Secondary | ICD-10-CM | POA: Diagnosis not present

## 2020-06-24 DIAGNOSIS — M542 Cervicalgia: Secondary | ICD-10-CM | POA: Diagnosis not present

## 2020-06-24 DIAGNOSIS — K219 Gastro-esophageal reflux disease without esophagitis: Secondary | ICD-10-CM | POA: Diagnosis not present

## 2020-06-24 DIAGNOSIS — M25572 Pain in left ankle and joints of left foot: Secondary | ICD-10-CM | POA: Diagnosis not present

## 2020-06-24 DIAGNOSIS — M5090 Cervical disc disorder, unspecified, unspecified cervical region: Secondary | ICD-10-CM | POA: Diagnosis not present

## 2020-06-24 DIAGNOSIS — H8113 Benign paroxysmal vertigo, bilateral: Secondary | ICD-10-CM | POA: Diagnosis not present

## 2020-06-24 DIAGNOSIS — J449 Chronic obstructive pulmonary disease, unspecified: Secondary | ICD-10-CM | POA: Diagnosis not present

## 2020-06-24 DIAGNOSIS — M48061 Spinal stenosis, lumbar region without neurogenic claudication: Secondary | ICD-10-CM | POA: Diagnosis not present

## 2020-06-24 DIAGNOSIS — N1831 Chronic kidney disease, stage 3a: Secondary | ICD-10-CM | POA: Diagnosis not present

## 2020-06-24 DIAGNOSIS — I129 Hypertensive chronic kidney disease with stage 1 through stage 4 chronic kidney disease, or unspecified chronic kidney disease: Secondary | ICD-10-CM | POA: Diagnosis not present

## 2020-06-25 DIAGNOSIS — J449 Chronic obstructive pulmonary disease, unspecified: Secondary | ICD-10-CM | POA: Diagnosis not present

## 2020-06-25 DIAGNOSIS — N1831 Chronic kidney disease, stage 3a: Secondary | ICD-10-CM | POA: Diagnosis not present

## 2020-06-25 DIAGNOSIS — I129 Hypertensive chronic kidney disease with stage 1 through stage 4 chronic kidney disease, or unspecified chronic kidney disease: Secondary | ICD-10-CM | POA: Diagnosis not present

## 2020-06-25 DIAGNOSIS — M25572 Pain in left ankle and joints of left foot: Secondary | ICD-10-CM | POA: Diagnosis not present

## 2020-06-25 DIAGNOSIS — M48061 Spinal stenosis, lumbar region without neurogenic claudication: Secondary | ICD-10-CM | POA: Diagnosis not present

## 2020-06-25 DIAGNOSIS — M5090 Cervical disc disorder, unspecified, unspecified cervical region: Secondary | ICD-10-CM | POA: Diagnosis not present

## 2020-06-25 DIAGNOSIS — H8113 Benign paroxysmal vertigo, bilateral: Secondary | ICD-10-CM | POA: Diagnosis not present

## 2020-06-25 DIAGNOSIS — M542 Cervicalgia: Secondary | ICD-10-CM | POA: Diagnosis not present

## 2020-06-25 DIAGNOSIS — K219 Gastro-esophageal reflux disease without esophagitis: Secondary | ICD-10-CM | POA: Diagnosis not present

## 2020-06-28 ENCOUNTER — Other Ambulatory Visit: Payer: Self-pay | Admitting: Family Medicine

## 2020-06-28 DIAGNOSIS — M25572 Pain in left ankle and joints of left foot: Secondary | ICD-10-CM | POA: Diagnosis not present

## 2020-06-28 DIAGNOSIS — J449 Chronic obstructive pulmonary disease, unspecified: Secondary | ICD-10-CM | POA: Diagnosis not present

## 2020-06-28 DIAGNOSIS — N1831 Chronic kidney disease, stage 3a: Secondary | ICD-10-CM | POA: Diagnosis not present

## 2020-06-28 DIAGNOSIS — I129 Hypertensive chronic kidney disease with stage 1 through stage 4 chronic kidney disease, or unspecified chronic kidney disease: Secondary | ICD-10-CM | POA: Diagnosis not present

## 2020-06-28 DIAGNOSIS — H8113 Benign paroxysmal vertigo, bilateral: Secondary | ICD-10-CM | POA: Diagnosis not present

## 2020-06-28 DIAGNOSIS — M48061 Spinal stenosis, lumbar region without neurogenic claudication: Secondary | ICD-10-CM | POA: Diagnosis not present

## 2020-06-28 DIAGNOSIS — M542 Cervicalgia: Secondary | ICD-10-CM | POA: Diagnosis not present

## 2020-06-28 DIAGNOSIS — K219 Gastro-esophageal reflux disease without esophagitis: Secondary | ICD-10-CM | POA: Diagnosis not present

## 2020-06-28 DIAGNOSIS — M5090 Cervical disc disorder, unspecified, unspecified cervical region: Secondary | ICD-10-CM | POA: Diagnosis not present

## 2020-07-02 DIAGNOSIS — H8113 Benign paroxysmal vertigo, bilateral: Secondary | ICD-10-CM | POA: Diagnosis not present

## 2020-07-02 DIAGNOSIS — M542 Cervicalgia: Secondary | ICD-10-CM | POA: Diagnosis not present

## 2020-07-02 DIAGNOSIS — K219 Gastro-esophageal reflux disease without esophagitis: Secondary | ICD-10-CM | POA: Diagnosis not present

## 2020-07-02 DIAGNOSIS — M5090 Cervical disc disorder, unspecified, unspecified cervical region: Secondary | ICD-10-CM | POA: Diagnosis not present

## 2020-07-02 DIAGNOSIS — M48061 Spinal stenosis, lumbar region without neurogenic claudication: Secondary | ICD-10-CM | POA: Diagnosis not present

## 2020-07-02 DIAGNOSIS — I129 Hypertensive chronic kidney disease with stage 1 through stage 4 chronic kidney disease, or unspecified chronic kidney disease: Secondary | ICD-10-CM | POA: Diagnosis not present

## 2020-07-02 DIAGNOSIS — N1831 Chronic kidney disease, stage 3a: Secondary | ICD-10-CM | POA: Diagnosis not present

## 2020-07-02 DIAGNOSIS — J449 Chronic obstructive pulmonary disease, unspecified: Secondary | ICD-10-CM | POA: Diagnosis not present

## 2020-07-02 DIAGNOSIS — M25572 Pain in left ankle and joints of left foot: Secondary | ICD-10-CM | POA: Diagnosis not present

## 2020-07-03 DIAGNOSIS — M542 Cervicalgia: Secondary | ICD-10-CM | POA: Diagnosis not present

## 2020-07-03 DIAGNOSIS — K219 Gastro-esophageal reflux disease without esophagitis: Secondary | ICD-10-CM | POA: Diagnosis not present

## 2020-07-03 DIAGNOSIS — J449 Chronic obstructive pulmonary disease, unspecified: Secondary | ICD-10-CM | POA: Diagnosis not present

## 2020-07-03 DIAGNOSIS — M25572 Pain in left ankle and joints of left foot: Secondary | ICD-10-CM | POA: Diagnosis not present

## 2020-07-03 DIAGNOSIS — H8113 Benign paroxysmal vertigo, bilateral: Secondary | ICD-10-CM | POA: Diagnosis not present

## 2020-07-03 DIAGNOSIS — N1831 Chronic kidney disease, stage 3a: Secondary | ICD-10-CM | POA: Diagnosis not present

## 2020-07-03 DIAGNOSIS — M48061 Spinal stenosis, lumbar region without neurogenic claudication: Secondary | ICD-10-CM | POA: Diagnosis not present

## 2020-07-03 DIAGNOSIS — M5090 Cervical disc disorder, unspecified, unspecified cervical region: Secondary | ICD-10-CM | POA: Diagnosis not present

## 2020-07-03 DIAGNOSIS — I129 Hypertensive chronic kidney disease with stage 1 through stage 4 chronic kidney disease, or unspecified chronic kidney disease: Secondary | ICD-10-CM | POA: Diagnosis not present

## 2020-07-08 DIAGNOSIS — I129 Hypertensive chronic kidney disease with stage 1 through stage 4 chronic kidney disease, or unspecified chronic kidney disease: Secondary | ICD-10-CM

## 2020-07-08 DIAGNOSIS — N1831 Chronic kidney disease, stage 3a: Secondary | ICD-10-CM

## 2020-07-09 ENCOUNTER — Telehealth: Payer: Self-pay

## 2020-07-09 MED ORDER — ONDANSETRON 4 MG PO TBDP: 4.0000 mg | ORAL_TABLET | Freq: Three times a day (TID) | ORAL | 0 refills | Status: DC | PRN

## 2020-07-09 NOTE — Telephone Encounter (Signed)
Erika Lynch with [facility] called and left a message stating patient declined appointment today due to nausea.

## 2020-07-09 NOTE — Telephone Encounter (Signed)
Please call pt and see if she needs any nausea medication

## 2020-07-09 NOTE — Telephone Encounter (Signed)
She would like medication for nausea.

## 2020-07-09 NOTE — Telephone Encounter (Signed)
zofran sent to [facility].

## 2020-07-16 DIAGNOSIS — H8113 Benign paroxysmal vertigo, bilateral: Secondary | ICD-10-CM | POA: Diagnosis not present

## 2020-07-16 DIAGNOSIS — N1831 Chronic kidney disease, stage 3a: Secondary | ICD-10-CM | POA: Diagnosis not present

## 2020-07-16 DIAGNOSIS — J449 Chronic obstructive pulmonary disease, unspecified: Secondary | ICD-10-CM | POA: Diagnosis not present

## 2020-07-16 DIAGNOSIS — M542 Cervicalgia: Secondary | ICD-10-CM | POA: Diagnosis not present

## 2020-07-16 DIAGNOSIS — M5090 Cervical disc disorder, unspecified, unspecified cervical region: Secondary | ICD-10-CM | POA: Diagnosis not present

## 2020-07-16 DIAGNOSIS — M48061 Spinal stenosis, lumbar region without neurogenic claudication: Secondary | ICD-10-CM | POA: Diagnosis not present

## 2020-07-16 DIAGNOSIS — R42 Dizziness and giddiness: Secondary | ICD-10-CM

## 2020-07-16 DIAGNOSIS — K219 Gastro-esophageal reflux disease without esophagitis: Secondary | ICD-10-CM | POA: Diagnosis not present

## 2020-07-16 DIAGNOSIS — M25572 Pain in left ankle and joints of left foot: Secondary | ICD-10-CM | POA: Diagnosis not present

## 2020-07-16 DIAGNOSIS — I129 Hypertensive chronic kidney disease with stage 1 through stage 4 chronic kidney disease, or unspecified chronic kidney disease: Secondary | ICD-10-CM | POA: Diagnosis not present

## 2020-07-16 HISTORY — DX: Dizziness and giddiness: R42

## 2020-07-18 DIAGNOSIS — I129 Hypertensive chronic kidney disease with stage 1 through stage 4 chronic kidney disease, or unspecified chronic kidney disease: Secondary | ICD-10-CM | POA: Diagnosis not present

## 2020-07-18 DIAGNOSIS — H8113 Benign paroxysmal vertigo, bilateral: Secondary | ICD-10-CM | POA: Diagnosis not present

## 2020-07-18 DIAGNOSIS — K219 Gastro-esophageal reflux disease without esophagitis: Secondary | ICD-10-CM | POA: Diagnosis not present

## 2020-07-18 DIAGNOSIS — M48061 Spinal stenosis, lumbar region without neurogenic claudication: Secondary | ICD-10-CM | POA: Diagnosis not present

## 2020-07-18 DIAGNOSIS — N1831 Chronic kidney disease, stage 3a: Secondary | ICD-10-CM | POA: Diagnosis not present

## 2020-07-18 DIAGNOSIS — J449 Chronic obstructive pulmonary disease, unspecified: Secondary | ICD-10-CM | POA: Diagnosis not present

## 2020-07-18 DIAGNOSIS — M25572 Pain in left ankle and joints of left foot: Secondary | ICD-10-CM | POA: Diagnosis not present

## 2020-07-18 DIAGNOSIS — M542 Cervicalgia: Secondary | ICD-10-CM | POA: Diagnosis not present

## 2020-07-18 DIAGNOSIS — M5090 Cervical disc disorder, unspecified, unspecified cervical region: Secondary | ICD-10-CM | POA: Diagnosis not present

## 2020-07-22 DIAGNOSIS — J449 Chronic obstructive pulmonary disease, unspecified: Secondary | ICD-10-CM | POA: Diagnosis not present

## 2020-07-22 DIAGNOSIS — M5090 Cervical disc disorder, unspecified, unspecified cervical region: Secondary | ICD-10-CM | POA: Diagnosis not present

## 2020-07-22 DIAGNOSIS — K219 Gastro-esophageal reflux disease without esophagitis: Secondary | ICD-10-CM | POA: Diagnosis not present

## 2020-07-22 DIAGNOSIS — M542 Cervicalgia: Secondary | ICD-10-CM | POA: Diagnosis not present

## 2020-07-22 DIAGNOSIS — M25572 Pain in left ankle and joints of left foot: Secondary | ICD-10-CM | POA: Diagnosis not present

## 2020-07-22 DIAGNOSIS — H8113 Benign paroxysmal vertigo, bilateral: Secondary | ICD-10-CM | POA: Diagnosis not present

## 2020-07-22 DIAGNOSIS — M48061 Spinal stenosis, lumbar region without neurogenic claudication: Secondary | ICD-10-CM | POA: Diagnosis not present

## 2020-07-22 DIAGNOSIS — N1831 Chronic kidney disease, stage 3a: Secondary | ICD-10-CM | POA: Diagnosis not present

## 2020-07-22 DIAGNOSIS — I129 Hypertensive chronic kidney disease with stage 1 through stage 4 chronic kidney disease, or unspecified chronic kidney disease: Secondary | ICD-10-CM | POA: Diagnosis not present

## 2020-07-30 DIAGNOSIS — K219 Gastro-esophageal reflux disease without esophagitis: Secondary | ICD-10-CM | POA: Diagnosis not present

## 2020-07-30 DIAGNOSIS — J449 Chronic obstructive pulmonary disease, unspecified: Secondary | ICD-10-CM | POA: Diagnosis not present

## 2020-07-30 DIAGNOSIS — M48061 Spinal stenosis, lumbar region without neurogenic claudication: Secondary | ICD-10-CM | POA: Diagnosis not present

## 2020-07-30 DIAGNOSIS — I129 Hypertensive chronic kidney disease with stage 1 through stage 4 chronic kidney disease, or unspecified chronic kidney disease: Secondary | ICD-10-CM | POA: Diagnosis not present

## 2020-07-30 DIAGNOSIS — M542 Cervicalgia: Secondary | ICD-10-CM | POA: Diagnosis not present

## 2020-07-30 DIAGNOSIS — M25572 Pain in left ankle and joints of left foot: Secondary | ICD-10-CM | POA: Diagnosis not present

## 2020-07-30 DIAGNOSIS — H8113 Benign paroxysmal vertigo, bilateral: Secondary | ICD-10-CM | POA: Diagnosis not present

## 2020-07-30 DIAGNOSIS — M5090 Cervical disc disorder, unspecified, unspecified cervical region: Secondary | ICD-10-CM | POA: Diagnosis not present

## 2020-07-30 DIAGNOSIS — N1831 Chronic kidney disease, stage 3a: Secondary | ICD-10-CM | POA: Diagnosis not present

## 2020-07-31 ENCOUNTER — Other Ambulatory Visit: Payer: Self-pay | Admitting: Family Medicine

## 2020-08-06 DIAGNOSIS — N1831 Chronic kidney disease, stage 3a: Secondary | ICD-10-CM | POA: Diagnosis not present

## 2020-08-06 DIAGNOSIS — J449 Chronic obstructive pulmonary disease, unspecified: Secondary | ICD-10-CM | POA: Diagnosis not present

## 2020-08-06 DIAGNOSIS — M48061 Spinal stenosis, lumbar region without neurogenic claudication: Secondary | ICD-10-CM | POA: Diagnosis not present

## 2020-08-06 DIAGNOSIS — H8113 Benign paroxysmal vertigo, bilateral: Secondary | ICD-10-CM | POA: Diagnosis not present

## 2020-08-06 DIAGNOSIS — M25572 Pain in left ankle and joints of left foot: Secondary | ICD-10-CM | POA: Diagnosis not present

## 2020-08-06 DIAGNOSIS — M542 Cervicalgia: Secondary | ICD-10-CM | POA: Diagnosis not present

## 2020-08-06 DIAGNOSIS — I129 Hypertensive chronic kidney disease with stage 1 through stage 4 chronic kidney disease, or unspecified chronic kidney disease: Secondary | ICD-10-CM | POA: Diagnosis not present

## 2020-08-06 DIAGNOSIS — M5090 Cervical disc disorder, unspecified, unspecified cervical region: Secondary | ICD-10-CM | POA: Diagnosis not present

## 2020-08-06 DIAGNOSIS — K219 Gastro-esophageal reflux disease without esophagitis: Secondary | ICD-10-CM | POA: Diagnosis not present

## 2020-08-07 DIAGNOSIS — H903 Sensorineural hearing loss, bilateral: Secondary | ICD-10-CM | POA: Diagnosis not present

## 2020-08-07 DIAGNOSIS — H8112 Benign paroxysmal vertigo, left ear: Secondary | ICD-10-CM | POA: Diagnosis not present

## 2020-08-13 DIAGNOSIS — J449 Chronic obstructive pulmonary disease, unspecified: Secondary | ICD-10-CM | POA: Diagnosis not present

## 2020-08-13 DIAGNOSIS — N1831 Chronic kidney disease, stage 3a: Secondary | ICD-10-CM | POA: Diagnosis not present

## 2020-08-13 DIAGNOSIS — M5090 Cervical disc disorder, unspecified, unspecified cervical region: Secondary | ICD-10-CM | POA: Diagnosis not present

## 2020-08-13 DIAGNOSIS — M48061 Spinal stenosis, lumbar region without neurogenic claudication: Secondary | ICD-10-CM | POA: Diagnosis not present

## 2020-08-13 DIAGNOSIS — M25572 Pain in left ankle and joints of left foot: Secondary | ICD-10-CM | POA: Diagnosis not present

## 2020-08-13 DIAGNOSIS — H8113 Benign paroxysmal vertigo, bilateral: Secondary | ICD-10-CM | POA: Diagnosis not present

## 2020-08-13 DIAGNOSIS — I129 Hypertensive chronic kidney disease with stage 1 through stage 4 chronic kidney disease, or unspecified chronic kidney disease: Secondary | ICD-10-CM | POA: Diagnosis not present

## 2020-08-13 DIAGNOSIS — M542 Cervicalgia: Secondary | ICD-10-CM | POA: Diagnosis not present

## 2020-08-13 DIAGNOSIS — K219 Gastro-esophageal reflux disease without esophagitis: Secondary | ICD-10-CM | POA: Diagnosis not present

## 2020-08-16 ENCOUNTER — Other Ambulatory Visit: Payer: Self-pay | Admitting: Family Medicine

## 2020-08-16 DIAGNOSIS — I10 Essential (primary) hypertension: Secondary | ICD-10-CM

## 2020-08-19 ENCOUNTER — Ambulatory Visit (INDEPENDENT_AMBULATORY_CARE_PROVIDER_SITE_OTHER): Payer: Medicare HMO | Admitting: Family Medicine

## 2020-08-19 DIAGNOSIS — Z0000 Encounter for general adult medical examination without abnormal findings: Secondary | ICD-10-CM | POA: Diagnosis not present

## 2020-08-19 NOTE — Patient Instructions (Addendum)
Health Maintenance, Female Adopting a healthy lifestyle and getting preventive care are important in promoting health and wellness. Ask your health care provider about:  The right schedule for you to have regular tests and exams.  Things you can do on your own to prevent diseases and keep yourself healthy. What should I know about diet, weight, and exercise? Eat a healthy diet  Eat a diet that includes plenty of vegetables, fruits, low-fat dairy products, and lean protein.  Do not eat a lot of foods that are high in solid fats, added sugars, or sodium.   Maintain a healthy weight Body mass index (BMI) is used to identify weight problems. It estimates body fat based on height and weight. Your health care provider can help determine your BMI and help you achieve or maintain a healthy weight. Get regular exercise Get regular exercise. This is one of the most important things you can do for your health. Most adults should:  Exercise for at least 150 minutes each week. The exercise should increase your heart rate and make you sweat (moderate-intensity exercise).  Do strengthening exercises at least twice a week. This is in addition to the moderate-intensity exercise.  Spend less time sitting. Even light physical activity can be beneficial. Watch cholesterol and blood lipids Have your blood tested for lipids and cholesterol at 85 years of age, then have this test every 5 years. Have your cholesterol levels checked more often if:  Your lipid or cholesterol levels are high.  You are older than 85 years of age.  You are at high risk for heart disease. What should I know about cancer screening? Depending on your health history and family history, you may need to have cancer screening at various ages. This may include screening for:  Breast cancer.  Cervical cancer.  Colorectal cancer.  Skin cancer.  Lung cancer. What should I know about heart disease, diabetes, and high blood  pressure? Blood pressure and heart disease  High blood pressure causes heart disease and increases the risk of stroke. This is more likely to develop in people who have high blood pressure readings, are of African descent, or are overweight.  Have your blood pressure checked: ? Every 3-5 years if you are 85-85 years of age. ? Every year if you are 85 years old or older. Diabetes Have regular diabetes screenings. This checks your fasting blood sugar level. Have the screening done:  Once every three years after age 85 if you are at a normal weight and have a low risk for diabetes.  More often and at a younger age if you are overweight or have a high risk for diabetes. What should I know about preventing infection? Hepatitis B If you have a higher risk for hepatitis B, you should be screened for this virus. Talk with your health care provider to find out if you are at risk for hepatitis B infection. Hepatitis C Testing is recommended for:  Everyone born from [birth year] through 1963.  Anyone with known risk factors for hepatitis C. Sexually transmitted infections (STIs)  Get screened for STIs, including gonorrhea and chlamydia, if: ? You are sexually active and are younger than 85 years of age. ? You are older than 85 years of age and your health care provider tells you that you are at risk for this type of infection. ? Your sexual activity has changed since you were last screened, and you are at increased risk for chlamydia or gonorrhea. Ask your health care  provider if you are at risk.  Ask your health care provider about whether you are at high risk for HIV. Your health care provider may recommend a prescription medicine to help prevent HIV infection. If you choose to take medicine to prevent HIV, you should first get tested for HIV. You should then be tested every 3 months for as long as you are taking the medicine. Pregnancy  If you are about to stop having your period (premenopausal) and  you may become pregnant, seek counseling before you get pregnant.  Take 400 to 800 micrograms (mcg) of folic acid every day if you become pregnant.  Ask for birth control (contraception) if you want to prevent pregnancy. Osteoporosis and menopause Osteoporosis is a disease in which the bones lose minerals and strength with aging. This can result in bone fractures. If you are 85 years old or older, or if you are at risk for osteoporosis and fractures, ask your health care provider if you should:  Be screened for bone loss.  Take a calcium or vitamin D supplement to lower your risk of fractures.  Be given hormone replacement therapy (HRT) to treat symptoms of menopause. Follow these instructions at home: Lifestyle  Do not use any products that contain nicotine or tobacco, such as cigarettes, e-cigarettes, and chewing tobacco. If you need help quitting, ask your health care provider.  Do not use street drugs.  Do not share needles.  Ask your health care provider for help if you need support or information about quitting drugs. Alcohol use  Do not drink alcohol if: ? Your health care provider tells you not to drink. ? You are pregnant, may be pregnant, or are planning to become pregnant.  If you drink alcohol: ? Limit how much you use to 0-1 drink a day. ? Limit intake if you are breastfeeding.  Be aware of how much alcohol is in your drink. In the [place] one drink equals one 12 oz bottle of beer (355 mL), one 5 oz glass of wine (148 mL), or one 1 oz glass of hard liquor (44 mL). General instructions  Schedule regular health, dental, and eye exams.  Stay current with your vaccines.  Tell your health care provider if: ? You often feel depressed. ? You have ever been abused or do not feel safe at home. Summary  Adopting a healthy lifestyle and getting preventive care are important in promoting health and wellness.  Follow your health care provider's instructions about healthy  diet, exercising, and getting tested or screened for diseases.  Follow your health care provider's instructions on monitoring your cholesterol and blood pressure. This information is not intended to replace advice given to you by your health care provider. Make sure you discuss any questions you have with your health care provider. Document Revised: 07/13/2018 Document Reviewed: 07/13/2018 Elsevier Patient Education  2021 [place] Maintenance Summary and Written Plan of Care  Erika Lynch ,  Thank you for allowing me to perform your Medicare Annual Wellness Visit and for your ongoing commitment to your health.   Health Maintenance & Immunization History Health Maintenance  Topic Date Due  . TETANUS/TDAP  10/22/2020 (Originally 12/05/2018)  . COVID-19 Vaccine (1) 02/08/2021 (Originally 01/28/1938)  . INFLUENZA VACCINE  Completed  . DEXA SCAN  Completed  . PNA vac Low Risk Adult  Completed   Immunization History  Administered Date(s) Administered  . Fluad Quad(high Dose 65+) 08/14/2019, 06/11/2020  .  Influenza Split 05/04/2012  . Influenza Whole 05/17/2008, 05/27/2009, 04/17/2010  . Influenza, High Dose Seasonal PF 04/15/2016, 03/25/2017, 08/08/2018  . Influenza-Unspecified 04/03/2013, 04/20/2015  . Pneumococcal Conjugate-13 05/13/2015  . Pneumococcal Polysaccharide-23 12/04/2008  . Td 12/04/2008  . Zoster 05/07/2010    These are the patient goals that we discussed: Goals Addressed              This Visit's Progress   .  Patient Stated (pt-stated)        08/19/2020 AWV Goal: Improved Nutrition/Diet  . Patient will verbalize understanding that diet plays an important role in overall health and that a poor diet is a risk factor for many chronic medical conditions.  . Over the next year, patient will improve self management of their diet by incorporating decreased sodium intake. . Patient will utilize available community  resources to help with food acquisition if needed (ex: food pantries, [address], etc) . Patient will work with nutrition specialist if a referral was made         This is a list of Health Maintenance Items that are overdue or due now: Tetanus Vaccine Covid Vaccine Shingrix vaccine (already had the zoster)  Orders/Referrals Placed Today: No orders of the defined types were placed in this encounter.   Follow-up Plan . Follow-up with Hali Marry, MD as planned . Schedule your appointment for your tetanus vaccine, covid vaccines and shingrix vaccine (already had the zoster).

## 2020-08-19 NOTE — Progress Notes (Addendum)
MEDICARE ANNUAL WELLNESS VISIT  08/19/2020  Telephone Visit Disclaimer This Medicare AWV was conducted by telephone due to national recommendations for restrictions regarding the COVID-19 Pandemic (e.g. social distancing).  I verified, using two identifiers, that I am speaking with Erika Lynch or their authorized healthcare agent. I discussed the limitations, risks, security, and privacy concerns of performing an evaluation and management service by telephone and the potential availability of an in-person appointment in the future. The patient expressed understanding and agreed to proceed.  Location of Patient: Home Location of Provider (nurse):  Provider Home  Subjective:    Erika Lynch is a 87 y.o. female patient of [name], Rene Kocher, MD who had a Medicare Annual Wellness Visit today via telephone. Erika Lynch is Retired and lives alone. she has 3 living children and one deceased. she reports that she is socially active and does interact with friends/family regularly. she is moderately physically active and enjoys puzzles and workbooks.  Patient Care Team: Hali Marry, MD as PCP - General  Advanced Directives 08/19/2020 08/14/2019 08/10/2018 11/23/2013  Does Patient Have a Medical Advance Directive? Yes Yes Yes Patient has advance directive, copy not in chart  Type of Advance Directive Living will [place];Living will [place];Living will [place];Living will  Does patient want to make changes to medical advance directive? No - Patient declined No - Patient declined No - Patient declined -  Copy of [place] in Chart? - Yes - validated most recent copy scanned in chart (See row information) Yes - validated most recent copy scanned in chart (See row information) Copy requested from family    Hospital Utilization Over the Past 12 Months: # of hospitalizations or ER visits: 1 # of surgeries:  0  Review of Systems    Patient reports that her overall health is better compared to last year.  History obtained from chart review and the patient  Patient Reported Readings (BP, Pulse, CBG, Weight, etc) none  Pain Assessment Pain : No/denies pain     Current Medications & Allergies (verified) Allergies as of 08/19/2020      Reactions   Other Other (See Comments)   Pollen-- sneezing   Sulfa Antibiotics Rash   Aspirin    Cisapride    Imipramine Hcl    Mometasone Swelling   Throat swelling   Penicillins Rash   Sulfonamide Derivatives Rash      Medication List       Accurate as of August 19, 2020  1:41 PM. If you have any questions, ask your nurse or doctor.        albuterol 108 (90 Base) MCG/ACT inhaler Commonly known as: VENTOLIN HFA INHALE 2 PUFFS INTO THE LUNGS EVERY 6 HOURS AS NEEDED FOR WHEEZING OR SHORTNESS OF BREATH.   amLODipine 5 MG tablet Commonly known as: NORVASC Take 1 tablet (5 mg total) by mouth daily. Labs for refills   fluticasone 50 MCG/ACT nasal spray Commonly known as: FLONASE USE 1 SPRAY INTO BOTH NOSTRILS DAILY   lansoprazole 15 MG capsule Commonly known as: Prevacid Take 1 capsule (15 mg total) by mouth every morning.   meclizine 25 MG tablet Commonly known as: ANTIVERT TAKE 1 TABLET (25 MG TOTAL) BY MOUTH DAILY AS NEEDED FOR DIZZINESS.   metoprolol tartrate 50 MG tablet Commonly known as: LOPRESSOR TAKE 1 AND 1/2 TABLETS TWICE DAILY   omeprazole 40 MG capsule Commonly known as: PRILOSEC TAKE 1 CAPSULE EVERY  DAY   ondansetron 4 MG disintegrating tablet Commonly known as: Zofran ODT Take 1 tablet (4 mg total) by mouth every 8 (eight) hours as needed for nausea or vomiting.   Stiolto Respimat 2.5-2.5 MCG/ACT Aers Generic drug: Tiotropium Bromide-Olodaterol INHALE 2 PUFFS INTO THE LUNGS DAILY.   vitamin B-12 50 MCG tablet Commonly known as: CYANOCOBALAMIN Take 50 mcg by mouth daily.   Vitamin D (Ergocalciferol) 1.25 MG  (50000 UNIT) Caps capsule Commonly known as: DRISDOL Take 1 capsule (50,000 Units total) by mouth every 7 (seven) days.       History (reviewed): Past Medical History:  Diagnosis Date  . Cataract   . Dyskinesia of esophagus   . Hypertension   . Kidney stone   . Uterine cancer ([place])   . Vertigo 07/16/2020   Past Surgical History:  Procedure Laterality Date  . ABDOMINAL HYSTERECTOMY     Uterine cancer  . BACK SURGERY    . CATARACT EXTRACTION, BILATERAL     Family History  Problem Relation Age of Onset  . Heart attack Father   . Stroke Father   . Heart attack Brother   . Heart attack Brother   . Colon cancer Sister   . Cancer Sister        lung  . Breast cancer Other    Social History   Socioeconomic History  . Marital status: Divorced    Spouse name: Not on file  . Number of children: 4  . Years of education: 8th   . Highest education level: 8th grade  Occupational History  . Occupation: Retired.     Employer: RETIRED    Comment: [employer]  Tobacco Use  . Smoking status: Current Some Day Smoker  . Smokeless tobacco: Never Used  . Tobacco comment: smokes cig . less than a half pack a week  Vaping Use  . Vaping Use: Never used  Substance and Sexual Activity  . Alcohol use: No  . Drug use: No  . Sexual activity: Not Currently  Other Topics Concern  . Not on file  Social History Narrative   3 living children: [name], Danny.  Daily caffeine use. Regular exercise everyday, walking and doing stretching. She looks to do puzzles, workbooks and adult coloring books.   Social Determinants of Health   Financial Resource Strain: Low Risk   . Difficulty of Paying Living Expenses: Not hard at all  Food Insecurity: No Food Insecurity  . Worried About Charity fundraiser in the Last Year: Never true  . Ran Out of Food in the Last Year: Never true  Transportation Needs: No Transportation Needs  . Lack of Transportation (Medical): No  . Lack of  Transportation (Non-Medical): No  Physical Activity: Sufficiently Active  . Days of Exercise per Week: 7 days  . Minutes of Exercise per Session: 30 min  Stress: No Stress Concern Present  . Feeling of Stress : Not at all  Social Connections: Socially Isolated  . Frequency of Communication with Friends and Family: Once a week  . Frequency of Social Gatherings with Friends and Family: Once a week  . Attends Religious Services: Never  . Active Member of Clubs or Organizations: No  . Attends Archivist Meetings: Never  . Marital Status: Divorced    Activities of Daily Living In your present state of health, do you have any difficulty performing the following activities: 08/19/2020  Hearing? Y  Comment waiting to get hearing aids  Vision? N  Difficulty concentrating or making decisions? N  Walking or climbing stairs? N  Dressing or bathing? N  Doing errands, shopping? N  Preparing Food and eating ? N  Using the Toilet? N  In the past six months, have you accidently leaked urine? N  Do you have problems with loss of bowel control? N  Managing your Medications? N  Managing your Finances? N  Housekeeping or managing your Housekeeping? N  Some recent data might be hidden    Patient Education/ Literacy How often do you need to have someone help you when you read instructions, pamphlets, or other written materials from your doctor or pharmacy?: 1 - Never What is the last grade level you completed in school?: 8th grade  Exercise Current Exercise Habits: Home exercise routine, Type of exercise: stretching, Time (Minutes): 30, Frequency (Times/Week): 7, Weekly Exercise (Minutes/Week): 210, Intensity: Mild, Exercise limited by: None identified (vertigo)  Diet Patient reports consuming 3 meals a day and 0 snack(s) a day Patient reports that her primary diet is: Low Sodium Patient reports that she does have regular access to food.   Depression Screen PHQ 2/9 Scores 08/19/2020  08/19/2020 08/14/2019 02/09/2019 08/10/2018 08/08/2018 01/31/2018  PHQ - 2 Score 0 0 0 0 0 0 0  PHQ- 9 Score 0 0 - - - - -     Fall Risk Fall Risk  08/19/2020 08/14/2019 02/09/2019 08/10/2018 02/11/2017  Falls in the past year? 0 0 0 0 No  Number falls in past yr: 0 0 0 - -  Injury with Fall? 0 0 0 - -  Risk for fall due to : No Fall Risks - - - -  Follow up Falls evaluation completed Falls prevention discussed - Falls prevention discussed -     Objective:  Erika Lynch seemed alert and oriented and she participated appropriately during our telephone visit.  Blood Pressure Weight BMI  BP Readings from Last 3 Encounters:  06/11/20 138/70  03/05/20 (!) 142/68  02/09/20 130/70   Wt Readings from Last 3 Encounters:  06/11/20 128 lb (58.1 kg)  03/05/20 129 lb 3.2 oz (58.6 kg)  02/09/20 130 lb (59 kg)   BMI Readings from Last 1 Encounters:  06/11/20 22.67 kg/m    *Unable to obtain current vital signs, weight, and BMI due to telephone visit type  Hearing/Vision  . Erika Lynch did not seem to have difficulty with hearing/understanding during the telephone conversation . Reports that she has had a formal eye exam by an eye care professional within the past year . Reports that she has had a formal hearing evaluation within the past year *Unable to fully assess hearing and vision during telephone visit type  Cognitive Function: 6CIT Screen 08/19/2020 08/14/2019 08/10/2018 02/11/2017  What Year? 0 points 0 points 0 points 0 points  What month? 0 points 0 points 0 points 0 points  What time? 0 points 0 points 0 points 0 points  Count back from 20 0 points 2 points 2 points 0 points  Months in reverse 0 points 2 points 0 points 2 points  Repeat phrase 2 points 0 points 0 points 6 points  Total Score 2 4 2 8    (Normal:0-7, Significant for Dysfunction: >8)  Normal Cognitive Function Screening: Yes   Immunization & Health Maintenance Record Immunization History  Administered Date(s) Administered  .  Fluad Quad(high Dose 65+) 08/14/2019, 06/11/2020  . Influenza Split 05/04/2012  . Influenza Whole 05/17/2008, 05/27/2009, 04/17/2010  . Influenza, High Dose Seasonal PF  04/15/2016, 03/25/2017, 08/08/2018  . Influenza-Unspecified 04/03/2013, 04/20/2015  . Pneumococcal Conjugate-13 05/13/2015  . Pneumococcal Polysaccharide-23 12/04/2008  . Td 12/04/2008  . Zoster 05/07/2010    Health Maintenance  Topic Date Due  . TETANUS/TDAP  10/22/2020 (Originally 12/05/2018)  . COVID-19 Vaccine (1) 02/08/2021 (Originally 01/28/1938)  . INFLUENZA VACCINE  Completed  . DEXA SCAN  Completed  . PNA vac Low Risk Adult  Completed       Assessment  This is a routine wellness examination for Erika Lynch.  Health Maintenance: Due or Overdue There are no preventive care reminders to display for this patient.  Erika Lynch does not need a referral for Community Assistance: Care Management:   no Social Work:    no Prescription Assistance:  no Nutrition/Diabetes Education:  no   Plan:  Personalized Goals Goals Addressed              This Visit's Progress   .  Patient Stated (pt-stated)        08/19/2020 AWV Goal: Improved Nutrition/Diet  . Patient will verbalize understanding that diet plays an important role in overall health and that a poor diet is a risk factor for many chronic medical conditions.  . Over the next year, patient will improve self management of their diet by incorporating decreased sodium intake. . Patient will utilize available community resources to help with food acquisition if needed (ex: food pantries, [address], etc) . Patient will work with nutrition specialist if a referral was made       Personalized Health Maintenance & Screening Recommendations  Td vaccine, Shingrix and covid vaccine  Lung Cancer Screening Recommended: no (Low Dose CT Chest recommended if Age 26-80 years, 30 pack-year currently smoking OR have quit w/in past 15 years) Hepatitis C Screening  recommended: no HIV Screening recommended: no  Advanced Directives: Written information was not prepared per patient's request.  Referrals & Orders No orders of the defined types were placed in this encounter.   Follow-up Plan . Follow-up with Hali Marry, MD as planned . Schedule your appointment for your tetanus vaccine, covid vaccines and shingrix vaccine (already had the zoster).    I have personally reviewed and noted the following in the patient's chart:   . Medical and social history . Use of alcohol, tobacco or illicit drugs  . Current medications and supplements . Functional ability and status . Nutritional status . Physical activity . Advanced directives . List of other physicians . Hospitalizations, surgeries, and ER visits in previous 12 months . Vitals . Screenings to include cognitive, depression, and falls . Referrals and appointments  In addition, I have reviewed and discussed with Erika Lynch certain preventive protocols, quality metrics, and best practice recommendations. A written personalized care plan for preventive services as well as general preventive health recommendations is available and can be mailed to the patient at her request.      Erika Gens, RN  08/19/2020

## 2020-08-23 ENCOUNTER — Other Ambulatory Visit: Payer: Self-pay | Admitting: General Practice

## 2020-08-23 DIAGNOSIS — Z1159 Encounter for screening for other viral diseases: Secondary | ICD-10-CM

## 2020-08-23 DIAGNOSIS — Z114 Encounter for screening for human immunodeficiency virus [HIV]: Secondary | ICD-10-CM

## 2020-10-21 ENCOUNTER — Other Ambulatory Visit: Payer: Self-pay | Admitting: Family Medicine

## 2020-10-21 DIAGNOSIS — I10 Essential (primary) hypertension: Secondary | ICD-10-CM

## 2020-10-30 ENCOUNTER — Other Ambulatory Visit: Payer: Self-pay | Admitting: Family Medicine

## 2020-10-30 DIAGNOSIS — J449 Chronic obstructive pulmonary disease, unspecified: Secondary | ICD-10-CM

## 2020-10-30 DIAGNOSIS — J4489 Other specified chronic obstructive pulmonary disease: Secondary | ICD-10-CM

## 2020-10-31 NOTE — Telephone Encounter (Signed)
Please call pt and schedule her for f/u on bp/ifg/copd and medication refills. thanks

## 2020-10-31 NOTE — Telephone Encounter (Signed)
LVM for patient to call back to get this appt scheduled. AM

## 2020-11-26 ENCOUNTER — Other Ambulatory Visit: Payer: Self-pay | Admitting: Family Medicine

## 2020-12-02 ENCOUNTER — Ambulatory Visit (INDEPENDENT_AMBULATORY_CARE_PROVIDER_SITE_OTHER): Payer: Medicare HMO | Admitting: Family Medicine

## 2020-12-02 ENCOUNTER — Other Ambulatory Visit: Payer: Self-pay

## 2020-12-02 ENCOUNTER — Encounter: Payer: Self-pay | Admitting: Family Medicine

## 2020-12-02 VITALS — BP 130/59 | HR 76 | Ht 63.0 in | Wt 126.0 lb

## 2020-12-02 DIAGNOSIS — H8110 Benign paroxysmal vertigo, unspecified ear: Secondary | ICD-10-CM | POA: Diagnosis not present

## 2020-12-02 DIAGNOSIS — Z1159 Encounter for screening for other viral diseases: Secondary | ICD-10-CM

## 2020-12-02 DIAGNOSIS — I10 Essential (primary) hypertension: Secondary | ICD-10-CM

## 2020-12-02 DIAGNOSIS — N1831 Chronic kidney disease, stage 3a: Secondary | ICD-10-CM | POA: Diagnosis not present

## 2020-12-02 DIAGNOSIS — R7301 Impaired fasting glucose: Secondary | ICD-10-CM

## 2020-12-02 LAB — POCT GLYCOSYLATED HEMOGLOBIN (HGB A1C): Hemoglobin A1C: 5.7 % — AB (ref 4.0–5.6)

## 2020-12-02 NOTE — Assessment & Plan Note (Signed)
Well controlled. Continue current regimen. Follow up in  6 mo  

## 2020-12-02 NOTE — Progress Notes (Signed)
Established Patient Office Visit  Subjective:  Patient ID: Erika Lynch, female    DOB: 08-14-1932  Age: 87 y.o. MRN: [id number]  CC:  Chief Complaint  Patient presents with  . Hypertension    HPI Erika Lynch presents for   Hypertension- Pt denies chest pain, SOB, dizziness, or heart palpitations.  Taking meds as directed w/o problems.  Denies medication side effects.    Impaired fasting glucose-no increased thirst or urination. No symptoms consistent with hypoglycemia.  She is been really stressed out lately as the apartment where she lives has been extremely strict in fact she had to get rid of her potted plants on her porch because they said that they would could not have them and a friend of hers who lives there is being evicted after living there for 10 years because she was not keeping her place clean but she is 32 and has nowhere to go in fact Anthony has helped her complete a form to try to get housing elsewhere.  She does feel like her vertigo has been a little worse recently.  She usually ends up taking her meclizine in the mornings because usually that is when she notices the vertigo and then it gradually gets better throughout the day.  PHQ-9 score was 21.  She is felt like the situation was super stressful.  Her daughter has been very supportive.  She also says that she has had a couple episodes where her feet will burn and sting.  It can last a day or 2.  She says even the skin will turn red she usually soaks them in vinegar water and they usually get better.  She had actually complained about some left ankle and joint swelling and redness when I last saw her but her uric acid levels were normal which was reassuring.   Past Medical History:  Diagnosis Date  . Cataract   . Dyskinesia of esophagus   . Hypertension   . Kidney stone   . Uterine cancer ([place])   . Vertigo 07/16/2020    Past Surgical History:  Procedure Laterality Date  . ABDOMINAL HYSTERECTOMY      Uterine cancer  . BACK SURGERY    . CATARACT EXTRACTION, BILATERAL      Family History  Problem Relation Age of Onset  . Heart attack Father   . Stroke Father   . Heart attack Brother   . Heart attack Brother   . Colon cancer Sister   . Cancer Sister        lung  . Breast cancer Other     Social History   Socioeconomic History  . Marital status: Divorced    Spouse name: Not on file  . Number of children: 4  . Years of education: 8th   . Highest education level: 8th grade  Occupational History  . Occupation: Retired.     Employer: RETIRED    Comment: [employer]  Tobacco Use  . Smoking status: Current Some Day Smoker  . Smokeless tobacco: Never Used  . Tobacco comment: smokes cig . less than a half pack a week  Vaping Use  . Vaping Use: Never used  Substance and Sexual Activity  . Alcohol use: No  . Drug use: No  . Sexual activity: Not Currently  Other Topics Concern  . Not on file  Social History Narrative   3 living children: [name], Danny.  Daily caffeine use. Regular exercise everyday, walking and doing stretching. She  looks to do puzzles, workbooks and adult coloring books.   Social Determinants of Health   Financial Resource Strain: Low Risk   . Difficulty of Paying Living Expenses: Not hard at all  Food Insecurity: No Food Insecurity  . Worried About Charity fundraiser in the Last Year: Never true  . Ran Out of Food in the Last Year: Never true  Transportation Needs: No Transportation Needs  . Lack of Transportation (Medical): No  . Lack of Transportation (Non-Medical): No  Physical Activity: Sufficiently Active  . Days of Exercise per Week: 7 days  . Minutes of Exercise per Session: 30 min  Stress: No Stress Concern Present  . Feeling of Stress : Not at all  Social Connections: Socially Isolated  . Frequency of Communication with Friends and Family: Once a week  . Frequency of Social Gatherings with Friends and Family: Once a week  .  Attends Religious Services: Never  . Active Member of Clubs or Organizations: No  . Attends Archivist Meetings: Never  . Marital Status: Divorced  Human resources officer Violence: Not At Risk  . Fear of Current or Ex-Partner: No  . Emotionally Abused: No  . Physically Abused: No  . Sexually Abused: No    Outpatient Medications Prior to Visit  Medication Sig Dispense Refill  . albuterol (VENTOLIN HFA) 108 (90 Base) MCG/ACT inhaler INHALE 2 PUFFS INTO THE LUNGS EVERY 6 HOURS AS NEEDED FOR WHEEZING OR SHORTNESS OF BREATH. 18 g 1  . amLODipine (NORVASC) 5 MG tablet Take 1 tablet (5 mg total) by mouth daily. 90 tablet 1  . fluticasone (FLONASE) 50 MCG/ACT nasal spray USE 1 SPRAY INTO BOTH NOSTRILS DAILY 32 g 1  . meclizine (ANTIVERT) 25 MG tablet TAKE 1 TABLET (25 MG TOTAL) BY MOUTH DAILY AS NEEDED FOR DIZZINESS. 90 tablet 1  . metoprolol tartrate (LOPRESSOR) 50 MG tablet TAKE 1 AND 1/2 TABLETS TWICE DAILY 270 tablet 1  . omeprazole (PRILOSEC) 40 MG capsule TAKE 1 CAPSULE EVERY DAY 90 capsule 3  . STIOLTO RESPIMAT 2.5-2.5 MCG/ACT AERS INHALE 2 PUFFS INTO THE LUNGS DAILY. 12 g 3  . vitamin B-12 (CYANOCOBALAMIN) 50 MCG tablet Take 50 mcg by mouth daily.    . ondansetron (ZOFRAN ODT) 4 MG disintegrating tablet Take 1 tablet (4 mg total) by mouth every 8 (eight) hours as needed for nausea or vomiting. 20 tablet 0  . Vitamin D, Ergocalciferol, (DRISDOL) 1.25 MG (50000 UNIT) CAPS capsule Take 1 capsule (50,000 Units total) by mouth every 7 (seven) days. 12 capsule 0   No facility-administered medications prior to visit.    Allergies  Allergen Reactions  . Other Other (See Comments)    Pollen-- sneezing  . Sulfa Antibiotics Rash  . Aspirin   . Cisapride   . Imipramine Hcl   . Mometasone Swelling    Throat swelling   . Penicillins Rash  . Sulfonamide Derivatives Rash    ROS Review of Systems    Objective:    Physical Exam Constitutional:      Appearance: She is  well-developed.  HENT:     Head: Normocephalic and atraumatic.  Cardiovascular:     Rate and Rhythm: Normal rate and regular rhythm.     Heart sounds: Normal heart sounds.  Pulmonary:     Effort: Pulmonary effort is normal.     Breath sounds: Normal breath sounds.  Skin:    General: Skin is warm and dry.  Neurological:     Mental  Status: She is alert and oriented to person, place, and time.  Psychiatric:        Behavior: Behavior normal.     BP (!) 130/59   Pulse 76   Ht 5\' 3"  (1.6 m)   Wt 126 lb (57.2 kg)   SpO2 96%   BMI 22.32 kg/m  Wt Readings from Last 3 Encounters:  12/02/20 126 lb (57.2 kg)  06/11/20 128 lb (58.1 kg)  03/05/20 129 lb 3.2 oz (58.6 kg)     Health Maintenance Due  Topic Date Due  . TETANUS/TDAP  12/05/2018    There are no preventive care reminders to display for this patient.  Lab Results  Component Value Date   TSH 2.19 06/11/2020   Lab Results  Component Value Date   WBC 12.2 (H) 10/23/2019   HGB 15.4 10/23/2019   HCT 45.5 (H) 10/23/2019   MCV 93.4 10/23/2019   PLT 313 10/23/2019   Lab Results  Component Value Date   NA 139 06/11/2020   K 4.9 06/11/2020   CO2 25 06/11/2020   GLUCOSE 122 (H) 06/11/2020   BUN 18 06/11/2020   CREATININE 1.20 (H) 06/11/2020   BILITOT 0.5 06/11/2020   ALKPHOS 74 11/30/2019   AST 14 06/11/2020   ALT 8 06/11/2020   PROT 6.8 06/11/2020   ALBUMIN 4.2 11/30/2019   CALCIUM 10.2 06/11/2020   Lab Results  Component Value Date   CHOL 141 11/30/2019   Lab Results  Component Value Date   HDL 45 11/30/2019   Lab Results  Component Value Date   LDLCALC 79 11/30/2019   Lab Results  Component Value Date   TRIG 91 11/30/2019   Lab Results  Component Value Date   CHOLHDL 3.1 11/30/2019   Lab Results  Component Value Date   HGBA1C 5.7 (A) 12/02/2020      Assessment & Plan:   Problem List Items Addressed This Visit      Cardiovascular and Mediastinum   Essential hypertension    Well  controlled. Continue current regimen. Follow up in  6 mo       Relevant Orders   COMPLETE METABOLIC PANEL WITH GFR   Lipid panel   Hepatitis C Antibody     Endocrine   IFG (impaired fasting glucose) - Primary    A1c of 5.7 today which looks fantastic.  Continue current regimen and follow-up in 6 months.      Relevant Orders   POCT glycosylated hemoglobin (Hb A1C) (Completed)   COMPLETE METABOLIC PANEL WITH GFR   Lipid panel   Hepatitis C Antibody     Nervous and Auditory   BPPV (benign paroxysmal positional vertigo)    Unfortunately she has had a flare with her vertigo recently and just uses meclizine.  We can always consider formal physical therapy for vestibular rehab if needed.        Genitourinary   CKD stage G3a/A1, GFR 45-59 and albumin creatinine ratio <30 mg/g (HCC)    Due to recheck renal function.        Relevant Orders   COMPLETE METABOLIC PANEL WITH GFR   Lipid panel   Hepatitis C Antibody    Other Visit Diagnoses    Encounter for hepatitis C screening test for low risk patient       Relevant Orders   Hepatitis C Antibody      No orders of the defined types were placed in this encounter.   Follow-up: Return in about 6 months (  around 06/04/2021).    Beatrice Lecher, MD

## 2020-12-02 NOTE — Assessment & Plan Note (Signed)
Due to recheck renal function. 

## 2020-12-02 NOTE — Assessment & Plan Note (Signed)
Unfortunately she has had a flare with her vertigo recently and just uses meclizine.  We can always consider formal physical therapy for vestibular rehab if needed.

## 2020-12-02 NOTE — Assessment & Plan Note (Signed)
A1c of 5.7 today which looks fantastic.  Continue current regimen and follow-up in 6 months.

## 2020-12-03 LAB — COMPLETE METABOLIC PANEL WITH GFR
AG Ratio: 1.7 (calc) (ref 1.0–2.5)
ALT: 8 U/L (ref 6–29)
AST: 11 U/L (ref 10–35)
Albumin: 4.5 g/dL (ref 3.6–5.1)
Alkaline phosphatase (APISO): 45 U/L (ref 37–153)
BUN/Creatinine Ratio: 13 (calc) (ref 6–22)
BUN: 16 mg/dL (ref 7–25)
CO2: 28 mmol/L (ref 20–32)
Calcium: 10.4 mg/dL (ref 8.6–10.4)
Chloride: 103 mmol/L (ref 98–110)
Creat: 1.19 mg/dL — ABNORMAL HIGH (ref 0.60–0.88)
GFR, Est African American: 48 mL/min/{1.73_m2} — ABNORMAL LOW (ref >=60)
GFR, Est Non African American: 42 mL/min/{1.73_m2} — ABNORMAL LOW (ref >=60)
Globulin: 2.7 g/dL (calc) (ref 1.9–3.7)
Glucose, Bld: 99 mg/dL (ref 65–99)
Potassium: 4.9 mmol/L (ref 3.5–5.3)
Sodium: 139 mmol/L (ref 135–146)
Total Bilirubin: 0.4 mg/dL (ref 0.2–1.2)
Total Protein: 7.2 g/dL (ref 6.1–8.1)

## 2020-12-03 LAB — LIPID PANEL
Cholesterol: 266 mg/dL — ABNORMAL HIGH (ref <200)
HDL: 51 mg/dL (ref >=50)
LDL Cholesterol (Calc): 177 mg/dL (calc) — ABNORMAL HIGH
Non-HDL Cholesterol (Calc): 215 mg/dL (calc) — ABNORMAL HIGH (ref <130)
Total CHOL/HDL Ratio: 5.2 (calc) — ABNORMAL HIGH (ref <5.0)
Triglycerides: 215 mg/dL — ABNORMAL HIGH (ref <150)

## 2020-12-03 LAB — HEPATITIS C ANTIBODY
Hepatitis C Ab: NONREACTIVE
SIGNAL TO CUT-OFF: 0.01 (ref <1.00)

## 2020-12-06 ENCOUNTER — Other Ambulatory Visit: Payer: Self-pay | Admitting: *Deleted

## 2020-12-06 MED ORDER — ATORVASTATIN CALCIUM 20 MG PO TABS: 20.0000 mg | ORAL_TABLET | Freq: Every day | ORAL | 3 refills | Status: DC

## 2020-12-20 ENCOUNTER — Telehealth: Payer: Self-pay | Admitting: Family Medicine

## 2020-12-20 NOTE — Progress Notes (Signed)
  Chronic Care Management   Outreach Note  12/20/2020 Name: Erika Lynch MRN: [id number] DOB: Jul 25, 1933  Referred by: Hali Marry, MD Reason for referral : No chief complaint on file.   An unsuccessful telephone outreach was attempted today. The patient was referred to the pharmacist for assistance with care management and care coordination.   Follow Up Plan:   Lauretta Grill Upstream Scheduler

## 2021-01-08 ENCOUNTER — Telehealth: Payer: Self-pay | Admitting: Family Medicine

## 2021-01-08 NOTE — Chronic Care Management (AMB) (Signed)
  Chronic Care Management   Note  01/08/2021 Name: Erika Lynch MRN: [id number] DOB: [birth year]  Erika Lynch is a 87 y.o. year old female who is a primary care patient of Madilyn Fireman, Rene Kocher, MD. I reached out to Darryl Nestle by phone today in response to a referral sent by Erika Lynch's PCP, Hali Marry, MD.   Erika Lynch was given information about Chronic Care Management services today including:  1. CCM service includes personalized support from designated clinical staff supervised by her physician, including individualized plan of care and coordination with other care providers 2. 24/7 contact phone numbers for assistance for urgent and routine care needs. 3. Service will only be billed when office clinical staff spend 20 minutes or more in a month to coordinate care. 4. Only one practitioner may furnish and bill the service in a calendar month. 5. The patient may stop CCM services at any time (effective at the end of the month) by phone call to the office staff.   Patient agreed to services and verbal consent obtained.   Follow up plan:   Erika Lynch Upstream Scheduler

## 2021-01-15 ENCOUNTER — Other Ambulatory Visit: Payer: Self-pay | Admitting: Family Medicine

## 2021-02-11 ENCOUNTER — Telehealth: Payer: Self-pay | Admitting: Pharmacist

## 2021-02-11 NOTE — Chronic Care Management (AMB) (Signed)
    Chronic Care Management Pharmacy Assistant   Name: EBELIN DILLEHAY  MRN: [id number] DOB: Nov 24, 1932  KELLYE MIZNER is an 87 y.o. year old female who presents for her initial CCM visit with the clinical pharmacist.  Reason for Encounter: Initial CCM Visit   Recent office visits:   12/02/20- Beatrice Lecher, MD- seen for hypertension, labs ordered, follow up 6 months Orders from labs: Atorvastatin 20 mg daily   Recent consult visits:  No visits noted  Hospital visits:  None in previous 6 months  Medications: Outpatient Encounter Medications as of 02/11/2021  Medication Sig   atorvastatin (LIPITOR) 20 MG tablet Take 1 tablet (20 mg total) by mouth daily.   albuterol (VENTOLIN HFA) 108 (90 Base) MCG/ACT inhaler INHALE 2 PUFFS INTO THE LUNGS EVERY 6 HOURS AS NEEDED FOR WHEEZING OR SHORTNESS OF BREATH.   amLODipine (NORVASC) 5 MG tablet Take 1 tablet (5 mg total) by mouth daily.   fluticasone (FLONASE) 50 MCG/ACT nasal spray USE 1 SPRAY INTO BOTH NOSTRILS DAILY   meclizine (ANTIVERT) 25 MG tablet TAKE 1 TABLET (25 MG TOTAL) BY MOUTH DAILY AS NEEDED FOR DIZZINESS.   metoprolol tartrate (LOPRESSOR) 50 MG tablet TAKE 1 AND 1/2 TABLETS TWICE DAILY   omeprazole (PRILOSEC) 40 MG capsule TAKE 1 CAPSULE EVERY DAY   STIOLTO RESPIMAT 2.5-2.5 MCG/ACT AERS INHALE 2 PUFFS INTO THE LUNGS DAILY.   vitamin B-12 (CYANOCOBALAMIN) 50 MCG tablet Take 50 mcg by mouth daily.   No facility-administered encounter medications on file as of 02/11/2021.    Current Documented Medications albuterol 108 MCG/ACT inhaler amLODipine  5 MG-90 DS last filled 10/28/20 fluticasone 50 MCG/ACT nasal spray- 90 DS last filled 12/01/20 meclizine 25 MG tablet metoprolol tartrate  50 MG -90 DS last filled 11/14/20 omeprazole 40 MG -90 DS last filled 11/04/20 STIOLTO RESPIMAT 2.5-2.5 MCG/ACT AERS -90 DS last filled 11/04/20 vitamin B-12 (CYANOCOBALAMIN) 50 MCG tablet  Star Rating Drugs: atorvastatin  20 MG- 90 DS  last filled 12/07/20  [facility] CPA, CMA

## 2021-02-19 ENCOUNTER — Telehealth: Payer: Medicare HMO

## 2021-03-11 ENCOUNTER — Other Ambulatory Visit: Payer: Self-pay

## 2021-03-11 ENCOUNTER — Ambulatory Visit: Payer: Medicare HMO | Admitting: Cardiovascular Disease

## 2021-03-11 ENCOUNTER — Encounter: Payer: Self-pay | Admitting: Cardiovascular Disease

## 2021-03-11 VITALS — BP 140/70 | HR 69 | Ht 63.0 in | Wt 124.0 lb

## 2021-03-11 DIAGNOSIS — I10 Essential (primary) hypertension: Secondary | ICD-10-CM | POA: Diagnosis not present

## 2021-03-11 DIAGNOSIS — E782 Mixed hyperlipidemia: Secondary | ICD-10-CM | POA: Diagnosis not present

## 2021-03-11 DIAGNOSIS — R002 Palpitations: Secondary | ICD-10-CM | POA: Diagnosis not present

## 2021-03-11 NOTE — Patient Instructions (Signed)

## 2021-03-11 NOTE — Assessment & Plan Note (Signed)
History of palpitations thought to be related to PVCs markedly improved with up titration of her beta-blocker.

## 2021-03-11 NOTE — Assessment & Plan Note (Signed)
History of essential hypertension a blood pressure measured today 140/70.  She is on amlodipine and metoprolol.

## 2021-03-11 NOTE — Progress Notes (Signed)
03/11/2021 Erika Lynch   [birth year]  [id number]  Primary Physician Hali Marry, MD Primary Cardiologist: Lorretta Harp MD Erika Lynch, [place]  HPI:  Erika Lynch is a 87 y.o.  widowed Caucasian female mother of 4 children, grandmother of 78 grandchildren (outlived 3 husbands) who was referred by [name] for evaluation of palpitations and PVCs.  I last saw her in the office 03/05/2020.  She is accompanied by one of her daughters [name] today.. She has a history of hypertension hyperlipidemia.  Her father did have a myocardial infarction.  She is never had a heart attack or stroke.  She denies chest pain or shortness of breath.  She apparently had an issue with her esophagus back in 2009 saw Dr. Stanford Breed at that point.  She said no further episodes since that time and is on antireflux medications.  Recently she is noticing palpitations and saw her PCP who noted PVCs on EKG which was referred here for further evaluation.   I did uptitrate her beta-blocker which resulted in marked improvement in her palpitations.  Her major complaint is pain between her shoulder blades which is somewhat positional and sounds either orthopedic or neurogenic.    Since I saw her a year ago she continues to do well.  She currently denies chest pain, shortness of breath or palpitations.  Current Meds  Medication Sig   albuterol (VENTOLIN HFA) 108 (90 Base) MCG/ACT inhaler INHALE 2 PUFFS INTO THE LUNGS EVERY 6 HOURS AS NEEDED FOR WHEEZING OR SHORTNESS OF BREATH.   amLODipine (NORVASC) 5 MG tablet Take 1 tablet (5 mg total) by mouth daily.   atorvastatin (LIPITOR) 20 MG tablet Take 1 tablet (20 mg total) by mouth daily.   fluticasone (FLONASE) 50 MCG/ACT nasal spray USE 1 SPRAY INTO BOTH NOSTRILS DAILY   meclizine (ANTIVERT) 25 MG tablet TAKE 1 TABLET (25 MG TOTAL) BY MOUTH DAILY AS NEEDED FOR DIZZINESS.   metoprolol tartrate (LOPRESSOR) 50 MG tablet TAKE 1 AND 1/2 TABLETS TWICE DAILY    omeprazole (PRILOSEC) 40 MG capsule TAKE 1 CAPSULE EVERY DAY   STIOLTO RESPIMAT 2.5-2.5 MCG/ACT AERS INHALE 2 PUFFS INTO THE LUNGS DAILY.   vitamin B-12 (CYANOCOBALAMIN) 50 MCG tablet Take 50 mcg by mouth daily.     Allergies  Allergen Reactions   Other Other (See Comments)    Pollen-- sneezing   Sulfa Antibiotics Rash   Aspirin    Cisapride    Imipramine Hcl    Mometasone Swelling    Throat swelling    Penicillins Rash   Sulfonamide Derivatives Rash    Social History   Socioeconomic History   Marital status: Divorced    Spouse name: Not on file   Number of children: 4   Years of education: 8th    Highest education level: 8th grade  Occupational History   Occupation: Retired.     Employer: RETIRED    Comment: [employer]  Tobacco Use   Smoking status: Some Days   Smokeless tobacco: Never   Tobacco comments:    smokes cig . less than a half pack a week  Vaping Use   Vaping Use: Never used  Substance and Sexual Activity   Alcohol use: No   Drug use: No   Sexual activity: Not Currently  Other Topics Concern   Not on file  Social History Narrative   3 living children: [name], Danny.  Daily caffeine use. Regular exercise everyday, walking and  doing stretching. She looks to do puzzles, workbooks and adult coloring books.   Social Determinants of Health   Financial Resource Strain: Low Risk    Difficulty of Paying Living Expenses: Not hard at all  Food Insecurity: No Food Insecurity   Worried About Charity fundraiser in the Last Year: Never true   [place] in the Last Year: Never true  Transportation Needs: No Transportation Needs   Lack of Transportation (Medical): No   Lack of Transportation (Non-Medical): No  Physical Activity: Sufficiently Active   Days of Exercise per Week: 7 days   Minutes of Exercise per Session: 30 min  Stress: No Stress Concern Present   Feeling of Stress : Not at all  Social Connections: Socially Isolated   Frequency  of Communication with Friends and Family: Once a week   Frequency of Social Gatherings with Friends and Family: Once a week   Attends Religious Services: Never   Marine scientist or Organizations: No   Attends Music therapist: Never   Marital Status: Divorced  Human resources officer Violence: Not At Risk   Fear of Current or Ex-Partner: No   Emotionally Abused: No   Physically Abused: No   Sexually Abused: No     Review of Systems: General: negative for chills, fever, night sweats or weight changes.  Cardiovascular: negative for chest pain, dyspnea on exertion, edema, orthopnea, palpitations, paroxysmal nocturnal dyspnea or shortness of breath Dermatological: negative for rash Respiratory: negative for cough or wheezing Urologic: negative for hematuria Abdominal: negative for nausea, vomiting, diarrhea, bright red blood per rectum, melena, or hematemesis Neurologic: negative for visual changes, syncope, or dizziness All other systems reviewed and are otherwise negative except as noted above.    Blood pressure 140/70, pulse 69, height '5\' 3"'$  (1.6 m), weight 124 lb (56.2 kg).  General appearance: alert and no distress Neck: no adenopathy, no carotid bruit, no JVD, supple, symmetrical, trachea midline, and thyroid not enlarged, symmetric, no tenderness/mass/nodules Lungs: clear to auscultation bilaterally Heart: regular rate and rhythm, S1, S2 normal, no murmur, click, rub or gallop Extremities: extremities normal, atraumatic, no cyanosis or edema Pulses: 2+ and symmetric Skin: Skin color, texture, turgor normal. No rashes or lesions Neurologic: Grossly normal  EKG sinus rhythm at 69 with septal Q waves.  I personally reviewed this EKG.  ASSESSMENT AND PLAN:   Hyperlipidemia History of hyperlipidemia with lipid profile performed 11/30/2019 revealing total cholesterol of 141, LDL 79 HDL 45 not on statin therapy.  She was recently started on atorvastatin proxy a month  ago.  Her most recent lipid profile performed 12/02/2020 revealed total cholesterol 266, LDL of 177 HDL 51, significantly increased compared to her prior readings for unclear reasons.  This is followed by her PCP.  Essential hypertension History of essential hypertension a blood pressure measured today 140/70.  She is on amlodipine and metoprolol.  PALPITATIONS History of palpitations thought to be related to PVCs markedly improved with up titration of her beta-blocker.     Lorretta Harp MD FACP,FACC,FAHA, [facility] 03/11/2021 11:02 AM

## 2021-03-11 NOTE — Assessment & Plan Note (Signed)
History of hyperlipidemia with lipid profile performed 11/30/2019 revealing total cholesterol of 141, LDL 79 HDL 45 not on statin therapy.  She was recently started on atorvastatin proxy a month ago.  Her most recent lipid profile performed 12/02/2020 revealed total cholesterol 266, LDL of 177 HDL 51, significantly increased compared to her prior readings for unclear reasons.  This is followed by her PCP.

## 2021-03-31 ENCOUNTER — Other Ambulatory Visit: Payer: Self-pay | Admitting: Family Medicine

## 2021-03-31 DIAGNOSIS — I10 Essential (primary) hypertension: Secondary | ICD-10-CM

## 2021-04-04 ENCOUNTER — Other Ambulatory Visit: Payer: Self-pay

## 2021-04-04 ENCOUNTER — Ambulatory Visit (INDEPENDENT_AMBULATORY_CARE_PROVIDER_SITE_OTHER): Payer: Medicare HMO | Admitting: Pharmacist

## 2021-04-04 DIAGNOSIS — I10 Essential (primary) hypertension: Secondary | ICD-10-CM | POA: Diagnosis not present

## 2021-04-04 DIAGNOSIS — J449 Chronic obstructive pulmonary disease, unspecified: Secondary | ICD-10-CM | POA: Diagnosis not present

## 2021-04-04 DIAGNOSIS — E782 Mixed hyperlipidemia: Secondary | ICD-10-CM | POA: Diagnosis not present

## 2021-04-04 NOTE — Progress Notes (Signed)
Chronic Care Management Pharmacy Note  04/04/2021 Name:  Erika Lynch MRN:  [id number] DOB:  1933/06/22  Summary: addressed HTN, HLD, COPD.   Recommendations/Changes made from today's visit: Recommend repeat lipid panel at November PCP visit, and increase atorvastatin to 38m if LDL >100. Patient experiencing no side effects or concerns at this time.   Plan: f/u with pharmacist in 3 months  Subjective: Erika BERWANGERis an 87o. year old female who is a primary patient of Erika Lynch, Erika Kocher MD.  The CCM team was consulted for assistance with disease management and care coordination needs.    Engaged with patient by telephone for initial visit in response to provider referral for pharmacy case management and/or care coordination services.   Consent to Services:  The patient was given information about Chronic Care Management services, agreed to services, and gave verbal consent prior to initiation of services.  Please see initial visit note for detailed documentation.   Patient Care Team: MHali Marry MD as PCP - General KDarius Bump [facility] (Pharmacist)  Recent office visits:    12/02/20- CBeatrice Lecher MD- seen for hypertension, labs ordered, follow up 6 months Orders from labs: Atorvastatin 20 mg daily    Recent consult visits:  No visits noted   Hospital visits:  None in previous 6 months  Objective:  Lab Results  Component Value Date   CREATININE 1.19 (H) 12/02/2020   CREATININE 1.20 (H) 06/11/2020   CREATININE 1.18 (H) 10/23/2019    Lab Results  Component Value Date   HGBA1C 5.7 (A) 12/02/2020   Last diabetic Eye exam: No results found for: HMDIABEYEEXA  Last diabetic Foot exam: No results found for: HMDIABFOOTEX      Component Value Date/Time   CHOL 266 (H) 12/02/2020 1335   CHOL 141 11/30/2019 0937   TRIG 215 (H) 12/02/2020 1335   HDL 51 12/02/2020 1335   HDL 45 11/30/2019 0937   CHOLHDL 5.2 (H) 12/02/2020 1335    VLDL 45 (H) 05/21/2016 1449   LDLCALC 177 (H) 12/02/2020 1335    Hepatic Function Latest Ref Rng & Units 12/02/2020 06/11/2020 11/30/2019  Total Protein 6.1 - 8.1 g/dL 7.2 6.8 7.0  Albumin 3.6 - 4.6 g/dL - - 4.2  AST 10 - 35 U/L _0 ALT 6 - 29 U/L _1 Alk Phosphatase 39 - 117 IU/L - - 74  Total Bilirubin 0.2 - 1.2 mg/dL 0.4 0.5 0.3  Bilirubin, Direct 0.00 - 0.40 mg/dL - - 0.08    Lab Results  Component Value Date/Time   TSH 2.19 06/11/2020 03:37 PM   TSH 3.15 10/23/2019 03:31 PM    CBC Latest Ref Rng & Units 10/23/2019 08/19/2017 08/13/2016  WBC 3.8 - 10.8 Thousand/uL 12.2(H) 10.5 11.4(H)  Hemoglobin 11.7 - 15.5 g/dL 15.4 15.1 14.7  Hematocrit 35.0 - 45.0 % 45.5(H) 43.4 43.8  Platelets 140 - 400 Thousand/uL 313 304 270    Lab Results  Component Value Date/Time   VD25OH 10 (L) 10/23/2019 03:31 PM    Social History   Tobacco Use  Smoking Status Some Days  Smokeless Tobacco Never  Tobacco Comments   smokes cig . less than a half pack a week   BP Readings from Last 3 Encounters:  03/11/21 140/70  12/02/20 (!) 130/59  06/11/20 138/70   Pulse Readings from Last 3 Encounters:  03/11/21 69  12/02/20 76  06/11/20 77   Wt Readings from Last 3  Encounters:  03/11/21 124 lb (56.2 kg)  12/02/20 126 lb (57.2 kg)  06/11/20 128 lb (58.1 kg)    Assessment: Review of patient past medical history, allergies, medications, health status, including review of consultants reports, laboratory and other test data, was performed as part of comprehensive evaluation and provision of chronic care management services.   SDOH:  (Social Determinants of Health) assessments and interventions performed:    CCM Care Plan  Allergies  Allergen Reactions   Other Other (See Comments)    Pollen-- sneezing   Sulfa Antibiotics Rash   Aspirin    Cisapride    Imipramine Hcl    Mometasone Swelling    Throat swelling    Penicillins Rash   Sulfonamide Derivatives Rash    Medications  Reviewed Today     Reviewed by Lorretta Harp, MD (Physician) on 03/11/21 at 1104  Med List Status: <None>   Medication Order Taking? Sig Documenting Provider Last Dose Status Informant  albuterol (VENTOLIN HFA) 108 (90 Base) MCG/ACT inhaler [id number] Yes INHALE 2 PUFFS INTO THE LUNGS EVERY 6 HOURS AS NEEDED FOR WHEEZING OR SHORTNESS OF BREATH. Erika Marry, MD Taking Active   amLODipine (NORVASC) 5 MG tablet [id number] Yes Take 1 tablet (5 mg total) by mouth daily. Erika Marry, MD Taking Active   atorvastatin (LIPITOR) 20 MG tablet [id number] Yes Take 1 tablet (20 mg total) by mouth daily. Erika Marry, MD Taking Active   fluticasone [facility]) 50 MCG/ACT nasal spray [id number] Yes USE 1 SPRAY INTO BOTH NOSTRILS DAILY Erika Marry, MD Taking Active   meclizine (ANTIVERT) 25 MG tablet [id number] Yes TAKE 1 TABLET (25 MG TOTAL) BY MOUTH DAILY AS NEEDED FOR DIZZINESS. Erika Marry, MD Taking Active   metoprolol tartrate (LOPRESSOR) 50 MG tablet [id number] Yes TAKE 1 AND 1/2 TABLETS TWICE DAILY Erika Marry, MD Taking Active   omeprazole (PRILOSEC) 40 MG capsule [id number] Yes TAKE 1 CAPSULE EVERY DAY Erika Marry, MD Taking Active   STIOLTO RESPIMAT 2.5-2.5 MCG/ACT AERS [id number] Yes INHALE 2 PUFFS INTO THE LUNGS DAILY. Erika Marry, MD Taking Active   vitamin B-12 (CYANOCOBALAMIN) 50 MCG tablet [id number] Yes Take 50 mcg by mouth daily. [provider] Taking Active             Patient Active Problem List   Diagnosis Date Noted   Vitamin D deficiency 10/25/2019   PVC (premature ventricular contraction) 10/23/2019   BPPV (benign paroxysmal positional vertigo) 08/14/2019   IFG (impaired fasting glucose) 08/08/2018   DDD (degenerative disc disease), cervical 07/04/2018   Urine frequency 04/06/2017   History of uterine cancer 04/06/2017   Microscopic hematuria 04/06/2017   History of esophageal stricture 12/17/2016    COPD with asthma ([place]) 09/11/2016   Cervical disc disease 06/13/2012   Lumbar spinal stenosis 06/13/2012   BACK PAIN, LUMBAR 12/09/2009   PALPITATIONS 05/28/2008   Vitamin B12 deficiency 01/04/2008   GERD 12/29/2007   DYSKINESIA OF ESOPHAGUS 12/08/2007   CKD stage G3a/A1, GFR 45-59 and albumin creatinine ratio <30 mg/g ([place]) 10/28/2007   NECK PAIN 10/26/2007   CAROTID BRUIT, LEFT 10/26/2007   Hyperlipidemia 10/12/2007   Essential hypertension 10/12/2007    Immunization History  Administered Date(s) Administered   Fluad Quad(high Dose 65+) 08/14/2019, 06/11/2020   Influenza Split 05/04/2012   Influenza Whole 05/17/2008, 05/27/2009, 04/17/2010   Influenza, High Dose Seasonal PF 04/15/2016, 03/25/2017, 08/08/2018   Influenza-Unspecified 04/03/2013, 04/20/2015   Pneumococcal Conjugate-13 05/13/2015   Pneumococcal  Polysaccharide-23 12/04/2008   Td 12/04/2008   Zoster, Live 05/07/2010    Conditions to be addressed/monitored: HTN, HLD, and COPD  There are no care plans that you recently modified to display for this patient.   Medication Assistance: None required.  Patient affirms current coverage meets needs.  Patient's preferred pharmacy is:  [id number] - [place], [place] - [address] [place] [address] Phone: [phone] Fax: [phone]  [facility] ([facility] - [place], [address] [place] [address] Phone: [phone] Fax: [phone]  [place], [address] [place] [address] [place] [address] Phone: [phone] Fax: [phone]  Uses pill box? No - keeps in original bottles Pt endorses 100% compliance  Follow Up:  Patient agrees to Care Plan and Follow-up.  Plan: Telephone follow up appointment with care management team member scheduled  for:  3 months  Darius Bump

## 2021-04-04 NOTE — Patient Instructions (Signed)
Visit Information   PATIENT GOALS:   Goals Addressed             This Visit's Progress    Medication Management       Patient Goals/Self-Care Activities Over the next 90 days, patient will:  take medications as prescribed  Follow Up Plan: Telephone follow up appointment with care management team member scheduled for:  3 months         Consent to CCM Services: Erika Lynch was given information about Chronic Care Management services including:  CCM service includes personalized support from designated clinical staff supervised by her physician, including individualized plan of care and coordination with other care providers 24/7 contact phone numbers for assistance for urgent and routine care needs. Service will only be billed when office clinical staff spend 20 minutes or more in a month to coordinate care. Only one practitioner may furnish and bill the service in a calendar month. The patient may stop CCM services at any time (effective at the end of the month) by phone call to the office staff. The patient will be responsible for cost sharing (co-pay) of up to 20% of the service fee (after annual deductible is met).  Patient agreed to services and verbal consent obtained.   The patient verbalized understanding of instructions, educational materials, and care plan provided today and agreed to receive a mailed copy of patient instructions, educational materials, and care plan.   Telephone follow up appointment with care management team member scheduled for: 3 months  CLINICAL CARE PLAN: Patient Care Plan: Medication Management     Problem Identified: HTN, HLD, COPD      Long-Range Goal: Disease Progression Prevention   Start Date: 04/04/2021  This Visit's Progress: On track  Priority: High  Note:   Current Barriers:  None at present  Pharmacist Clinical Goal(s):  Over the next 90 days, patient will adhere to plan to optimize therapeutic regimen for cholesterol as  evidenced by report of adherence to recommended medication management changes through collaboration with PharmD and provider.   Interventions: 1:1 collaboration with Hali Marry, MD regarding development and update of comprehensive plan of care as evidenced by provider attestation and co-signature Inter-disciplinary care team collaboration (see longitudinal plan of care) Comprehensive medication review performed; medication list updated in electronic medical record  Hypertension:  Controlled; current treatment:amlodipine 8m daily, metoprolol 75mBID;   Current home readings: not currently checking  Denies hypotensive/hypertensive symptoms  Recommended continue current regimen ,  Hyperlipidemia:  Uncontrolled; current treatment:atorvastatin 2041maily; LDL 177 (12/2020),   Recommended continue current regimen, repeat lipid panel at November PCP visit, and increase atorvastatin to 40m44m LDL >100. Patient experiencing no side effects or concerns at this time.  Chronic Obstructive Pulmonary Disease:  Controlled; current treatment: stiolto daily & albuterol PRN;   0 exacerbations requiring treatment in the last 6 months   Recommended continue current regimen  Patient Goals/Self-Care Activities Over the next 90 days, patient will:  take medications as prescribed  Follow Up Plan: Telephone follow up appointment with care management team member scheduled for:  3 months

## 2021-06-05 ENCOUNTER — Ambulatory Visit: Payer: Medicare HMO | Admitting: Family Medicine

## 2021-06-09 ENCOUNTER — Ambulatory Visit (INDEPENDENT_AMBULATORY_CARE_PROVIDER_SITE_OTHER): Payer: Medicare HMO | Admitting: Family Medicine

## 2021-06-09 ENCOUNTER — Other Ambulatory Visit: Payer: Self-pay

## 2021-06-09 ENCOUNTER — Ambulatory Visit (INDEPENDENT_AMBULATORY_CARE_PROVIDER_SITE_OTHER): Payer: Medicare HMO

## 2021-06-09 ENCOUNTER — Encounter: Payer: Self-pay | Admitting: Family Medicine

## 2021-06-09 VITALS — BP 132/59 | HR 71 | Ht 63.0 in | Wt 126.0 lb

## 2021-06-09 DIAGNOSIS — M545 Low back pain, unspecified: Secondary | ICD-10-CM | POA: Diagnosis not present

## 2021-06-09 DIAGNOSIS — I10 Essential (primary) hypertension: Secondary | ICD-10-CM

## 2021-06-09 DIAGNOSIS — M549 Dorsalgia, unspecified: Secondary | ICD-10-CM

## 2021-06-09 DIAGNOSIS — R7301 Impaired fasting glucose: Secondary | ICD-10-CM

## 2021-06-09 DIAGNOSIS — M791 Myalgia, unspecified site: Secondary | ICD-10-CM

## 2021-06-09 DIAGNOSIS — N1831 Chronic kidney disease, stage 3a: Secondary | ICD-10-CM | POA: Diagnosis not present

## 2021-06-09 DIAGNOSIS — M4184 Other forms of scoliosis, thoracic region: Secondary | ICD-10-CM | POA: Diagnosis not present

## 2021-06-09 DIAGNOSIS — M47814 Spondylosis without myelopathy or radiculopathy, thoracic region: Secondary | ICD-10-CM | POA: Diagnosis not present

## 2021-06-09 DIAGNOSIS — I700 Atherosclerosis of aorta: Secondary | ICD-10-CM | POA: Diagnosis not present

## 2021-06-09 LAB — POCT GLYCOSYLATED HEMOGLOBIN (HGB A1C): Hemoglobin A1C: 5.6 % (ref 4.0–5.6)

## 2021-06-09 MED ORDER — METOPROLOL TARTRATE 50 MG PO TABS: ORAL_TABLET | ORAL | 1 refills | Status: DC

## 2021-06-09 NOTE — Assessment & Plan Note (Signed)
Following renal function every 6 months. 

## 2021-06-09 NOTE — Addendum Note (Signed)
Addended by: Teddy Spike on: 06/09/2021 03:39 PM   Modules accepted: Orders

## 2021-06-09 NOTE — Assessment & Plan Note (Signed)
Due for A1C  
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
## 2021-06-09 NOTE — Assessment & Plan Note (Signed)
Well controlled. Continue current regimen. Follow up in  3-4 mo  

## 2021-06-10 LAB — HEMOGLOBIN A1C
Hgb A1c MFr Bld: 5.7 % of total Hgb — ABNORMAL HIGH (ref <5.7)
Mean Plasma Glucose: 117 mg/dL
eAG (mmol/L): 6.5 mmol/L

## 2021-06-10 LAB — CBC
HCT: 43.1 % (ref 35.0–45.0)
Hemoglobin: 14.6 g/dL (ref 11.7–15.5)
MCH: 31.8 pg (ref 27.0–33.0)
MCHC: 33.9 g/dL (ref 32.0–36.0)
MCV: 93.9 fL (ref 80.0–100.0)
MPV: 11 fL (ref 7.5–12.5)
Platelets: 275 10*3/uL (ref 140–400)
RBC: 4.59 10*6/uL (ref 3.80–5.10)
RDW: 12.9 % (ref 11.0–15.0)
WBC: 12.5 10*3/uL — ABNORMAL HIGH (ref 3.8–10.8)

## 2021-06-10 LAB — BASIC METABOLIC PANEL
BUN/Creatinine Ratio: 9 (calc) (ref 6–22)
BUN: 11 mg/dL (ref 7–25)
CO2: 27 mmol/L (ref 20–32)
Calcium: 10.2 mg/dL (ref 8.6–10.4)
Chloride: 105 mmol/L (ref 98–110)
Creat: 1.19 mg/dL — ABNORMAL HIGH (ref 0.60–0.95)
Glucose, Bld: 89 mg/dL (ref 65–99)
Potassium: 4.8 mmol/L (ref 3.5–5.3)
Sodium: 140 mmol/L (ref 135–146)

## 2021-06-10 LAB — MICROALBUMIN / CREATININE URINE RATIO
Creatinine, Urine: 41 mg/dL (ref 20–275)
Microalb Creat Ratio: 22 mcg/mg creat (ref <30)
Microalb, Ur: 0.9 mg/dL

## 2021-06-10 LAB — CK: Total CK: 49 U/L (ref 29–143)

## 2021-06-10 LAB — SEDIMENTATION RATE: Sed Rate: 9 mm/h (ref 0–30)

## 2021-06-10 NOTE — Progress Notes (Signed)
Call patient: No worrisome protein levels in the urine which is good.

## 2021-06-10 NOTE — Progress Notes (Signed)
Call patient: A1c looks great at 5.6.  White blood cell count is right around 12 which is about her baseline.  Any function is stable at 1.1 which is where she has been for the last several years.  Calcium level looks good.  Just to make sure that she is taking an extra calcium supplement though.  Inflammatory Markle is normal.  So no sign of excess inflammation in the body.

## 2021-06-12 ENCOUNTER — Telehealth: Payer: Self-pay | Admitting: *Deleted

## 2021-06-12 DIAGNOSIS — M549 Dorsalgia, unspecified: Secondary | ICD-10-CM

## 2021-06-12 DIAGNOSIS — M545 Low back pain, unspecified: Secondary | ICD-10-CM

## 2021-06-12 NOTE — Telephone Encounter (Signed)
Orders Placed This Encounter  Procedures   Ambulatory referral to Home Health    Referral Priority:   Routine    Referral Type:   Home Health Care    Referral Reason:   Specialty Services Required    Requested Specialty:   Home Health Services    Number of Visits Requested:   1    

## 2021-06-12 NOTE — Progress Notes (Signed)
Call patient: Note there are 2 x-rays that are reported.  This 1 is for the lumbar spine.  It does show some curving which would call scoliosis.  As well as some thinning of the bones which is consistent with osteopenia.  There is also some significant arthritis.  She does have a shifting of her L3 vertebrae on top of L4 and the L4 is off-center from L5 as well.  I definitely think she would benefit from some formal physical therapy I think this would help her and help reduce her pain.

## 2021-06-12 NOTE — Progress Notes (Signed)
Call patient: She does have a little bit of curvature of her spine as well as some thinning of the bones called osteopenia and arthritis.  But no sign of acute fracture.  I would like for her to consider formal physical therapy for her back to see if we can get her feeling better.

## 2021-07-11 ENCOUNTER — Telehealth: Payer: Self-pay | Admitting: *Deleted

## 2021-07-11 NOTE — Chronic Care Management (AMB) (Signed)
  Care Management   Note  07/11/2021 Name: Erika Lynch MRN: [id number] DOB: February 03, 1933  Erika Lynch is a 87 y.o. year old female who is a primary care patient of Hali Marry, MD and is actively engaged with the care management team. I reached out to Darryl Nestle by phone today to assist with re-scheduling a follow up visit with the Pharmacist  Follow up plan: Unsuccessful telephone outreach attempt made. A HIPAA compliant phone message was left for the patient providing contact information and requesting a return call.   Julian Hy, [place] Management  Direct Dial: [phone]

## 2021-07-14 ENCOUNTER — Other Ambulatory Visit: Payer: Self-pay | Admitting: Family Medicine

## 2021-07-15 NOTE — Chronic Care Management (AMB) (Signed)
°  Care Management   Note  07/15/2021 Name: Erika Lynch MRN: [id number] DOB: 07/05/1933  Erika Lynch is a 87 y.o. year old female who is a primary care patient of Hali Marry, MD and is actively engaged with the care management team. I reached out to Darryl Nestle by phone today to assist with re-scheduling a follow up visit with the Pharmacist  Follow up plan: Telephone appointment with care management team member scheduled for: 07/23/2021  Julian Hy, [place], [place] Management  Direct Dial: [phone]

## 2021-07-17 ENCOUNTER — Telehealth: Payer: Medicare HMO

## 2021-07-23 ENCOUNTER — Telehealth: Payer: Medicare HMO

## 2021-08-05 DIAGNOSIS — E785 Hyperlipidemia, unspecified: Secondary | ICD-10-CM | POA: Diagnosis not present

## 2021-08-05 DIAGNOSIS — E538 Deficiency of other specified B group vitamins: Secondary | ICD-10-CM | POA: Diagnosis not present

## 2021-08-05 DIAGNOSIS — M5030 Other cervical disc degeneration, unspecified cervical region: Secondary | ICD-10-CM | POA: Diagnosis not present

## 2021-08-05 DIAGNOSIS — M48061 Spinal stenosis, lumbar region without neurogenic claudication: Secondary | ICD-10-CM | POA: Diagnosis not present

## 2021-08-05 DIAGNOSIS — I129 Hypertensive chronic kidney disease with stage 1 through stage 4 chronic kidney disease, or unspecified chronic kidney disease: Secondary | ICD-10-CM | POA: Diagnosis not present

## 2021-08-05 DIAGNOSIS — G8929 Other chronic pain: Secondary | ICD-10-CM | POA: Diagnosis not present

## 2021-08-05 DIAGNOSIS — N1831 Chronic kidney disease, stage 3a: Secondary | ICD-10-CM | POA: Diagnosis not present

## 2021-08-05 DIAGNOSIS — M545 Low back pain, unspecified: Secondary | ICD-10-CM | POA: Diagnosis not present

## 2021-08-05 DIAGNOSIS — E559 Vitamin D deficiency, unspecified: Secondary | ICD-10-CM | POA: Diagnosis not present

## 2021-08-05 DIAGNOSIS — Z87442 Personal history of urinary calculi: Secondary | ICD-10-CM | POA: Diagnosis not present

## 2021-08-05 DIAGNOSIS — Z951 Presence of aortocoronary bypass graft: Secondary | ICD-10-CM | POA: Diagnosis not present

## 2021-08-05 DIAGNOSIS — K219 Gastro-esophageal reflux disease without esophagitis: Secondary | ICD-10-CM | POA: Diagnosis not present

## 2021-08-05 DIAGNOSIS — M419 Scoliosis, unspecified: Secondary | ICD-10-CM | POA: Diagnosis not present

## 2021-08-05 DIAGNOSIS — J449 Chronic obstructive pulmonary disease, unspecified: Secondary | ICD-10-CM | POA: Diagnosis not present

## 2021-08-05 DIAGNOSIS — I493 Ventricular premature depolarization: Secondary | ICD-10-CM | POA: Diagnosis not present

## 2021-08-05 DIAGNOSIS — F17200 Nicotine dependence, unspecified, uncomplicated: Secondary | ICD-10-CM | POA: Diagnosis not present

## 2021-08-05 DIAGNOSIS — M542 Cervicalgia: Secondary | ICD-10-CM | POA: Diagnosis not present

## 2021-08-05 DIAGNOSIS — H8110 Benign paroxysmal vertigo, unspecified ear: Secondary | ICD-10-CM | POA: Diagnosis not present

## 2021-08-05 DIAGNOSIS — Z48812 Encounter for surgical aftercare following surgery on the circulatory system: Secondary | ICD-10-CM | POA: Diagnosis not present

## 2021-08-05 DIAGNOSIS — K224 Dyskinesia of esophagus: Secondary | ICD-10-CM | POA: Diagnosis not present

## 2021-08-05 DIAGNOSIS — M791 Myalgia, unspecified site: Secondary | ICD-10-CM | POA: Diagnosis not present

## 2021-08-05 DIAGNOSIS — R7301 Impaired fasting glucose: Secondary | ICD-10-CM | POA: Diagnosis not present

## 2021-08-09 DIAGNOSIS — I639 Cerebral infarction, unspecified: Secondary | ICD-10-CM

## 2021-08-09 HISTORY — DX: Cerebral infarction, unspecified: I63.9

## 2021-08-10 DIAGNOSIS — Z882 Allergy status to sulfonamides status: Secondary | ICD-10-CM | POA: Diagnosis not present

## 2021-08-10 DIAGNOSIS — Z20822 Contact with and (suspected) exposure to covid-19: Secondary | ICD-10-CM | POA: Diagnosis not present

## 2021-08-10 DIAGNOSIS — Z28310 Unvaccinated for covid-19: Secondary | ICD-10-CM | POA: Diagnosis not present

## 2021-08-10 DIAGNOSIS — Z8673 Personal history of transient ischemic attack (TIA), and cerebral infarction without residual deficits: Secondary | ICD-10-CM | POA: Diagnosis not present

## 2021-08-10 DIAGNOSIS — Z79899 Other long term (current) drug therapy: Secondary | ICD-10-CM | POA: Diagnosis not present

## 2021-08-10 DIAGNOSIS — J984 Other disorders of lung: Secondary | ICD-10-CM | POA: Diagnosis not present

## 2021-08-10 DIAGNOSIS — N390 Urinary tract infection, site not specified: Secondary | ICD-10-CM | POA: Diagnosis not present

## 2021-08-10 DIAGNOSIS — Z87442 Personal history of urinary calculi: Secondary | ICD-10-CM | POA: Diagnosis not present

## 2021-08-10 DIAGNOSIS — E782 Mixed hyperlipidemia: Secondary | ICD-10-CM | POA: Diagnosis not present

## 2021-08-10 DIAGNOSIS — I69318 Other symptoms and signs involving cognitive functions following cerebral infarction: Secondary | ICD-10-CM | POA: Diagnosis not present

## 2021-08-10 DIAGNOSIS — N3001 Acute cystitis with hematuria: Secondary | ICD-10-CM | POA: Diagnosis not present

## 2021-08-10 DIAGNOSIS — E559 Vitamin D deficiency, unspecified: Secondary | ICD-10-CM | POA: Diagnosis not present

## 2021-08-10 DIAGNOSIS — G8191 Hemiplegia, unspecified affecting right dominant side: Secondary | ICD-10-CM | POA: Diagnosis not present

## 2021-08-10 DIAGNOSIS — R29709 NIHSS score 9: Secondary | ICD-10-CM | POA: Diagnosis not present

## 2021-08-10 DIAGNOSIS — I10 Essential (primary) hypertension: Secondary | ICD-10-CM | POA: Diagnosis not present

## 2021-08-10 DIAGNOSIS — I708 Atherosclerosis of other arteries: Secondary | ICD-10-CM | POA: Diagnosis not present

## 2021-08-10 DIAGNOSIS — J439 Emphysema, unspecified: Secondary | ICD-10-CM | POA: Diagnosis not present

## 2021-08-10 DIAGNOSIS — I63412 Cerebral infarction due to embolism of left middle cerebral artery: Secondary | ICD-10-CM | POA: Diagnosis not present

## 2021-08-10 DIAGNOSIS — R471 Dysarthria and anarthria: Secondary | ICD-10-CM | POA: Diagnosis not present

## 2021-08-10 DIAGNOSIS — I639 Cerebral infarction, unspecified: Secondary | ICD-10-CM | POA: Diagnosis not present

## 2021-08-10 DIAGNOSIS — Z859 Personal history of malignant neoplasm, unspecified: Secondary | ICD-10-CM | POA: Diagnosis not present

## 2021-08-10 DIAGNOSIS — R9082 White matter disease, unspecified: Secondary | ICD-10-CM | POA: Diagnosis not present

## 2021-08-10 DIAGNOSIS — I69320 Aphasia following cerebral infarction: Secondary | ICD-10-CM | POA: Diagnosis not present

## 2021-08-10 DIAGNOSIS — R4701 Aphasia: Secondary | ICD-10-CM | POA: Diagnosis not present

## 2021-08-10 DIAGNOSIS — F17210 Nicotine dependence, cigarettes, uncomplicated: Secondary | ICD-10-CM | POA: Diagnosis not present

## 2021-08-10 DIAGNOSIS — Z720 Tobacco use: Secondary | ICD-10-CM | POA: Diagnosis not present

## 2021-08-10 DIAGNOSIS — E538 Deficiency of other specified B group vitamins: Secondary | ICD-10-CM | POA: Diagnosis not present

## 2021-08-10 DIAGNOSIS — Z888 Allergy status to other drugs, medicaments and biological substances status: Secondary | ICD-10-CM | POA: Diagnosis not present

## 2021-08-10 DIAGNOSIS — Z880 Allergy status to penicillin: Secondary | ICD-10-CM | POA: Diagnosis not present

## 2021-08-10 DIAGNOSIS — J949 Pleural condition, unspecified: Secondary | ICD-10-CM | POA: Diagnosis not present

## 2021-08-10 DIAGNOSIS — R2689 Other abnormalities of gait and mobility: Secondary | ICD-10-CM | POA: Diagnosis not present

## 2021-08-10 DIAGNOSIS — R202 Paresthesia of skin: Secondary | ICD-10-CM | POA: Diagnosis not present

## 2021-08-10 DIAGNOSIS — I69398 Other sequelae of cerebral infarction: Secondary | ICD-10-CM | POA: Diagnosis not present

## 2021-08-10 DIAGNOSIS — K219 Gastro-esophageal reflux disease without esophagitis: Secondary | ICD-10-CM | POA: Diagnosis not present

## 2021-08-10 DIAGNOSIS — I6523 Occlusion and stenosis of bilateral carotid arteries: Secondary | ICD-10-CM | POA: Diagnosis not present

## 2021-08-10 DIAGNOSIS — K5900 Constipation, unspecified: Secondary | ICD-10-CM | POA: Diagnosis not present

## 2021-08-10 DIAGNOSIS — K30 Functional dyspepsia: Secondary | ICD-10-CM | POA: Diagnosis not present

## 2021-08-10 DIAGNOSIS — R29818 Other symptoms and signs involving the nervous system: Secondary | ICD-10-CM | POA: Diagnosis not present

## 2021-08-11 DIAGNOSIS — I639 Cerebral infarction, unspecified: Secondary | ICD-10-CM | POA: Diagnosis not present

## 2021-08-12 DIAGNOSIS — I639 Cerebral infarction, unspecified: Secondary | ICD-10-CM | POA: Diagnosis not present

## 2021-08-12 DIAGNOSIS — I10 Essential (primary) hypertension: Secondary | ICD-10-CM | POA: Diagnosis not present

## 2021-08-12 DIAGNOSIS — N3001 Acute cystitis with hematuria: Secondary | ICD-10-CM | POA: Diagnosis not present

## 2021-08-13 DIAGNOSIS — I63412 Cerebral infarction due to embolism of left middle cerebral artery: Secondary | ICD-10-CM | POA: Insufficient documentation

## 2021-08-13 DIAGNOSIS — I639 Cerebral infarction, unspecified: Secondary | ICD-10-CM | POA: Diagnosis not present

## 2021-08-14 DIAGNOSIS — I63412 Cerebral infarction due to embolism of left middle cerebral artery: Secondary | ICD-10-CM | POA: Diagnosis not present

## 2021-08-14 DIAGNOSIS — N3001 Acute cystitis with hematuria: Secondary | ICD-10-CM | POA: Diagnosis not present

## 2021-08-14 DIAGNOSIS — E782 Mixed hyperlipidemia: Secondary | ICD-10-CM | POA: Diagnosis not present

## 2021-08-14 DIAGNOSIS — I10 Essential (primary) hypertension: Secondary | ICD-10-CM | POA: Diagnosis not present

## 2021-08-15 DIAGNOSIS — I69318 Other symptoms and signs involving cognitive functions following cerebral infarction: Secondary | ICD-10-CM | POA: Diagnosis not present

## 2021-08-15 DIAGNOSIS — R4701 Aphasia: Secondary | ICD-10-CM | POA: Diagnosis not present

## 2021-08-15 DIAGNOSIS — N390 Urinary tract infection, site not specified: Secondary | ICD-10-CM | POA: Diagnosis not present

## 2021-08-15 DIAGNOSIS — I639 Cerebral infarction, unspecified: Secondary | ICD-10-CM | POA: Diagnosis not present

## 2021-08-15 DIAGNOSIS — R2689 Other abnormalities of gait and mobility: Secondary | ICD-10-CM | POA: Diagnosis not present

## 2021-08-15 DIAGNOSIS — I63512 Cerebral infarction due to unspecified occlusion or stenosis of left middle cerebral artery: Secondary | ICD-10-CM | POA: Diagnosis not present

## 2021-08-15 DIAGNOSIS — E782 Mixed hyperlipidemia: Secondary | ICD-10-CM | POA: Diagnosis not present

## 2021-08-15 DIAGNOSIS — Z7409 Other reduced mobility: Secondary | ICD-10-CM | POA: Diagnosis not present

## 2021-08-15 DIAGNOSIS — I63412 Cerebral infarction due to embolism of left middle cerebral artery: Secondary | ICD-10-CM | POA: Diagnosis not present

## 2021-08-15 DIAGNOSIS — K5900 Constipation, unspecified: Secondary | ICD-10-CM | POA: Diagnosis not present

## 2021-08-15 DIAGNOSIS — I10 Essential (primary) hypertension: Secondary | ICD-10-CM | POA: Diagnosis not present

## 2021-08-15 DIAGNOSIS — K219 Gastro-esophageal reflux disease without esophagitis: Secondary | ICD-10-CM | POA: Diagnosis not present

## 2021-08-15 DIAGNOSIS — E538 Deficiency of other specified B group vitamins: Secondary | ICD-10-CM | POA: Diagnosis not present

## 2021-08-15 DIAGNOSIS — I69398 Other sequelae of cerebral infarction: Secondary | ICD-10-CM | POA: Diagnosis not present

## 2021-08-15 DIAGNOSIS — K30 Functional dyspepsia: Secondary | ICD-10-CM | POA: Diagnosis not present

## 2021-08-15 DIAGNOSIS — I69320 Aphasia following cerebral infarction: Secondary | ICD-10-CM | POA: Diagnosis not present

## 2021-08-15 DIAGNOSIS — N3000 Acute cystitis without hematuria: Secondary | ICD-10-CM | POA: Diagnosis not present

## 2021-08-15 DIAGNOSIS — Z720 Tobacco use: Secondary | ICD-10-CM | POA: Diagnosis not present

## 2021-08-16 DIAGNOSIS — Z720 Tobacco use: Secondary | ICD-10-CM | POA: Diagnosis not present

## 2021-08-16 DIAGNOSIS — I10 Essential (primary) hypertension: Secondary | ICD-10-CM | POA: Diagnosis not present

## 2021-08-16 DIAGNOSIS — I63512 Cerebral infarction due to unspecified occlusion or stenosis of left middle cerebral artery: Secondary | ICD-10-CM | POA: Diagnosis not present

## 2021-08-16 DIAGNOSIS — R4701 Aphasia: Secondary | ICD-10-CM | POA: Diagnosis not present

## 2021-08-16 DIAGNOSIS — Z7409 Other reduced mobility: Secondary | ICD-10-CM | POA: Diagnosis not present

## 2021-08-17 DIAGNOSIS — Z720 Tobacco use: Secondary | ICD-10-CM | POA: Diagnosis not present

## 2021-08-17 DIAGNOSIS — Z7409 Other reduced mobility: Secondary | ICD-10-CM | POA: Diagnosis not present

## 2021-08-17 DIAGNOSIS — R4701 Aphasia: Secondary | ICD-10-CM | POA: Diagnosis not present

## 2021-08-17 DIAGNOSIS — I10 Essential (primary) hypertension: Secondary | ICD-10-CM | POA: Diagnosis not present

## 2021-08-17 DIAGNOSIS — I63512 Cerebral infarction due to unspecified occlusion or stenosis of left middle cerebral artery: Secondary | ICD-10-CM | POA: Diagnosis not present

## 2021-08-18 DIAGNOSIS — Z7409 Other reduced mobility: Secondary | ICD-10-CM | POA: Diagnosis not present

## 2021-08-18 DIAGNOSIS — Z720 Tobacco use: Secondary | ICD-10-CM | POA: Diagnosis not present

## 2021-08-18 DIAGNOSIS — I10 Essential (primary) hypertension: Secondary | ICD-10-CM | POA: Diagnosis not present

## 2021-08-18 DIAGNOSIS — I63512 Cerebral infarction due to unspecified occlusion or stenosis of left middle cerebral artery: Secondary | ICD-10-CM | POA: Diagnosis not present

## 2021-08-18 DIAGNOSIS — R4701 Aphasia: Secondary | ICD-10-CM | POA: Diagnosis not present

## 2021-08-31 DIAGNOSIS — I10 Essential (primary) hypertension: Secondary | ICD-10-CM | POA: Diagnosis not present

## 2021-08-31 DIAGNOSIS — I69319 Unspecified symptoms and signs involving cognitive functions following cerebral infarction: Secondary | ICD-10-CM | POA: Diagnosis not present

## 2021-08-31 DIAGNOSIS — R531 Weakness: Secondary | ICD-10-CM | POA: Diagnosis not present

## 2021-08-31 DIAGNOSIS — R2681 Unsteadiness on feet: Secondary | ICD-10-CM | POA: Diagnosis not present

## 2021-08-31 DIAGNOSIS — I69398 Other sequelae of cerebral infarction: Secondary | ICD-10-CM | POA: Diagnosis not present

## 2021-08-31 DIAGNOSIS — I69320 Aphasia following cerebral infarction: Secondary | ICD-10-CM | POA: Diagnosis not present

## 2021-08-31 DIAGNOSIS — N390 Urinary tract infection, site not specified: Secondary | ICD-10-CM | POA: Diagnosis not present

## 2021-08-31 DIAGNOSIS — E785 Hyperlipidemia, unspecified: Secondary | ICD-10-CM | POA: Diagnosis not present

## 2021-08-31 DIAGNOSIS — Z7902 Long term (current) use of antithrombotics/antiplatelets: Secondary | ICD-10-CM | POA: Diagnosis not present

## 2021-09-01 DIAGNOSIS — Z7902 Long term (current) use of antithrombotics/antiplatelets: Secondary | ICD-10-CM | POA: Diagnosis not present

## 2021-09-01 DIAGNOSIS — N390 Urinary tract infection, site not specified: Secondary | ICD-10-CM | POA: Diagnosis not present

## 2021-09-01 DIAGNOSIS — E785 Hyperlipidemia, unspecified: Secondary | ICD-10-CM | POA: Diagnosis not present

## 2021-09-01 DIAGNOSIS — R531 Weakness: Secondary | ICD-10-CM | POA: Diagnosis not present

## 2021-09-01 DIAGNOSIS — I69398 Other sequelae of cerebral infarction: Secondary | ICD-10-CM | POA: Diagnosis not present

## 2021-09-01 DIAGNOSIS — I69320 Aphasia following cerebral infarction: Secondary | ICD-10-CM | POA: Diagnosis not present

## 2021-09-01 DIAGNOSIS — I10 Essential (primary) hypertension: Secondary | ICD-10-CM | POA: Diagnosis not present

## 2021-09-01 DIAGNOSIS — I69319 Unspecified symptoms and signs involving cognitive functions following cerebral infarction: Secondary | ICD-10-CM | POA: Diagnosis not present

## 2021-09-01 DIAGNOSIS — R2681 Unsteadiness on feet: Secondary | ICD-10-CM | POA: Diagnosis not present

## 2021-09-02 ENCOUNTER — Other Ambulatory Visit: Payer: Self-pay

## 2021-09-02 ENCOUNTER — Encounter: Payer: Self-pay | Admitting: Family Medicine

## 2021-09-02 ENCOUNTER — Ambulatory Visit (INDEPENDENT_AMBULATORY_CARE_PROVIDER_SITE_OTHER): Payer: Commercial Managed Care - HMO | Admitting: Family Medicine

## 2021-09-02 VITALS — BP 132/72 | HR 111 | Temp 97.6°F | Ht 63.0 in | Wt 130.0 lb

## 2021-09-02 DIAGNOSIS — E559 Vitamin D deficiency, unspecified: Secondary | ICD-10-CM

## 2021-09-02 DIAGNOSIS — E538 Deficiency of other specified B group vitamins: Secondary | ICD-10-CM

## 2021-09-02 DIAGNOSIS — I63412 Cerebral infarction due to embolism of left middle cerebral artery: Secondary | ICD-10-CM

## 2021-09-02 DIAGNOSIS — N1831 Chronic kidney disease, stage 3a: Secondary | ICD-10-CM

## 2021-09-02 DIAGNOSIS — J449 Chronic obstructive pulmonary disease, unspecified: Secondary | ICD-10-CM | POA: Diagnosis not present

## 2021-09-02 DIAGNOSIS — R110 Nausea: Secondary | ICD-10-CM

## 2021-09-02 DIAGNOSIS — R1030 Lower abdominal pain, unspecified: Secondary | ICD-10-CM | POA: Diagnosis not present

## 2021-09-02 DIAGNOSIS — Z720 Tobacco use: Secondary | ICD-10-CM | POA: Diagnosis not present

## 2021-09-02 DIAGNOSIS — R42 Dizziness and giddiness: Secondary | ICD-10-CM

## 2021-09-02 DIAGNOSIS — H8110 Benign paroxysmal vertigo, unspecified ear: Secondary | ICD-10-CM | POA: Diagnosis not present

## 2021-09-02 DIAGNOSIS — I10 Essential (primary) hypertension: Secondary | ICD-10-CM | POA: Diagnosis not present

## 2021-09-02 MED ORDER — ONDANSETRON 4 MG PO TBDP: 4.0000 mg | ORAL_TABLET | Freq: Three times a day (TID) | ORAL | 0 refills | Status: DC | PRN

## 2021-09-02 MED ORDER — PROMETHAZINE HCL 25 MG/ML IJ SOLN
25.0000 mg | Freq: Once | INTRAMUSCULAR | Status: AC
  Administered 2021-09-02: 25 mg via INTRAMUSCULAR

## 2021-09-02 MED ORDER — ALBUTEROL SULFATE HFA 108 (90 BASE) MCG/ACT IN AERS: 2.0000 | INHALATION_SPRAY | Freq: Four times a day (QID) | RESPIRATORY_TRACT | 1 refills | Status: DC | PRN

## 2021-09-02 MED ORDER — METOPROLOL SUCCINATE ER 25 MG PO TB24: 25.0000 mg | ORAL_TABLET | Freq: Every day | ORAL | 2 refills | Status: DC

## 2021-09-02 MED ORDER — STIOLTO RESPIMAT 2.5-2.5 MCG/ACT IN AERS: 2.0000 | INHALATION_SPRAY | Freq: Every day | RESPIRATORY_TRACT | 3 refills | Status: DC

## 2021-09-02 NOTE — Assessment & Plan Note (Signed)
Does need to be on the Stiolto there was a little bit of confusion because she was given aAnoro in the hospital and they dispensed her what was left of the sample but they provided.  We discussed she can certainly use that for couple of days until she runs out.  It was only a 7-day sample.  But then after that she can start the Brent.  I did go ahead and send over a new prescription to the pharmacy today

## 2021-09-02 NOTE — Progress Notes (Signed)
Established Patient Office Visit  Subjective:  Patient ID: Erika Lynch, female    DOB: Jan 04, 1933  Age: 88 y.o. MRN: [id number]  CC:  Chief Complaint  Patient presents with   hospital followup    HPI Erika Lynch presents for hospital f/u.  She was admitted on January 8 and discharged home on January 13 for a length of stay of 6 days.  She was diagnosed with a CVA due to embolism of the left middle cerebral artery.  She presented to the emergency department on January 8 for changes in speech.  Initial CT of the head in the ED was negative but she had a follow-up CT angiogram of the head and neck and was diagnosed with a middle cerebral artery embolism.  Unable to do a thrombectomy. Echo was normal.   She was also diagnosed with acute cystitis.  Today she is having a lot of lower abdominal pain from the bellybutton down its on both sides of the abdomen but she denies any burning with urination.  We will try to do a urinalysis just to make sure that there is no persistent symptoms.  Also since coming home she has been extremely nauseated any motion makes her feel more nauseated.  She has had significant problems with BPV in the past.  [facility] is currently coming out from home health.  Also received notes from [facility] rehab hospital.  He was using some NicoDerm patches while there.  And was also taking B12.  Active Hospital Problems  Diagnosis Date Noted POA   *Cerebrovascular accident (CVA) due to embolism of left middle cerebral artery (*) 08/13/2021 Yes   Acute cystitis without hematuria 08/15/2021 Yes   Tobacco abuse 08/15/2021 Yes   Tobacco abuse counseling 60/05/9322 Not Applicable   Vitamin D deficiency 08/15/2021 Yes   Vitamin B12 deficiency 08/15/2021 Yes   Aphasia due to acute stroke (*) 08/13/2021 Yes   Primary hypertension 08/13/2021 Yes   Mixed hyperlipidemia 08/13/2021 Yes   Cerebral accident due to embolism of left middle cerebral artery: Initial Head CT  in the emergency department revealed no acute intracranial abnormality Head CT personally viewed and unremarkable and IV TNK delivered weight based at 1519 after informed discussion with daughter that included an explanation of risk benefits and alternatives. CT angiogram of head and neck personally viewed by Dr. Harvel Ricks and Dr Terrill Mohr and left M3 occlusion too distal to safely access for thrombectomy.The daughter now at the bedside was made aware of this decision. Etiology of Stroke: Embolic of undetermined etiology - Stroke Risk Factors: Hypertension, Hyperlipidemia and Tobacco use disorder   - MRI brain without contrast revealed a distal left MCA infarct. - Electrocardiogram: Normal sinus rhythm.  - Transthoracic Echocardiogram: Left ventricular ejection fraction was 55-60%. Wall motion was normal. Injection of contrast documented no interatrial shunt.  - Since the etiology of the stroke is cryptogenic, "A 30 day cardiac monitor is recommended to rule out paroxysmal atrial fibrillation  - Serum Risk Factor Labs: Hemoglobin A1C: 5.9 Lipid profile checked: Demonstrated Pure hypercholesterolemia with LDL 49 and triglyceride of 190.  Secondary stroke prevention on discharge: - Antiplatelet/ Anticoagulation Medications prior to admission- The patient was not on any antiplatelet or anticoagulation therapy prior to admission. - Antiplatelet/Anticoagulation therapy- Dual antiplatelet therapy with Aspirin 81 mg and Plavix for 21 days, followed by Aspirin 39m alone thereafter per POINT Trial for minor ischemic stroke and high risk TIA.  - Statin therapy- She was started on atorvastatin 20  mg orally at bedtime. The patient was not given high dose statin due to the following reason: presently is achieving target LDL or LDL<70 spontaneously or by medical therapy.  Diet: She was started on a regular diet after speech therapy swallow evaluation. - X-ray of the chest one-view showed no acute  cardiopulmonary abnormalities.  - Electrolytes were checked and replaced as needed. -- She was started on DVT prophylaxis with SCDs and Lovenox 84m daily subcutaneously.   Other medical problems addressed during this hospitalization:  Essential Hypertension:  - The patient was on Amlodipine 5 mg by mouth daily and Metroprolol 75 mg by mouth daily prior to admission. - Allowing for permissive hypertension, the patient received antihypertensive agents during intrahospital stay - She will be discharged on amlodipine 5 mg by mouth daily and Metroprolol 25 mg by mouth twice a day. Dose can be titrated to home dose if need be. - She will need to follow up with her primary care provider for continued antihypertensive management after discharge.   Nutritional Deficiencies:  - Nutritional studies (Vitamin B12, Vitamin D, folate) were ordered and replaced as appropriate.  - The patient was found to have Vitamin D insufficiency at 21.5, and was placed on Vitamin D3 2000 units by mouth daily. Vitamin B12 was 562 and folate 5.7.  Acute cystitis without hematuria : Urinalysis on admission was positive for UTI and patient completed 5-day course of Rocephin 1 g IV. Scheduled to to be given fosfomycin 3 g tomorrow and day after a repeat urinalysis is recommended.  Tobacco abuse: Patient was encouraged on smoking cessation and nicotine 14 mg/every 24 hours was ordered.  Consults during Hospitalization: None  Rehabilitation Assessment: Physical therapy, Occupational therapy and Speech therapy evaluated the patient and acute inpatient rehabilitation was recommended. We believe that patient is medically stable and will be discharge to acute rehab facility.  Follow up: She will need close follow up with a primary care physician and outpatient neurologist after discharge for optimal and continued vascular risk factor assessment and reduction. [facility]. We will recommend that [facility] staffs should schedule a follow-up appointment with [facility] or outpatient neurologist prior to patient being discharged. .Marland Kitchen Discharge Medications   DISCONTINUED medications   acetaminophen (TYLENOL ARTHRITIS,MAPAP) 650 MG CR tablet  traMADol (ULTRAM) 50 mg tablet    NEW medications  Details  acetaminophen (TYLENOL) 325 mg tablet Take two tablets (650 mg dose) by mouth every 6 (six) hours as needed for Pain or Fever. Start date: 08/15/2021   cholecalciferol (VITAMIN D3) 1000 units (25 mcg) tablet Take two tablets (2,000 Units dose) by mouth daily. Start date: 08/16/2021   clopidogrel bisulfate (PLAVIX) 75 mg tablet Take one tablet (75 mg dose) by mouth daily for 21 days. Start date: 08/16/2021, End date: 09/06/2021   docusate sodium 100 MG CAPS Take one capsule (100 mg dose) by mouth 2 (two) times daily for 10 days. Start date: 08/15/2021, End date: 08/25/2021   fish oil (SEA-OMEGA) 1000 mg CAPS Take one capsule (1,000 mg dose) by mouth 2 (two) times daily. Start date: 08/15/2021   fosfomycin (MONUROL) 3 g PACK Take three g by mouth once for 1 dose. Start date: 08/16/2021, End date: 08/16/2021   nicotine (HABITROL,NICODERM CQ) 14 mg/24 hours Place one patch onto the skin daily. Start date: 08/15/2021   nitroGLYCERIN (NITROSTAT) 0.4 mg SL tablet Place one tablet (0.4  mg dose) under the tongue every 5 (five) minutes as needed for Chest pain. Start date: 08/15/2021    CHANGED medications  Details  atorvastatin (LIPITOR) 20 mg tablet Take one tablet (20 mg dose) by mouth at bedtime. Start date: 08/15/2021   vitamin B-12 1000 MCG tablet Take one tablet (1,000 mcg dose) by mouth daily. Start date: 08/16/2021    CONTINUED medications  Details  albuterol sulfate HFA (PROVENTIL,VENTOLIN,PROAIR) 108 (90 Base) MCG/ACT inhaler Inhale two puffs into the lungs every 6 (six) hours as needed.    amLODIPine besylate (NORVASC) 5 mg tablet Take one tablet (5 mg dose) by mouth daily.   fluticasone propionate (FLONASE) 50 mcg/actuation nasal spray one spray by Both Nostrils route daily.   meclizine HCl (ANTIVERT) 25 mg tablet Take one tablet (25 mg dose) by mouth 3 (three) times a day as needed for Dizziness.   metoprolol tartrate (LOPRESSOR) 50 mg tablet Take one and a half tablets (75 mg dose) by mouth 2 (two) times daily.   omeprazole (PRILOSEC) 40 mg capsule Take one capsule (40 mg dose) by mouth daily.   ondansetron (ZOFRAN-ODT) 4 mg disintegrating tablet Take one tablet (4 mg dose) by mouth every 6 (six) hours as needed for Nausea.   tiotropium-olodaterol (STIOLTO RESPIMAT) 2.5-2.5 mcg/actuation inhaler Inhale two puffs into the lungs daily.     Past Medical History:  Diagnosis Date   Cataract    Dyskinesia of esophagus    Hypertension    Kidney stone    Uterine cancer ([place])    Vertigo 07/16/2020    Past Surgical History:  Procedure Laterality Date   ABDOMINAL HYSTERECTOMY     Uterine cancer   BACK SURGERY     CATARACT EXTRACTION, BILATERAL      Family History  Problem Relation Age of Onset   Heart attack Father    Stroke Father    Heart attack Brother    Heart attack Brother    Colon cancer Sister    Cancer Sister        lung   Breast cancer Other     Social History   Socioeconomic History   Marital status: Divorced    Spouse name: Not on file   Number of children: 4   Years of education: 8th    Highest education level: 8th grade  Occupational History   Occupation: Retired.     Employer: RETIRED    Comment: [employer]  Tobacco Use   Smoking status: Some Days   Smokeless tobacco: Never   Tobacco comments:    smokes cig . less than a half pack a week  Vaping Use   Vaping Use: Never used  Substance and Sexual Activity   Alcohol use: No   Drug use: No   Sexual activity: Not Currently  Other Topics Concern   Not on file  Social History  Narrative   3 living children: [name], Danny.  Daily caffeine use. Regular exercise everyday, walking and doing stretching. She looks to do puzzles, workbooks and adult coloring books.   Social Determinants of Health   Financial Resource Strain: Not on file  Food Insecurity: Not on file  Transportation Needs: Not on file  Physical Activity: Not on file  Stress: Not on file  Social Connections: Not on file  Intimate Partner Violence: Not on file    Outpatient Medications Prior to Visit  Medication Sig Dispense Refill   acetaminophen (TYLENOL) 325 MG tablet Take by mouth.  atorvastatin (LIPITOR) 20 MG tablet Take 1 tablet (20 mg total) by mouth daily. 90 tablet 3   calcium carbonate (TUMS EX) 750 MG chewable tablet Chew 1 tablet by mouth daily.     Cholecalciferol 25 MCG (1000 UT) tablet Take by mouth.     clopidogrel (PLAVIX) 75 MG tablet Take 75 mg by mouth daily.     cyanocobalamin 1000 MCG tablet Take by mouth.     Omega-3 Fatty Acids (FISH OIL) 1000 MG CAPS Take by mouth.     omeprazole (PRILOSEC) 40 MG capsule TAKE 1 CAPSULE EVERY DAY 90 capsule 3   albuterol (VENTOLIN HFA) 108 (90 Base) MCG/ACT inhaler INHALE 2 PUFFS INTO THE LUNGS EVERY 6 HOURS AS NEEDED FOR WHEEZING OR SHORTNESS OF BREATH. 18 g 1   amLODipine (NORVASC) 5 MG tablet TAKE 1 TABLET EVERY DAY 90 tablet 1   amLODipine (NORVASC) 5 MG tablet Take by mouth.     STIOLTO RESPIMAT 2.5-2.5 MCG/ACT AERS INHALE 2 PUFFS INTO THE LUNGS DAILY. 12 g 3   vitamin B-12 (CYANOCOBALAMIN) 50 MCG tablet Take 50 mcg by mouth daily. (Patient not taking: Reported on 09/02/2021)     atorvastatin (LIPITOR) 20 MG tablet Take by mouth. (Patient not taking: Reported on 09/02/2021)     fluticasone (FLONASE) 50 MCG/ACT nasal spray USE 1 SPRAY INTO BOTH NOSTRILS DAILY 32 g 1   meclizine (ANTIVERT) 25 MG tablet TAKE 1 TABLET (25 MG TOTAL) BY MOUTH DAILY AS NEEDED FOR DIZZINESS. (Patient not taking: Reported on 09/02/2021) 90 tablet 1    metoprolol tartrate (LOPRESSOR) 50 MG tablet TAKE 1 AND 1/2 TABLETS TWICE DAILY (Patient not taking: Reported on 09/02/2021) 270 tablet 1   No facility-administered medications prior to visit.    Allergies  Allergen Reactions   Other Other (See Comments)    Pollen-- sneezing   Sulfa Antibiotics Rash   Aspirin    Cisapride    Imipramine Hcl    Mometasone Swelling    Throat swelling    Penicillins Rash   Sulfonamide Derivatives Rash    ROS Review of Systems    Objective:    Physical Exam  BP 132/72 (BP Location: Left Arm, Patient Position: Sitting, Cuff Size: Normal)    Pulse (!) 111    Temp 97.6 F (36.4 C) (Oral)    Ht '5\' 3"'  (1.6 m)    Wt 130 lb (59 kg)    SpO2 97%    BMI 23.03 kg/m  Wt Readings from Last 3 Encounters:  09/02/21 130 lb (59 kg)  06/09/21 126 lb (57.2 kg)  03/11/21 124 lb (56.2 kg)     There are no preventive care reminders to display for this patient.  There are no preventive care reminders to display for this patient.  Lab Results  Component Value Date   TSH 2.19 06/11/2020   Lab Results  Component Value Date   WBC 8.7 09/02/2021   HGB 16.1 (H) 09/02/2021   HCT 47.6 (H) 09/02/2021   MCV 92.4 09/02/2021   PLT 399 09/02/2021   Lab Results  Component Value Date   NA 135 09/02/2021   K 4.9 09/02/2021   CO2 25 09/02/2021   GLUCOSE 90 09/02/2021   BUN 24 09/02/2021   CREATININE 1.48 (H) 09/02/2021   BILITOT 0.5 09/02/2021   ALKPHOS 74 11/30/2019   AST 30 09/02/2021   ALT 60 (H) 09/02/2021   PROT 7.6 09/02/2021   ALBUMIN 4.2 11/30/2019   CALCIUM 10.8 (H) 09/02/2021  EGFR 34 (L) 09/02/2021   Lab Results  Component Value Date   CHOL 266 (H) 12/02/2020   Lab Results  Component Value Date   HDL 51 12/02/2020   Lab Results  Component Value Date   LDLCALC 177 (H) 12/02/2020   Lab Results  Component Value Date   TRIG 215 (H) 12/02/2020   Lab Results  Component Value Date   CHOLHDL 5.2 (H) 12/02/2020   Lab Results   Component Value Date   HGBA1C 5.6 06/09/2021      Assessment & Plan:   Problem List Items Addressed This Visit       Cardiovascular and Mediastinum   Essential hypertension    Pressure actually looks really good today.  Continue with the metoprolol but continue to hold the amlodipine.  If as she starts to feel better and appetite increases blood pressure starts going up then we can always add it back.      Relevant Medications   metoprolol succinate (TOPROL-XL) 25 MG 24 hr tablet   Other Relevant Orders   COMPLETE METABOLIC PANEL WITH GFR (Completed)   Cerebrovascular accident (CVA) due to embolism of left middle cerebral artery (HCC)    Persistent aphasia, receptive and expressive.  Continue with home PT I will verify if ST is also coming out.  Currently on Plavix.      Relevant Medications   metoprolol succinate (TOPROL-XL) 25 MG 24 hr tablet     Respiratory   COPD with asthma ([place])    Does need to be on the Stiolto there was a little bit of confusion because she was given aAnoro in the hospital and they dispensed her what was left of the sample but they provided.  We discussed she can certainly use that for couple of days until she runs out.  It was only a 7-day sample.  But then after that she can start the [place].  I did go ahead and send over a new prescription to the pharmacy today      Relevant Medications   Tiotropium Bromide-Olodaterol (STIOLTO RESPIMAT) 2.5-2.5 MCG/ACT AERS   albuterol (VENTOLIN HFA) 108 (90 Base) MCG/ACT inhaler     Nervous and Auditory   BPPV (benign paroxysmal positional vertigo)    And consider referral to rehab if not improving they may be able to do just a couple of sessions of maneuvers.  if one of the home health physical therapist is trained we might able to get them to treat the vertigo as well.        Genitourinary   CKD stage G3a/A1, GFR 45-59 and albumin creatinine ratio <30 mg/g (HCC)    Lan to recheck renal function today.         Other   Vitamin D deficiency    Commend taking a vitamin D supplement.  Plan to recheck level in about 3 months.      Tobacco abuse   Other Visit Diagnoses     Nausea    -  Primary   Relevant Medications   promethazine (PHENERGAN) injection 25 mg (Completed)   Other Relevant Orders   COMPLETE METABOLIC PANEL WITH GFR (Completed)   Lipase (Completed)   CBC with Differential   Lower abdominal pain       Relevant Orders   Urinalysis with Culture Reflex   Dizziness           Dizziness-I suspect it is probably from her BPPV and this is triggering significant nausea.  We did give  her an injection of Phenergan here in the office, 4 mg and then sent a prescription to the pharmacy for Zofran ODT 4 mg to take every 8 hours as needed to try to push some fluids and make sure that she is hydrating well.  Lower abdominal pain-unclear etiology.  She has huge bruises on her abdomen from where she was given Lovenox while in the hospital.  She has no significant deep tenderness on exam though.  No rebound or guarding.  He says that her bowels are moving normally she has been taking a stool softener regularly but has not had much bowel movement but she also reports she has barely been eating.  We will check a urinalysis just to make sure that the urinary tract infection has cleared.  Nausea/vomiting-continue with protein and Ensure.  Prescription sent for Zofran.  Meds ordered this encounter  Medications   Tiotropium Bromide-Olodaterol (STIOLTO RESPIMAT) 2.5-2.5 MCG/ACT AERS    Sig: Inhale 2 puffs into the lungs daily.    Dispense:  12 g    Refill:  3   metoprolol succinate (TOPROL-XL) 25 MG 24 hr tablet    Sig: Take 1 tablet (25 mg total) by mouth daily.    Dispense:  30 tablet    Refill:  2   ondansetron (ZOFRAN-ODT) 4 MG disintegrating tablet    Sig: Take 1 tablet (4 mg total) by mouth every 8 (eight) hours as needed for nausea or vomiting.    Dispense:  30 tablet    Refill:  0    albuterol (VENTOLIN HFA) 108 (90 Base) MCG/ACT inhaler    Sig: Inhale 2 puffs into the lungs every 6 (six) hours as needed for wheezing or shortness of breath.    Dispense:  8 g    Refill:  1   promethazine (PHENERGAN) injection 25 mg    Follow-up: Return in about 2 weeks (around 09/16/2021) for nausea and dizziness .   I spent 45 minutes on the day of the encounter to include pre-visit record review, face-to-face time with the patient and post visit ordering of test.   Beatrice Lecher, MD

## 2021-09-03 DIAGNOSIS — Z48812 Encounter for surgical aftercare following surgery on the circulatory system: Secondary | ICD-10-CM | POA: Diagnosis not present

## 2021-09-03 DIAGNOSIS — M791 Myalgia, unspecified site: Secondary | ICD-10-CM | POA: Diagnosis not present

## 2021-09-03 DIAGNOSIS — Z720 Tobacco use: Secondary | ICD-10-CM | POA: Insufficient documentation

## 2021-09-03 LAB — CBC WITH DIFFERENTIAL/PLATELET
Absolute Monocytes: 757 cells/uL (ref 200–950)
Basophils Absolute: 61 cells/uL (ref 0–200)
Basophils Relative: 0.7 %
Eosinophils Absolute: 122 cells/uL (ref 15–500)
Eosinophils Relative: 1.4 %
HCT: 47.6 % — ABNORMAL HIGH (ref 35.0–45.0)
Hemoglobin: 16.1 g/dL — ABNORMAL HIGH (ref 11.7–15.5)
Lymphs Abs: 3358 cells/uL (ref 850–3900)
MCH: 31.3 pg (ref 27.0–33.0)
MCHC: 33.8 g/dL (ref 32.0–36.0)
MCV: 92.4 fL (ref 80.0–100.0)
MPV: 10.3 fL (ref 7.5–12.5)
Monocytes Relative: 8.7 %
Neutro Abs: 4402 cells/uL (ref 1500–7800)
Neutrophils Relative %: 50.6 %
Platelets: 399 10*3/uL (ref 140–400)
RBC: 5.15 10*6/uL — ABNORMAL HIGH (ref 3.80–5.10)
RDW: 12.6 % (ref 11.0–15.0)
Total Lymphocyte: 38.6 %
WBC: 8.7 10*3/uL (ref 3.8–10.8)

## 2021-09-03 LAB — COMPLETE METABOLIC PANEL WITH GFR
AG Ratio: 1.4 (calc) (ref 1.0–2.5)
ALT: 60 U/L — ABNORMAL HIGH (ref 6–29)
AST: 30 U/L (ref 10–35)
Albumin: 4.4 g/dL (ref 3.6–5.1)
Alkaline phosphatase (APISO): 55 U/L (ref 37–153)
BUN/Creatinine Ratio: 16 (calc) (ref 6–22)
BUN: 24 mg/dL (ref 7–25)
CO2: 25 mmol/L (ref 20–32)
Calcium: 10.8 mg/dL — ABNORMAL HIGH (ref 8.6–10.4)
Chloride: 99 mmol/L (ref 98–110)
Creat: 1.48 mg/dL — ABNORMAL HIGH (ref 0.60–0.95)
Globulin: 3.2 g/dL (calc) (ref 1.9–3.7)
Glucose, Bld: 90 mg/dL (ref 65–99)
Potassium: 4.9 mmol/L (ref 3.5–5.3)
Sodium: 135 mmol/L (ref 135–146)
Total Bilirubin: 0.5 mg/dL (ref 0.2–1.2)
Total Protein: 7.6 g/dL (ref 6.1–8.1)
eGFR: 34 mL/min/{1.73_m2} — ABNORMAL LOW (ref >=60)

## 2021-09-03 LAB — LIPASE: Lipase: 70 U/L — ABNORMAL HIGH (ref 7–60)

## 2021-09-03 NOTE — Progress Notes (Signed)
Called patient's daughter and let her know that she does look little dry on the blood work so just really encouraged her to drink fluids.  Her white blood cells are normal so no sign of systemic infection which is great.  One of the liver enzymes is just mildly elevated.  May again be just because she is not eating and she has not been drinking and hydrating well.  I definitely want to recheck that again in about a week or 2 to make sure that it comes back down to normal.  Electrolyte levels look good.  Hopefully the nausea medicine is helping.  Let us know if its not over the next couple of days.

## 2021-09-03 NOTE — Assessment & Plan Note (Signed)
Commend taking a vitamin D supplement.  Plan to recheck level in about 3 months.

## 2021-09-03 NOTE — Assessment & Plan Note (Addendum)
Persistent aphasia, receptive and expressive.  Continue with home PT I will verify if ST is also coming out.  Currently on Plavix.

## 2021-09-03 NOTE — Assessment & Plan Note (Signed)
Lan to recheck renal function today.

## 2021-09-03 NOTE — Assessment & Plan Note (Signed)
And consider referral to rehab if not improving they may be able to do just a couple of sessions of maneuvers.  if one of the home health physical therapist is trained we might able to get them to treat the vertigo as well.

## 2021-09-03 NOTE — Assessment & Plan Note (Addendum)
Pressure actually looks really good today.  Continue with the metoprolol but continue to hold the amlodipine.  If as she starts to feel better and appetite increases blood pressure starts going up then we can always add it back.

## 2021-09-04 DIAGNOSIS — R531 Weakness: Secondary | ICD-10-CM | POA: Diagnosis not present

## 2021-09-04 DIAGNOSIS — E785 Hyperlipidemia, unspecified: Secondary | ICD-10-CM | POA: Diagnosis not present

## 2021-09-04 DIAGNOSIS — I10 Essential (primary) hypertension: Secondary | ICD-10-CM | POA: Diagnosis not present

## 2021-09-04 DIAGNOSIS — I69398 Other sequelae of cerebral infarction: Secondary | ICD-10-CM | POA: Diagnosis not present

## 2021-09-04 DIAGNOSIS — I69319 Unspecified symptoms and signs involving cognitive functions following cerebral infarction: Secondary | ICD-10-CM | POA: Diagnosis not present

## 2021-09-04 DIAGNOSIS — Z7902 Long term (current) use of antithrombotics/antiplatelets: Secondary | ICD-10-CM | POA: Diagnosis not present

## 2021-09-04 DIAGNOSIS — N390 Urinary tract infection, site not specified: Secondary | ICD-10-CM | POA: Diagnosis not present

## 2021-09-04 DIAGNOSIS — R2681 Unsteadiness on feet: Secondary | ICD-10-CM | POA: Diagnosis not present

## 2021-09-04 DIAGNOSIS — I69320 Aphasia following cerebral infarction: Secondary | ICD-10-CM | POA: Diagnosis not present

## 2021-09-05 ENCOUNTER — Telehealth: Payer: Self-pay

## 2021-09-05 NOTE — Telephone Encounter (Signed)
Yes ok for PT. I feel like I already signed the forms for this.

## 2021-09-05 NOTE — Telephone Encounter (Signed)
[facility] called requesting for Home Health PT for patient starting 08/31/21. 2 x 4 for strength and balance.

## 2021-09-08 DIAGNOSIS — I10 Essential (primary) hypertension: Secondary | ICD-10-CM | POA: Diagnosis not present

## 2021-09-08 DIAGNOSIS — Z7902 Long term (current) use of antithrombotics/antiplatelets: Secondary | ICD-10-CM | POA: Diagnosis not present

## 2021-09-08 DIAGNOSIS — R2681 Unsteadiness on feet: Secondary | ICD-10-CM | POA: Diagnosis not present

## 2021-09-08 DIAGNOSIS — N390 Urinary tract infection, site not specified: Secondary | ICD-10-CM | POA: Diagnosis not present

## 2021-09-08 DIAGNOSIS — I69319 Unspecified symptoms and signs involving cognitive functions following cerebral infarction: Secondary | ICD-10-CM | POA: Diagnosis not present

## 2021-09-08 DIAGNOSIS — E785 Hyperlipidemia, unspecified: Secondary | ICD-10-CM | POA: Diagnosis not present

## 2021-09-08 DIAGNOSIS — I69320 Aphasia following cerebral infarction: Secondary | ICD-10-CM | POA: Diagnosis not present

## 2021-09-08 DIAGNOSIS — R531 Weakness: Secondary | ICD-10-CM | POA: Diagnosis not present

## 2021-09-08 DIAGNOSIS — I69398 Other sequelae of cerebral infarction: Secondary | ICD-10-CM | POA: Diagnosis not present

## 2021-09-10 DIAGNOSIS — E785 Hyperlipidemia, unspecified: Secondary | ICD-10-CM | POA: Diagnosis not present

## 2021-09-10 DIAGNOSIS — I69320 Aphasia following cerebral infarction: Secondary | ICD-10-CM | POA: Diagnosis not present

## 2021-09-10 DIAGNOSIS — I69319 Unspecified symptoms and signs involving cognitive functions following cerebral infarction: Secondary | ICD-10-CM | POA: Diagnosis not present

## 2021-09-10 DIAGNOSIS — N390 Urinary tract infection, site not specified: Secondary | ICD-10-CM | POA: Diagnosis not present

## 2021-09-10 DIAGNOSIS — I69398 Other sequelae of cerebral infarction: Secondary | ICD-10-CM | POA: Diagnosis not present

## 2021-09-10 DIAGNOSIS — R531 Weakness: Secondary | ICD-10-CM | POA: Diagnosis not present

## 2021-09-10 DIAGNOSIS — R2681 Unsteadiness on feet: Secondary | ICD-10-CM | POA: Diagnosis not present

## 2021-09-10 DIAGNOSIS — I10 Essential (primary) hypertension: Secondary | ICD-10-CM | POA: Diagnosis not present

## 2021-09-10 DIAGNOSIS — Z7902 Long term (current) use of antithrombotics/antiplatelets: Secondary | ICD-10-CM | POA: Diagnosis not present

## 2021-09-10 NOTE — Telephone Encounter (Signed)
[facility] PT with [facility] advised of verbal order.

## 2021-09-11 DIAGNOSIS — R531 Weakness: Secondary | ICD-10-CM | POA: Diagnosis not present

## 2021-09-11 DIAGNOSIS — I69320 Aphasia following cerebral infarction: Secondary | ICD-10-CM | POA: Diagnosis not present

## 2021-09-11 DIAGNOSIS — Z7902 Long term (current) use of antithrombotics/antiplatelets: Secondary | ICD-10-CM | POA: Diagnosis not present

## 2021-09-11 DIAGNOSIS — I69319 Unspecified symptoms and signs involving cognitive functions following cerebral infarction: Secondary | ICD-10-CM | POA: Diagnosis not present

## 2021-09-11 DIAGNOSIS — I69398 Other sequelae of cerebral infarction: Secondary | ICD-10-CM | POA: Diagnosis not present

## 2021-09-11 DIAGNOSIS — N390 Urinary tract infection, site not specified: Secondary | ICD-10-CM | POA: Diagnosis not present

## 2021-09-11 DIAGNOSIS — E785 Hyperlipidemia, unspecified: Secondary | ICD-10-CM | POA: Diagnosis not present

## 2021-09-11 DIAGNOSIS — R2681 Unsteadiness on feet: Secondary | ICD-10-CM | POA: Diagnosis not present

## 2021-09-11 DIAGNOSIS — I10 Essential (primary) hypertension: Secondary | ICD-10-CM | POA: Diagnosis not present

## 2021-09-12 DIAGNOSIS — Z7902 Long term (current) use of antithrombotics/antiplatelets: Secondary | ICD-10-CM | POA: Diagnosis not present

## 2021-09-12 DIAGNOSIS — R531 Weakness: Secondary | ICD-10-CM | POA: Diagnosis not present

## 2021-09-12 DIAGNOSIS — I69320 Aphasia following cerebral infarction: Secondary | ICD-10-CM | POA: Diagnosis not present

## 2021-09-12 DIAGNOSIS — N390 Urinary tract infection, site not specified: Secondary | ICD-10-CM | POA: Diagnosis not present

## 2021-09-12 DIAGNOSIS — I69398 Other sequelae of cerebral infarction: Secondary | ICD-10-CM | POA: Diagnosis not present

## 2021-09-12 DIAGNOSIS — I10 Essential (primary) hypertension: Secondary | ICD-10-CM | POA: Diagnosis not present

## 2021-09-12 DIAGNOSIS — I69319 Unspecified symptoms and signs involving cognitive functions following cerebral infarction: Secondary | ICD-10-CM | POA: Diagnosis not present

## 2021-09-12 DIAGNOSIS — R2681 Unsteadiness on feet: Secondary | ICD-10-CM | POA: Diagnosis not present

## 2021-09-12 DIAGNOSIS — E785 Hyperlipidemia, unspecified: Secondary | ICD-10-CM | POA: Diagnosis not present

## 2021-09-15 DIAGNOSIS — E785 Hyperlipidemia, unspecified: Secondary | ICD-10-CM | POA: Diagnosis not present

## 2021-09-15 DIAGNOSIS — R531 Weakness: Secondary | ICD-10-CM | POA: Diagnosis not present

## 2021-09-15 DIAGNOSIS — N390 Urinary tract infection, site not specified: Secondary | ICD-10-CM | POA: Diagnosis not present

## 2021-09-15 DIAGNOSIS — Z7902 Long term (current) use of antithrombotics/antiplatelets: Secondary | ICD-10-CM | POA: Diagnosis not present

## 2021-09-15 DIAGNOSIS — I69398 Other sequelae of cerebral infarction: Secondary | ICD-10-CM | POA: Diagnosis not present

## 2021-09-15 DIAGNOSIS — I69320 Aphasia following cerebral infarction: Secondary | ICD-10-CM | POA: Diagnosis not present

## 2021-09-15 DIAGNOSIS — I69319 Unspecified symptoms and signs involving cognitive functions following cerebral infarction: Secondary | ICD-10-CM | POA: Diagnosis not present

## 2021-09-15 DIAGNOSIS — R2681 Unsteadiness on feet: Secondary | ICD-10-CM | POA: Diagnosis not present

## 2021-09-15 DIAGNOSIS — I10 Essential (primary) hypertension: Secondary | ICD-10-CM | POA: Diagnosis not present

## 2021-09-16 ENCOUNTER — Other Ambulatory Visit: Payer: Self-pay

## 2021-09-16 ENCOUNTER — Ambulatory Visit (INDEPENDENT_AMBULATORY_CARE_PROVIDER_SITE_OTHER): Payer: Medicare Other | Admitting: Family Medicine

## 2021-09-16 ENCOUNTER — Encounter: Payer: Self-pay | Admitting: Family Medicine

## 2021-09-16 ENCOUNTER — Telehealth: Payer: Self-pay | Admitting: Family Medicine

## 2021-09-16 VITALS — BP 123/71 | HR 76 | Resp 16 | Ht 63.0 in | Wt 112.0 lb

## 2021-09-16 DIAGNOSIS — I10 Essential (primary) hypertension: Secondary | ICD-10-CM

## 2021-09-16 DIAGNOSIS — R300 Dysuria: Secondary | ICD-10-CM

## 2021-09-16 DIAGNOSIS — I69319 Unspecified symptoms and signs involving cognitive functions following cerebral infarction: Secondary | ICD-10-CM | POA: Diagnosis not present

## 2021-09-16 DIAGNOSIS — R748 Abnormal levels of other serum enzymes: Secondary | ICD-10-CM

## 2021-09-16 DIAGNOSIS — R110 Nausea: Secondary | ICD-10-CM

## 2021-09-16 DIAGNOSIS — N390 Urinary tract infection, site not specified: Secondary | ICD-10-CM | POA: Diagnosis not present

## 2021-09-16 DIAGNOSIS — Z7902 Long term (current) use of antithrombotics/antiplatelets: Secondary | ICD-10-CM | POA: Diagnosis not present

## 2021-09-16 DIAGNOSIS — R2681 Unsteadiness on feet: Secondary | ICD-10-CM | POA: Diagnosis not present

## 2021-09-16 DIAGNOSIS — E785 Hyperlipidemia, unspecified: Secondary | ICD-10-CM | POA: Diagnosis not present

## 2021-09-16 DIAGNOSIS — R531 Weakness: Secondary | ICD-10-CM | POA: Diagnosis not present

## 2021-09-16 DIAGNOSIS — I69320 Aphasia following cerebral infarction: Secondary | ICD-10-CM | POA: Diagnosis not present

## 2021-09-16 DIAGNOSIS — I69398 Other sequelae of cerebral infarction: Secondary | ICD-10-CM | POA: Diagnosis not present

## 2021-09-16 LAB — POCT URINALYSIS DIP (CLINITEK)
Bilirubin, UA: NEGATIVE
Blood, UA: NEGATIVE
Glucose, UA: NEGATIVE mg/dL
Ketones, POC UA: NEGATIVE mg/dL
Nitrite, UA: NEGATIVE
POC PROTEIN,UA: NEGATIVE
Spec Grav, UA: <=1.005 — AB (ref 1.010–1.025)
Urobilinogen, UA: 0.2 E.U./dL
pH, UA: 5.5 (ref 5.0–8.0)

## 2021-09-16 NOTE — Progress Notes (Signed)
Established Patient Office Visit  Subjective:  Patient ID: Erika Lynch, female    DOB: Mar 30, 1933  Age: 88 y.o. MRN: [id number]  CC:  Chief Complaint  Patient presents with   Follow-up    2 week. Patient complains of loss of appetite, weight loss and nausea.    Depression    Since CVA. Would like ot discuss if some depression since CVA is normal     HPI Erika Lynch presents for 2-week follow-up.  She had actually been seen in the hospital around January 8 for a stroke.  She did a hospital follow-up with me on January 31 and had complained of lower abdominal pain and nausea at that time.  PT including nursing and physical therapy have been coming out to the house.  We did do some follow-up labs at her hospital follow-up appointment on January 31 and it did indicate that she looked a little bit dry on her blood work.  And one of her liver enzymes was mildly elevated.  ALT was 60.  AST was normal and alkaline phosphatase was also normal but she had also not been eating or drinking very much either.  Says she just still feels nauseated all day.  No vomiting.  She has been trying to eat and she has been trying to drink 2 Ensure daily.  She is just been very anxious and very irritable.  He declined speech therapy this morning.  Down 18 lbs since last here per our scale but she was 121 lbs in the hospital.  Nursing has been tracking her blood pressures and they brought in a log today there have been a few blood pressures that are actually low in a few that are actually really good.  The blood pressure this morning was before she took her metoprolol.  Past Medical History:  Diagnosis Date   Cataract    Dyskinesia of esophagus    Hypertension    Kidney stone    Uterine cancer ([place])    Vertigo 07/16/2020    Past Surgical History:  Procedure Laterality Date   ABDOMINAL HYSTERECTOMY     Uterine cancer   BACK SURGERY     CATARACT EXTRACTION, BILATERAL      Family History  Problem  Relation Age of Onset   Heart attack Father    Stroke Father    Heart attack Brother    Heart attack Brother    Colon cancer Sister    Cancer Sister        lung   Breast cancer Other     Social History   Socioeconomic History   Marital status: Divorced    Spouse name: Not on file   Number of children: 4   Years of education: 8th    Highest education level: 8th grade  Occupational History   Occupation: Retired.     Employer: RETIRED    Comment: [employer]  Tobacco Use   Smoking status: Former    Types: Cigarettes    Start date: 08/10/2021   Smokeless tobacco: Never   Tobacco comments:    smokes cig . less than a half pack a week. Patient has not smoked in a few weeks   Vaping Use   Vaping Use: Never used  Substance and Sexual Activity   Alcohol use: No   Drug use: No   Sexual activity: Not Currently  Other Topics Concern   Not on file  Social History Narrative   3 living children: Erika Lynch,  Erika Lynch.  Daily caffeine use. Regular exercise everyday, walking and doing stretching. She looks to do puzzles, workbooks and adult coloring books.   Social Determinants of Health   Financial Resource Strain: Not on file  Food Insecurity: Not on file  Transportation Needs: Not on file  Physical Activity: Not on file  Stress: Not on file  Social Connections: Not on file  Intimate Partner Violence: Not on file    Outpatient Medications Prior to Visit  Medication Sig Dispense Refill   acetaminophen (TYLENOL) 325 MG tablet Take by mouth.     albuterol (VENTOLIN HFA) 108 (90 Base) MCG/ACT inhaler Inhale 2 puffs into the lungs every 6 (six) hours as needed for wheezing or shortness of breath. 8 g 1   atorvastatin (LIPITOR) 20 MG tablet Take 1 tablet (20 mg total) by mouth daily. 90 tablet 3   calcium carbonate (TUMS EX) 750 MG chewable tablet Chew 1 tablet by mouth daily.     Cholecalciferol 25 MCG (1000 UT) tablet Take by mouth.     clopidogrel (PLAVIX) 75 MG tablet Take 75  mg by mouth daily.     metoprolol succinate (TOPROL-XL) 25 MG 24 hr tablet Take 1 tablet (25 mg total) by mouth daily. 30 tablet 2   omeprazole (PRILOSEC) 40 MG capsule TAKE 1 CAPSULE EVERY DAY 90 capsule 3   ondansetron (ZOFRAN-ODT) 4 MG disintegrating tablet Take 1 tablet (4 mg total) by mouth every 8 (eight) hours as needed for nausea or vomiting. 30 tablet 0   Tiotropium Bromide-Olodaterol (STIOLTO RESPIMAT) 2.5-2.5 MCG/ACT AERS Inhale 2 puffs into the lungs daily. 12 g 3   cyanocobalamin 1000 MCG tablet Take by mouth.     Omega-3 Fatty Acids (FISH OIL) 1000 MG CAPS Take by mouth.     vitamin B-12 (CYANOCOBALAMIN) 50 MCG tablet Take 50 mcg by mouth daily.     No facility-administered medications prior to visit.    Allergies  Allergen Reactions   Other Other (See Comments)    Pollen-- sneezing   Sulfa Antibiotics Rash   Aspirin    Cisapride    Imipramine Hcl    Mometasone Swelling    Throat swelling    Penicillins Rash   Sulfonamide Derivatives Rash    ROS Review of Systems    Objective:    Physical Exam Constitutional:      Appearance: Normal appearance. She is well-developed.  HENT:     Head: Normocephalic and atraumatic.  Cardiovascular:     Rate and Rhythm: Normal rate and regular rhythm.     Heart sounds: Normal heart sounds.  Pulmonary:     Effort: Pulmonary effort is normal.     Breath sounds: Normal breath sounds.  Skin:    General: Skin is warm and dry.  Neurological:     Mental Status: She is alert and oriented to person, place, and time.  Psychiatric:        Behavior: Behavior normal.    BP 123/71    Pulse 76    Resp 16    Ht '5\' 3"'  (1.6 m)    Wt 112 lb (50.8 kg)    SpO2 97%    BMI 19.84 kg/m  Wt Readings from Last 3 Encounters:  09/16/21 112 lb (50.8 kg)  09/02/21 130 lb (59 kg)  06/09/21 126 lb (57.2 kg)     There are no preventive care reminders to display for this patient.  There are no preventive care reminders to display for this  patient.  Lab Results  Component Value Date   TSH 2.19 06/11/2020   Lab Results  Component Value Date   WBC 8.7 09/02/2021   HGB 16.1 (H) 09/02/2021   HCT 47.6 (H) 09/02/2021   MCV 92.4 09/02/2021   PLT 399 09/02/2021   Lab Results  Component Value Date   NA 135 09/02/2021   K 4.9 09/02/2021   CO2 25 09/02/2021   GLUCOSE 90 09/02/2021   BUN 24 09/02/2021   CREATININE 1.48 (H) 09/02/2021   BILITOT 0.5 09/02/2021   ALKPHOS 74 11/30/2019   AST 30 09/02/2021   ALT 60 (H) 09/02/2021   PROT 7.6 09/02/2021   ALBUMIN 4.2 11/30/2019   CALCIUM 10.8 (H) 09/02/2021   EGFR 34 (L) 09/02/2021   Lab Results  Component Value Date   CHOL 266 (H) 12/02/2020   Lab Results  Component Value Date   HDL 51 12/02/2020   Lab Results  Component Value Date   LDLCALC 177 (H) 12/02/2020   Lab Results  Component Value Date   TRIG 215 (H) 12/02/2020   Lab Results  Component Value Date   CHOLHDL 5.2 (H) 12/02/2020   Lab Results  Component Value Date   HGBA1C 5.6 06/09/2021      Assessment & Plan:   Problem List Items Addressed This Visit       Cardiovascular and Mediastinum   Essential hypertension    Pressure looks fantastic today.      Other Visit Diagnoses     Nausea    -  Primary   Relevant Orders   CBC with Differential/Platelet   COMPLETE METABOLIC PANEL WITH GFR   Hepatitis, Acute   CT Abdomen Pelvis W Contrast   Elevated liver enzymes       Relevant Orders   CBC with Differential/Platelet   COMPLETE METABOLIC PANEL WITH GFR   Hepatitis, Acute   CT Abdomen Pelvis W Contrast   Dysuria       Relevant Orders   POCT URINALYSIS DIP (CLINITEK) (Completed)   Urine Culture      Nausea, abnormal weight loss and elevated ALT.I am concerned that she may have some other type of underlying process going on.  We did discuss A CT abdomen pelvis for further work-up.  We will try to get that scheduled tomorrow if at all possible  Elevated liver enzymes we will repeat  that today to see if it is trending down or if it still elevated and may indicate some other underlying issue going on.  Nausea-she does still have some Zofran at home encouraged her to use as needed.  I am still concerned that she is nauseated and that that she is losing weight even though she is trying to eat.  If CT is normal but would like to refer her to GI for further work-up.  She is also on a PPI.   No orders of the defined types were placed in this encounter.   Follow-up: Return in about 3 weeks (around 10/07/2021) for Follow up.    Beatrice Lecher, MD

## 2021-09-16 NOTE — Patient Instructions (Addendum)
Please cut amlodipine in half. Take half a tab daily for the next 2 weeks.  Happy to look at those blood pressures afterwards and then we can make an adjustment again if needed.  With persistent nausea and weight loss and elevated liver enzymes I am concerned that there may be something else going on and recommend CT of the abdomen pelvis for further work-up to rule out mass etc.  We will call with results once available we will try to get her scheduled tomorrow if at all possible.  I also wonder if she could have some element of vertigo that actually could be contributing to her nausea as she has history of recurrent BPPV.  We can see if the physical therapist through home health could add some vestibular exercises to their regimen.,  Abnormal weight loss and elevated liver enzymes-recommend CT of abdomen and pelvis for further work-up recommend further work-up with CT abdomen and pelvis.  We will try to get her scheduled for tomorrow at all possible want to rule out any type of mass etc.  Also we will repeat her liver enzymes today to make sure that they are not elevated.

## 2021-09-16 NOTE — Assessment & Plan Note (Signed)
Pressure looks fantastic today. 

## 2021-09-16 NOTE — Telephone Encounter (Signed)
Left detailed message on patient's voicemail and to call back if patient has any other questions.

## 2021-09-16 NOTE — Telephone Encounter (Signed)
Pt's daughter called and wanted to know the get instructions on how to properly plan for her appt tomorrow. She wants to make sure there is nothing she has to do tonight in preparation.

## 2021-09-17 ENCOUNTER — Telehealth: Payer: Self-pay | Admitting: Family Medicine

## 2021-09-17 LAB — CBC WITH DIFFERENTIAL/PLATELET
Absolute Monocytes: 722 cells/uL (ref 200–950)
Basophils Absolute: 101 cells/uL (ref 0–200)
Basophils Relative: 1.2 %
Eosinophils Absolute: 487 cells/uL (ref 15–500)
Eosinophils Relative: 5.8 %
HCT: 41.6 % (ref 35.0–45.0)
Hemoglobin: 13.9 g/dL (ref 11.7–15.5)
Lymphs Abs: 3116 cells/uL (ref 850–3900)
MCH: 31.2 pg (ref 27.0–33.0)
MCHC: 33.4 g/dL (ref 32.0–36.0)
MCV: 93.3 fL (ref 80.0–100.0)
MPV: 10.3 fL (ref 7.5–12.5)
Monocytes Relative: 8.6 %
Neutro Abs: 3973 cells/uL (ref 1500–7800)
Neutrophils Relative %: 47.3 %
Platelets: 354 10*3/uL (ref 140–400)
RBC: 4.46 10*6/uL (ref 3.80–5.10)
RDW: 12.3 % (ref 11.0–15.0)
Total Lymphocyte: 37.1 %
WBC: 8.4 10*3/uL (ref 3.8–10.8)

## 2021-09-17 LAB — HEPATITIS PANEL, ACUTE
Hep A IgM: NONREACTIVE
Hep B C IgM: NONREACTIVE
Hepatitis B Surface Ag: NONREACTIVE
Hepatitis C Ab: NONREACTIVE
SIGNAL TO CUT-OFF: 0.05 (ref <1.00)

## 2021-09-17 LAB — COMPLETE METABOLIC PANEL WITH GFR
AG Ratio: 1.1 (calc) (ref 1.0–2.5)
ALT: 16 U/L (ref 6–29)
AST: 20 U/L (ref 10–35)
Albumin: 3.7 g/dL (ref 3.6–5.1)
Alkaline phosphatase (APISO): 47 U/L (ref 37–153)
BUN/Creatinine Ratio: 23 (calc) — ABNORMAL HIGH (ref 6–22)
BUN: 29 mg/dL — ABNORMAL HIGH (ref 7–25)
CO2: 24 mmol/L (ref 20–32)
Calcium: 10.4 mg/dL (ref 8.6–10.4)
Chloride: 104 mmol/L (ref 98–110)
Creat: 1.24 mg/dL — ABNORMAL HIGH (ref 0.60–0.95)
Globulin: 3.3 g/dL (calc) (ref 1.9–3.7)
Glucose, Bld: 121 mg/dL — ABNORMAL HIGH (ref 65–99)
Potassium: 4.2 mmol/L (ref 3.5–5.3)
Sodium: 139 mmol/L (ref 135–146)
Total Bilirubin: 0.4 mg/dL (ref 0.2–1.2)
Total Protein: 7 g/dL (ref 6.1–8.1)
eGFR: 42 mL/min/{1.73_m2} — ABNORMAL LOW (ref >=60)

## 2021-09-17 NOTE — Telephone Encounter (Signed)
Call her home health and see if we can get an order added on for them to do some vestibular rehab with the physical therapist I do feel like she is experiencing some positional vertigo that is making her feel dizzy.

## 2021-09-17 NOTE — Progress Notes (Signed)
Please call [name] and let her know that the kidney function is still elevated but it actually looks a little better than it did 2 weeks ago which is good.  She still needs to drink more water.  Her calcium level is also back to normal and her liver function is back to normal which is all very reassuring.  Hemoglobin also looks good.  I think it was just falsely elevated couple of weeks ago.  It is a little closer to where it was.  But just look out for any blood in the urine or stool if they notice anything like that then please let me know especially with her being on a blood thinner.

## 2021-09-18 ENCOUNTER — Telehealth: Payer: Self-pay

## 2021-09-18 LAB — URINE CULTURE
MICRO NUMBER:: 13009220
SPECIMEN QUALITY:: ADEQUATE

## 2021-09-18 NOTE — Progress Notes (Signed)
Call patient: Urine culture is negative looks good.

## 2021-09-18 NOTE — Telephone Encounter (Signed)
Patients daughter ( Helen) advised of message and verbalized understanding.  

## 2021-09-18 NOTE — Telephone Encounter (Signed)
Left a message for [facility] to call back for added PT for vertigo.

## 2021-09-18 NOTE — Telephone Encounter (Signed)
Okay, have her cut the metoprolol in half we will just have to watch the heart rate.  But if she can check the blood pressure and pulse every other day for the next week or 2 that would be great and let me know what it looks like.  Also make sure drinking enough water.  She can eat a few salty foods to!

## 2021-09-18 NOTE — Telephone Encounter (Signed)
Patient's daughter called stating patient was told at her office visit to cut her amlodipine in half. Patient's daughter stated she went thorough patient's pill box and realized that patient is not taking Amlodipine and she has been off of it for a while.

## 2021-09-22 ENCOUNTER — Ambulatory Visit (INDEPENDENT_AMBULATORY_CARE_PROVIDER_SITE_OTHER): Payer: Medicare Other

## 2021-09-22 ENCOUNTER — Other Ambulatory Visit: Payer: Self-pay

## 2021-09-22 DIAGNOSIS — R1030 Lower abdominal pain, unspecified: Secondary | ICD-10-CM

## 2021-09-22 DIAGNOSIS — R634 Abnormal weight loss: Secondary | ICD-10-CM | POA: Diagnosis not present

## 2021-09-22 DIAGNOSIS — R110 Nausea: Secondary | ICD-10-CM | POA: Diagnosis not present

## 2021-09-22 DIAGNOSIS — I700 Atherosclerosis of aorta: Secondary | ICD-10-CM | POA: Diagnosis not present

## 2021-09-22 DIAGNOSIS — R109 Unspecified abdominal pain: Secondary | ICD-10-CM | POA: Diagnosis not present

## 2021-09-22 DIAGNOSIS — R748 Abnormal levels of other serum enzymes: Secondary | ICD-10-CM

## 2021-09-22 MED ORDER — IOHEXOL 300 MG/ML  SOLN
100.0000 mL | Freq: Once | INTRAMUSCULAR | Status: AC | PRN
  Administered 2021-09-22: 70 mL via INTRAVENOUS

## 2021-09-22 NOTE — Progress Notes (Signed)
Call patient's daughter [name], the liver and gallbladder look good.  Pancreas and spleen are also normal.  A little bit of scar tissue in her right kidney and some cysts in both kidneys.  But nothing new or different.  Normal appendix.  No inflammatory process going on in the colon or gut.  No abnormally enlarged lymph nodes which is great.  If she still having a lot of nausea even with using the nausea medicine that I think the next step would be to send her to GI for further work-up.  I am also hoping that backing off on her blood pressure medicine might help as well.  Please let us know if she has a for preference for GI referral

## 2021-09-23 DIAGNOSIS — Z87891 Personal history of nicotine dependence: Secondary | ICD-10-CM | POA: Diagnosis not present

## 2021-09-23 DIAGNOSIS — I10 Essential (primary) hypertension: Secondary | ICD-10-CM | POA: Diagnosis not present

## 2021-09-23 DIAGNOSIS — I6930 Unspecified sequelae of cerebral infarction: Secondary | ICD-10-CM | POA: Diagnosis not present

## 2021-09-23 DIAGNOSIS — E782 Mixed hyperlipidemia: Secondary | ICD-10-CM | POA: Diagnosis not present

## 2021-09-23 DIAGNOSIS — R7303 Prediabetes: Secondary | ICD-10-CM | POA: Diagnosis not present

## 2021-09-23 DIAGNOSIS — R4701 Aphasia: Secondary | ICD-10-CM | POA: Diagnosis not present

## 2021-09-24 DIAGNOSIS — R531 Weakness: Secondary | ICD-10-CM | POA: Diagnosis not present

## 2021-09-24 DIAGNOSIS — Z7902 Long term (current) use of antithrombotics/antiplatelets: Secondary | ICD-10-CM | POA: Diagnosis not present

## 2021-09-24 DIAGNOSIS — E785 Hyperlipidemia, unspecified: Secondary | ICD-10-CM | POA: Diagnosis not present

## 2021-09-24 DIAGNOSIS — I69319 Unspecified symptoms and signs involving cognitive functions following cerebral infarction: Secondary | ICD-10-CM | POA: Diagnosis not present

## 2021-09-24 DIAGNOSIS — N390 Urinary tract infection, site not specified: Secondary | ICD-10-CM | POA: Diagnosis not present

## 2021-09-24 DIAGNOSIS — R2681 Unsteadiness on feet: Secondary | ICD-10-CM | POA: Diagnosis not present

## 2021-09-24 DIAGNOSIS — I10 Essential (primary) hypertension: Secondary | ICD-10-CM | POA: Diagnosis not present

## 2021-09-24 DIAGNOSIS — I69320 Aphasia following cerebral infarction: Secondary | ICD-10-CM | POA: Diagnosis not present

## 2021-09-24 DIAGNOSIS — I69398 Other sequelae of cerebral infarction: Secondary | ICD-10-CM | POA: Diagnosis not present

## 2021-09-24 MED ORDER — ESCITALOPRAM OXALATE 5 MG PO TABS: 5.0000 mg | ORAL_TABLET | Freq: Every day | ORAL | 1 refills | Status: DC

## 2021-09-24 NOTE — Progress Notes (Signed)
order placed.  We will go ahead and send a prescription for Lexapro just a really low dose to see if this helps with her anxiety.  It does take a couple of weeks to start kicking in and it does not work super quickly but is also not habit-forming and should not cause any sedation.

## 2021-09-24 NOTE — Progress Notes (Unsigned)
Orders Placed This Encounter  Procedures   Ambulatory referral to Gastroenterology    Referral Priority:   Routine    Referral Type:   Consultation    Referral Reason:   Specialty Services Required    Number of Visits Requested:   1     order placed.  We will go ahead and send a prescription for Lexapro just a really low dose to see if this helps with her anxiety.  It does take a couple of weeks to start kicking in and it does not work super quickly but is also not habit-forming and should not cause any sedation.

## 2021-09-26 DIAGNOSIS — I69319 Unspecified symptoms and signs involving cognitive functions following cerebral infarction: Secondary | ICD-10-CM | POA: Diagnosis not present

## 2021-09-26 DIAGNOSIS — E785 Hyperlipidemia, unspecified: Secondary | ICD-10-CM | POA: Diagnosis not present

## 2021-09-26 DIAGNOSIS — I10 Essential (primary) hypertension: Secondary | ICD-10-CM | POA: Diagnosis not present

## 2021-09-26 DIAGNOSIS — R531 Weakness: Secondary | ICD-10-CM | POA: Diagnosis not present

## 2021-09-26 DIAGNOSIS — N390 Urinary tract infection, site not specified: Secondary | ICD-10-CM | POA: Diagnosis not present

## 2021-09-26 DIAGNOSIS — I69320 Aphasia following cerebral infarction: Secondary | ICD-10-CM | POA: Diagnosis not present

## 2021-09-26 DIAGNOSIS — Z7902 Long term (current) use of antithrombotics/antiplatelets: Secondary | ICD-10-CM | POA: Diagnosis not present

## 2021-09-26 DIAGNOSIS — R2681 Unsteadiness on feet: Secondary | ICD-10-CM | POA: Diagnosis not present

## 2021-09-26 DIAGNOSIS — I69398 Other sequelae of cerebral infarction: Secondary | ICD-10-CM | POA: Diagnosis not present

## 2021-09-26 NOTE — Telephone Encounter (Signed)
Inkia with [facility] advised of verbal order for PT.

## 2021-09-29 DIAGNOSIS — I69320 Aphasia following cerebral infarction: Secondary | ICD-10-CM | POA: Diagnosis not present

## 2021-09-29 DIAGNOSIS — N390 Urinary tract infection, site not specified: Secondary | ICD-10-CM | POA: Diagnosis not present

## 2021-09-29 DIAGNOSIS — R2681 Unsteadiness on feet: Secondary | ICD-10-CM | POA: Diagnosis not present

## 2021-09-29 DIAGNOSIS — R531 Weakness: Secondary | ICD-10-CM | POA: Diagnosis not present

## 2021-09-29 DIAGNOSIS — I10 Essential (primary) hypertension: Secondary | ICD-10-CM | POA: Diagnosis not present

## 2021-09-29 DIAGNOSIS — Z7902 Long term (current) use of antithrombotics/antiplatelets: Secondary | ICD-10-CM | POA: Diagnosis not present

## 2021-09-29 DIAGNOSIS — I69398 Other sequelae of cerebral infarction: Secondary | ICD-10-CM | POA: Diagnosis not present

## 2021-09-29 DIAGNOSIS — E785 Hyperlipidemia, unspecified: Secondary | ICD-10-CM | POA: Diagnosis not present

## 2021-09-29 DIAGNOSIS — I69319 Unspecified symptoms and signs involving cognitive functions following cerebral infarction: Secondary | ICD-10-CM | POA: Diagnosis not present

## 2021-09-30 DIAGNOSIS — I69319 Unspecified symptoms and signs involving cognitive functions following cerebral infarction: Secondary | ICD-10-CM | POA: Diagnosis not present

## 2021-09-30 DIAGNOSIS — I69398 Other sequelae of cerebral infarction: Secondary | ICD-10-CM | POA: Diagnosis not present

## 2021-09-30 DIAGNOSIS — R531 Weakness: Secondary | ICD-10-CM | POA: Diagnosis not present

## 2021-09-30 DIAGNOSIS — I10 Essential (primary) hypertension: Secondary | ICD-10-CM | POA: Diagnosis not present

## 2021-09-30 DIAGNOSIS — R2681 Unsteadiness on feet: Secondary | ICD-10-CM | POA: Diagnosis not present

## 2021-09-30 DIAGNOSIS — N390 Urinary tract infection, site not specified: Secondary | ICD-10-CM | POA: Diagnosis not present

## 2021-09-30 DIAGNOSIS — Z7902 Long term (current) use of antithrombotics/antiplatelets: Secondary | ICD-10-CM | POA: Diagnosis not present

## 2021-09-30 DIAGNOSIS — I69320 Aphasia following cerebral infarction: Secondary | ICD-10-CM | POA: Diagnosis not present

## 2021-09-30 DIAGNOSIS — E785 Hyperlipidemia, unspecified: Secondary | ICD-10-CM | POA: Diagnosis not present

## 2021-10-02 DIAGNOSIS — R2681 Unsteadiness on feet: Secondary | ICD-10-CM | POA: Diagnosis not present

## 2021-10-02 DIAGNOSIS — I69320 Aphasia following cerebral infarction: Secondary | ICD-10-CM | POA: Diagnosis not present

## 2021-10-02 DIAGNOSIS — I10 Essential (primary) hypertension: Secondary | ICD-10-CM | POA: Diagnosis not present

## 2021-10-02 DIAGNOSIS — E785 Hyperlipidemia, unspecified: Secondary | ICD-10-CM | POA: Diagnosis not present

## 2021-10-02 DIAGNOSIS — R531 Weakness: Secondary | ICD-10-CM | POA: Diagnosis not present

## 2021-10-02 DIAGNOSIS — I69319 Unspecified symptoms and signs involving cognitive functions following cerebral infarction: Secondary | ICD-10-CM | POA: Diagnosis not present

## 2021-10-02 DIAGNOSIS — Z7902 Long term (current) use of antithrombotics/antiplatelets: Secondary | ICD-10-CM | POA: Diagnosis not present

## 2021-10-02 DIAGNOSIS — N390 Urinary tract infection, site not specified: Secondary | ICD-10-CM | POA: Diagnosis not present

## 2021-10-02 DIAGNOSIS — I69398 Other sequelae of cerebral infarction: Secondary | ICD-10-CM | POA: Diagnosis not present

## 2021-10-03 DIAGNOSIS — I69319 Unspecified symptoms and signs involving cognitive functions following cerebral infarction: Secondary | ICD-10-CM | POA: Diagnosis not present

## 2021-10-03 DIAGNOSIS — N390 Urinary tract infection, site not specified: Secondary | ICD-10-CM | POA: Diagnosis not present

## 2021-10-03 DIAGNOSIS — I498 Other specified cardiac arrhythmias: Secondary | ICD-10-CM | POA: Diagnosis not present

## 2021-10-03 DIAGNOSIS — Z8673 Personal history of transient ischemic attack (TIA), and cerebral infarction without residual deficits: Secondary | ICD-10-CM | POA: Diagnosis not present

## 2021-10-03 DIAGNOSIS — I10 Essential (primary) hypertension: Secondary | ICD-10-CM | POA: Diagnosis not present

## 2021-10-03 DIAGNOSIS — R072 Precordial pain: Secondary | ICD-10-CM | POA: Diagnosis not present

## 2021-10-03 DIAGNOSIS — E785 Hyperlipidemia, unspecified: Secondary | ICD-10-CM | POA: Diagnosis not present

## 2021-10-03 DIAGNOSIS — R2681 Unsteadiness on feet: Secondary | ICD-10-CM | POA: Diagnosis not present

## 2021-10-03 DIAGNOSIS — K229 Disease of esophagus, unspecified: Secondary | ICD-10-CM | POA: Diagnosis not present

## 2021-10-03 DIAGNOSIS — I69398 Other sequelae of cerebral infarction: Secondary | ICD-10-CM | POA: Diagnosis not present

## 2021-10-03 DIAGNOSIS — I69320 Aphasia following cerebral infarction: Secondary | ICD-10-CM | POA: Diagnosis not present

## 2021-10-03 DIAGNOSIS — R531 Weakness: Secondary | ICD-10-CM | POA: Diagnosis not present

## 2021-10-03 DIAGNOSIS — Z7902 Long term (current) use of antithrombotics/antiplatelets: Secondary | ICD-10-CM | POA: Diagnosis not present

## 2021-10-06 DIAGNOSIS — Z7902 Long term (current) use of antithrombotics/antiplatelets: Secondary | ICD-10-CM | POA: Diagnosis not present

## 2021-10-06 DIAGNOSIS — R2681 Unsteadiness on feet: Secondary | ICD-10-CM | POA: Diagnosis not present

## 2021-10-06 DIAGNOSIS — N390 Urinary tract infection, site not specified: Secondary | ICD-10-CM | POA: Diagnosis not present

## 2021-10-06 DIAGNOSIS — I69320 Aphasia following cerebral infarction: Secondary | ICD-10-CM | POA: Diagnosis not present

## 2021-10-06 DIAGNOSIS — I69398 Other sequelae of cerebral infarction: Secondary | ICD-10-CM | POA: Diagnosis not present

## 2021-10-06 DIAGNOSIS — E785 Hyperlipidemia, unspecified: Secondary | ICD-10-CM | POA: Diagnosis not present

## 2021-10-06 DIAGNOSIS — I69319 Unspecified symptoms and signs involving cognitive functions following cerebral infarction: Secondary | ICD-10-CM | POA: Diagnosis not present

## 2021-10-06 DIAGNOSIS — R531 Weakness: Secondary | ICD-10-CM | POA: Diagnosis not present

## 2021-10-06 DIAGNOSIS — I10 Essential (primary) hypertension: Secondary | ICD-10-CM | POA: Diagnosis not present

## 2021-10-07 ENCOUNTER — Ambulatory Visit: Payer: Medicare HMO | Admitting: Family Medicine

## 2021-10-07 DIAGNOSIS — I69319 Unspecified symptoms and signs involving cognitive functions following cerebral infarction: Secondary | ICD-10-CM | POA: Diagnosis not present

## 2021-10-07 DIAGNOSIS — I69398 Other sequelae of cerebral infarction: Secondary | ICD-10-CM | POA: Diagnosis not present

## 2021-10-07 DIAGNOSIS — I69320 Aphasia following cerebral infarction: Secondary | ICD-10-CM | POA: Diagnosis not present

## 2021-10-07 DIAGNOSIS — R531 Weakness: Secondary | ICD-10-CM | POA: Diagnosis not present

## 2021-10-07 DIAGNOSIS — R2681 Unsteadiness on feet: Secondary | ICD-10-CM | POA: Diagnosis not present

## 2021-10-07 DIAGNOSIS — N390 Urinary tract infection, site not specified: Secondary | ICD-10-CM | POA: Diagnosis not present

## 2021-10-07 DIAGNOSIS — I10 Essential (primary) hypertension: Secondary | ICD-10-CM | POA: Diagnosis not present

## 2021-10-07 DIAGNOSIS — Z7902 Long term (current) use of antithrombotics/antiplatelets: Secondary | ICD-10-CM | POA: Diagnosis not present

## 2021-10-07 DIAGNOSIS — E785 Hyperlipidemia, unspecified: Secondary | ICD-10-CM | POA: Diagnosis not present

## 2021-10-09 ENCOUNTER — Encounter: Payer: Self-pay | Admitting: Family Medicine

## 2021-10-09 ENCOUNTER — Ambulatory Visit (INDEPENDENT_AMBULATORY_CARE_PROVIDER_SITE_OTHER): Payer: Medicare Other | Admitting: Family Medicine

## 2021-10-09 ENCOUNTER — Other Ambulatory Visit: Payer: Self-pay

## 2021-10-09 VITALS — BP 139/64 | HR 72 | Resp 16 | Ht 63.0 in | Wt 118.0 lb

## 2021-10-09 DIAGNOSIS — I10 Essential (primary) hypertension: Secondary | ICD-10-CM

## 2021-10-09 DIAGNOSIS — R110 Nausea: Secondary | ICD-10-CM | POA: Diagnosis not present

## 2021-10-09 DIAGNOSIS — I63412 Cerebral infarction due to embolism of left middle cerebral artery: Secondary | ICD-10-CM

## 2021-10-09 DIAGNOSIS — E559 Vitamin D deficiency, unspecified: Secondary | ICD-10-CM | POA: Diagnosis not present

## 2021-10-09 DIAGNOSIS — R42 Dizziness and giddiness: Secondary | ICD-10-CM

## 2021-10-09 DIAGNOSIS — F4329 Adjustment disorder with other symptoms: Secondary | ICD-10-CM | POA: Insufficient documentation

## 2021-10-09 MED ORDER — DULOXETINE HCL 20 MG PO CPEP: 20.0000 mg | ORAL_CAPSULE | Freq: Every day | ORAL | 0 refills | Status: DC

## 2021-10-09 MED ORDER — MECLIZINE HCL 25 MG PO TABS: 25.0000 mg | ORAL_TABLET | Freq: Three times a day (TID) | ORAL | 1 refills | Status: DC | PRN

## 2021-10-09 MED ORDER — CLOPIDOGREL BISULFATE 75 MG PO TABS
75.0000 mg | ORAL_TABLET | Freq: Every day | ORAL | 3 refills | Status: DC
Start: 2021-10-09 — End: 2022-09-18

## 2021-10-09 MED ORDER — ONDANSETRON 4 MG PO TBDP: 4.0000 mg | ORAL_TABLET | Freq: Three times a day (TID) | ORAL | 0 refills | Status: DC | PRN

## 2021-10-09 MED ORDER — OMEPRAZOLE 40 MG PO CPDR: 40.0000 mg | DELAYED_RELEASE_CAPSULE | Freq: Every day | ORAL | 3 refills | Status: DC

## 2021-10-09 NOTE — Assessment & Plan Note (Signed)
Pressure is acceptable today based on age.  We will just continue to monitor carefully off of the amlodipine. ?

## 2021-10-09 NOTE — Progress Notes (Signed)
Established Patient Office Visit  Subjective:  Patient ID: HELAINE YACKEL, female    DOB: [birth year]  Age: 88 y.o. MRN: [id number]  CC:  Chief Complaint  Patient presents with   Hypertension    Follow up    Discuss Medication    Patient stopped Lexapro due to insomnia and increased anxiety. Patient would like to discuss a different medication.     HPI ANTWONETTE FELIZ presents for   Hypertension- Pt denies chest pain, SOB, dizziness, or heart palpitations.  Taking meds as directed w/o problems.  Denies medication side effects.    Impaired fasting glucose-no increased thirst or urination. No symptoms consistent with hypoglycemia.  Nausea-her nausea is actually much better overall she still does not have much of an appetite but she at least has been able to eat some.  In fact her weight is up about 6 pounds since she was here about 3-1/2 weeks ago.  She feels like her bowels are moving normally.  Though she does occasionally take something to help her go.  Did have an episode last week where she suddenly felt really shaky.  She checked her blood pressure and it was 180/90 and felt her heart pounding.  It eventually resolved but then the next morning she felt similar symptoms again and at that point they called the cardiologist and then got in the following day.  They are scheduling her for a 30-day heart monitor as well as a stress test   Past Medical History:  Diagnosis Date   Cataract    Dyskinesia of esophagus    Hypertension    Kidney stone    Uterine cancer ([place])    Vertigo 07/16/2020    Past Surgical History:  Procedure Laterality Date   ABDOMINAL HYSTERECTOMY     Uterine cancer   BACK SURGERY     CATARACT EXTRACTION, BILATERAL      Family History  Problem Relation Age of Onset   Heart attack Father    Stroke Father    Heart attack Brother    Heart attack Brother    Colon cancer Sister    Cancer Sister        lung   Breast cancer Other     Social History    Socioeconomic History   Marital status: Divorced    Spouse name: Not on file   Number of children: 4   Years of education: 8th    Highest education level: 8th grade  Occupational History   Occupation: Retired.     Employer: RETIRED    Comment: [employer]  Tobacco Use   Smoking status: Former    Types: Cigarettes    Start date: 08/10/2021   Smokeless tobacco: Never   Tobacco comments:    smokes cig . less than a half pack a week. Patient has not smoked in a few weeks   Vaping Use   Vaping Use: Never used  Substance and Sexual Activity   Alcohol use: No   Drug use: No   Sexual activity: Not Currently  Other Topics Concern   Not on file  Social History Narrative   3 living children: [name], Danny.  Daily caffeine use. Regular exercise everyday, walking and doing stretching. She looks to do puzzles, workbooks and adult coloring books.   Social Determinants of Health   Financial Resource Strain: Not on file  Food Insecurity: Not on file  Transportation Needs: Not on file  Physical Activity: Not on file  Stress:  Not on file  Social Connections: Not on file  Intimate Partner Violence: Not on file    Outpatient Medications Prior to Visit  Medication Sig Dispense Refill   acetaminophen (TYLENOL) 325 MG tablet Take by mouth.     albuterol (VENTOLIN HFA) 108 (90 Base) MCG/ACT inhaler Inhale 2 puffs into the lungs every 6 (six) hours as needed for wheezing or shortness of breath. 8 g 1   atorvastatin (LIPITOR) 20 MG tablet Take 1 tablet (20 mg total) by mouth daily. 90 tablet 3   calcium carbonate (TUMS EX) 750 MG chewable tablet Chew 1 tablet by mouth daily.     Cholecalciferol 25 MCG (1000 UT) tablet Take by mouth.     metoprolol succinate (TOPROL-XL) 25 MG 24 hr tablet Take 1 tablet (25 mg total) by mouth daily. 30 tablet 2   Tiotropium Bromide-Olodaterol (STIOLTO RESPIMAT) 2.5-2.5 MCG/ACT AERS Inhale 2 puffs into the lungs daily. 12 g 3   clopidogrel (PLAVIX) 75 MG  tablet Take 75 mg by mouth daily.     meclizine (ANTIVERT) 25 MG tablet Take 25 mg by mouth 3 (three) times daily as needed for dizziness.     omeprazole (PRILOSEC) 40 MG capsule TAKE 1 CAPSULE EVERY DAY 90 capsule 3   ondansetron (ZOFRAN-ODT) 4 MG disintegrating tablet Take 1 tablet (4 mg total) by mouth every 8 (eight) hours as needed for nausea or vomiting. 30 tablet 0   escitalopram (LEXAPRO) 5 MG tablet Take 1 tablet (5 mg total) by mouth daily. 90 tablet 1   No facility-administered medications prior to visit.    Allergies  Allergen Reactions   Other Other (See Comments)    Pollen-- sneezing   Sulfa Antibiotics Rash   Aspirin    Cisapride    Imipramine Hcl    Lexapro [Escitalopram] Other (See Comments)    Insomnia, increased anxiety   Mometasone Swelling    Throat swelling    Penicillins Rash   Sulfonamide Derivatives Rash    ROS Review of Systems    Objective:    Physical Exam Constitutional:      Appearance: Normal appearance. She is well-developed.  HENT:     Head: Normocephalic and atraumatic.  Cardiovascular:     Rate and Rhythm: Normal rate and regular rhythm.     Heart sounds: Normal heart sounds.  Pulmonary:     Effort: Pulmonary effort is normal.     Breath sounds: Normal breath sounds.  Skin:    General: Skin is warm and dry.  Neurological:     Mental Status: She is alert and oriented to person, place, and time.  Psychiatric:        Behavior: Behavior normal.    BP 139/64    Pulse 72    Resp 16    Ht '5\' 3"'  (1.6 m)    Wt 118 lb (53.5 kg)    SpO2 98%    BMI 20.90 kg/m  Wt Readings from Last 3 Encounters:  10/09/21 118 lb (53.5 kg)  09/16/21 112 lb (50.8 kg)  09/02/21 130 lb (59 kg)     There are no preventive care reminders to display for this patient.  There are no preventive care reminders to display for this patient.  Lab Results  Component Value Date   TSH 2.19 06/11/2020   Lab Results  Component Value Date   WBC 8.4 09/16/2021    HGB 13.9 09/16/2021   HCT 41.6 09/16/2021   MCV 93.3 09/16/2021   PLT  354 09/16/2021   Lab Results  Component Value Date   NA 139 09/16/2021   K 4.2 09/16/2021   CO2 24 09/16/2021   GLUCOSE 121 (H) 09/16/2021   BUN 29 (H) 09/16/2021   CREATININE 1.24 (H) 09/16/2021   BILITOT 0.4 09/16/2021   ALKPHOS 74 11/30/2019   AST 20 09/16/2021   ALT 16 09/16/2021   PROT 7.0 09/16/2021   ALBUMIN 4.2 11/30/2019   CALCIUM 10.4 09/16/2021   EGFR 42 (L) 09/16/2021   Lab Results  Component Value Date   CHOL 266 (H) 12/02/2020   Lab Results  Component Value Date   HDL 51 12/02/2020   Lab Results  Component Value Date   LDLCALC 177 (H) 12/02/2020   Lab Results  Component Value Date   TRIG 215 (H) 12/02/2020   Lab Results  Component Value Date   CHOLHDL 5.2 (H) 12/02/2020   Lab Results  Component Value Date   HGBA1C 5.6 06/09/2021      Assessment & Plan:   Problem List Items Addressed This Visit       Cardiovascular and Mediastinum   Essential hypertension - Primary    Pressure is acceptable today based on age.  We will just continue to monitor carefully off of the amlodipine.      Cerebrovascular accident (CVA) due to embolism of left middle cerebral artery (HCC)    Continue Plavix.  Refill sent to pharmacy.      Relevant Medications   clopidogrel (PLAVIX) 75 MG tablet     Other   Vitamin D deficiency    Plan to recheck level in 2 months.      Stress and adjustment reaction    He did not tolerate the 5 mg Lexapro she said she felt like she "was going to climb the walls".  So after 1 tabs she discontinued it.  But she is still interested in having something to help her with her anxiety.  We will try duloxetine instead.  New prescription sent for 20 mg daily.  Follow-up in 8 weeks for new start medication.      Relevant Medications   DULoxetine (CYMBALTA) 20 MG capsule   Other Visit Diagnoses     Nausea       Relevant Medications   omeprazole (PRILOSEC)  40 MG capsule   ondansetron (ZOFRAN-ODT) 4 MG disintegrating tablet   Other Relevant Orders   Ambulatory referral to Gastroenterology   Dizziness       Relevant Medications   meclizine (ANTIVERT) 25 MG tablet      She also had an episode of shakiness, elevated blood pressure and tachycardia.  Cardiology is working this up with a stress test and a heart monitor they have not heard back yet on the scheduling of the stress test but her have heard back on the heart monitor.  Encouraged her daughter to call them and check on the progress of the referral since it has been a week.  Meds ordered this encounter  Medications   clopidogrel (PLAVIX) 75 MG tablet    Sig: Take 1 tablet (75 mg total) by mouth daily.    Dispense:  90 tablet    Refill:  3   omeprazole (PRILOSEC) 40 MG capsule    Sig: Take 1 capsule (40 mg total) by mouth daily.    Dispense:  90 capsule    Refill:  3   ondansetron (ZOFRAN-ODT) 4 MG disintegrating tablet    Sig: Take 1 tablet (4 mg total)  by mouth every 8 (eight) hours as needed for nausea or vomiting.    Dispense:  30 tablet    Refill:  0   meclizine (ANTIVERT) 25 MG tablet    Sig: Take 1 tablet (25 mg total) by mouth 3 (three) times daily as needed for dizziness.    Dispense:  30 tablet    Refill:  1   DULoxetine (CYMBALTA) 20 MG capsule    Sig: Take 1 capsule (20 mg total) by mouth daily.    Dispense:  30 capsule    Refill:  0    Follow-up: Return in about 2 months (around 12/09/2021) for New start medication and check Vitamin D .    Beatrice Lecher, MD

## 2021-10-09 NOTE — Assessment & Plan Note (Signed)
Continue Plavix.  Refill sent to pharmacy. ?

## 2021-10-09 NOTE — Assessment & Plan Note (Signed)
Plan to recheck level in 2 months. ?

## 2021-10-09 NOTE — Patient Instructions (Signed)
Plan to recheck Vitamin D in 2 months.   ?

## 2021-10-09 NOTE — Assessment & Plan Note (Signed)
He did not tolerate the 5 mg Lexapro she said she felt like she "was going to climb the walls".  So after 1 tabs she discontinued it.  But she is still interested in having something to help her with her anxiety.  We will try duloxetine instead.  New prescription sent for 20 mg daily.  Follow-up in 8 weeks for new start medication. ?

## 2021-10-13 DIAGNOSIS — I69398 Other sequelae of cerebral infarction: Secondary | ICD-10-CM | POA: Diagnosis not present

## 2021-10-13 DIAGNOSIS — N390 Urinary tract infection, site not specified: Secondary | ICD-10-CM | POA: Diagnosis not present

## 2021-10-13 DIAGNOSIS — R531 Weakness: Secondary | ICD-10-CM | POA: Diagnosis not present

## 2021-10-13 DIAGNOSIS — R2681 Unsteadiness on feet: Secondary | ICD-10-CM | POA: Diagnosis not present

## 2021-10-13 DIAGNOSIS — Z7902 Long term (current) use of antithrombotics/antiplatelets: Secondary | ICD-10-CM | POA: Diagnosis not present

## 2021-10-13 DIAGNOSIS — I69319 Unspecified symptoms and signs involving cognitive functions following cerebral infarction: Secondary | ICD-10-CM | POA: Diagnosis not present

## 2021-10-13 DIAGNOSIS — I10 Essential (primary) hypertension: Secondary | ICD-10-CM | POA: Diagnosis not present

## 2021-10-13 DIAGNOSIS — E785 Hyperlipidemia, unspecified: Secondary | ICD-10-CM | POA: Diagnosis not present

## 2021-10-13 DIAGNOSIS — I69320 Aphasia following cerebral infarction: Secondary | ICD-10-CM | POA: Diagnosis not present

## 2021-10-15 DIAGNOSIS — I69398 Other sequelae of cerebral infarction: Secondary | ICD-10-CM | POA: Diagnosis not present

## 2021-10-15 DIAGNOSIS — E785 Hyperlipidemia, unspecified: Secondary | ICD-10-CM | POA: Diagnosis not present

## 2021-10-15 DIAGNOSIS — Z7902 Long term (current) use of antithrombotics/antiplatelets: Secondary | ICD-10-CM | POA: Diagnosis not present

## 2021-10-15 DIAGNOSIS — I10 Essential (primary) hypertension: Secondary | ICD-10-CM | POA: Diagnosis not present

## 2021-10-15 DIAGNOSIS — R2681 Unsteadiness on feet: Secondary | ICD-10-CM | POA: Diagnosis not present

## 2021-10-15 DIAGNOSIS — I69319 Unspecified symptoms and signs involving cognitive functions following cerebral infarction: Secondary | ICD-10-CM | POA: Diagnosis not present

## 2021-10-15 DIAGNOSIS — N390 Urinary tract infection, site not specified: Secondary | ICD-10-CM | POA: Diagnosis not present

## 2021-10-15 DIAGNOSIS — R531 Weakness: Secondary | ICD-10-CM | POA: Diagnosis not present

## 2021-10-15 DIAGNOSIS — I69320 Aphasia following cerebral infarction: Secondary | ICD-10-CM | POA: Diagnosis not present

## 2021-10-20 DIAGNOSIS — E785 Hyperlipidemia, unspecified: Secondary | ICD-10-CM | POA: Diagnosis not present

## 2021-10-20 DIAGNOSIS — I69398 Other sequelae of cerebral infarction: Secondary | ICD-10-CM | POA: Diagnosis not present

## 2021-10-20 DIAGNOSIS — I69320 Aphasia following cerebral infarction: Secondary | ICD-10-CM | POA: Diagnosis not present

## 2021-10-20 DIAGNOSIS — N390 Urinary tract infection, site not specified: Secondary | ICD-10-CM | POA: Diagnosis not present

## 2021-10-20 DIAGNOSIS — R531 Weakness: Secondary | ICD-10-CM | POA: Diagnosis not present

## 2021-10-20 DIAGNOSIS — R2681 Unsteadiness on feet: Secondary | ICD-10-CM | POA: Diagnosis not present

## 2021-10-20 DIAGNOSIS — I10 Essential (primary) hypertension: Secondary | ICD-10-CM | POA: Diagnosis not present

## 2021-10-20 DIAGNOSIS — Z7902 Long term (current) use of antithrombotics/antiplatelets: Secondary | ICD-10-CM | POA: Diagnosis not present

## 2021-10-20 DIAGNOSIS — I69319 Unspecified symptoms and signs involving cognitive functions following cerebral infarction: Secondary | ICD-10-CM | POA: Diagnosis not present

## 2021-10-23 DIAGNOSIS — I69319 Unspecified symptoms and signs involving cognitive functions following cerebral infarction: Secondary | ICD-10-CM | POA: Diagnosis not present

## 2021-10-23 DIAGNOSIS — N390 Urinary tract infection, site not specified: Secondary | ICD-10-CM | POA: Diagnosis not present

## 2021-10-23 DIAGNOSIS — Z7902 Long term (current) use of antithrombotics/antiplatelets: Secondary | ICD-10-CM | POA: Diagnosis not present

## 2021-10-23 DIAGNOSIS — E785 Hyperlipidemia, unspecified: Secondary | ICD-10-CM | POA: Diagnosis not present

## 2021-10-23 DIAGNOSIS — R2681 Unsteadiness on feet: Secondary | ICD-10-CM | POA: Diagnosis not present

## 2021-10-23 DIAGNOSIS — I69320 Aphasia following cerebral infarction: Secondary | ICD-10-CM | POA: Diagnosis not present

## 2021-10-23 DIAGNOSIS — R531 Weakness: Secondary | ICD-10-CM | POA: Diagnosis not present

## 2021-10-23 DIAGNOSIS — I10 Essential (primary) hypertension: Secondary | ICD-10-CM | POA: Diagnosis not present

## 2021-10-23 DIAGNOSIS — I69398 Other sequelae of cerebral infarction: Secondary | ICD-10-CM | POA: Diagnosis not present

## 2021-10-24 DIAGNOSIS — R1319 Other dysphagia: Secondary | ICD-10-CM | POA: Diagnosis not present

## 2021-10-28 DIAGNOSIS — I69398 Other sequelae of cerebral infarction: Secondary | ICD-10-CM | POA: Diagnosis not present

## 2021-10-28 DIAGNOSIS — Z7902 Long term (current) use of antithrombotics/antiplatelets: Secondary | ICD-10-CM | POA: Diagnosis not present

## 2021-10-28 DIAGNOSIS — I69320 Aphasia following cerebral infarction: Secondary | ICD-10-CM | POA: Diagnosis not present

## 2021-10-28 DIAGNOSIS — R531 Weakness: Secondary | ICD-10-CM | POA: Diagnosis not present

## 2021-10-28 DIAGNOSIS — N390 Urinary tract infection, site not specified: Secondary | ICD-10-CM | POA: Diagnosis not present

## 2021-10-28 DIAGNOSIS — I69319 Unspecified symptoms and signs involving cognitive functions following cerebral infarction: Secondary | ICD-10-CM | POA: Diagnosis not present

## 2021-10-28 DIAGNOSIS — R2681 Unsteadiness on feet: Secondary | ICD-10-CM | POA: Diagnosis not present

## 2021-10-28 DIAGNOSIS — E785 Hyperlipidemia, unspecified: Secondary | ICD-10-CM | POA: Diagnosis not present

## 2021-10-28 DIAGNOSIS — I10 Essential (primary) hypertension: Secondary | ICD-10-CM | POA: Diagnosis not present

## 2021-10-29 ENCOUNTER — Other Ambulatory Visit: Payer: Self-pay | Admitting: Family Medicine

## 2021-10-29 DIAGNOSIS — F4329 Adjustment disorder with other symptoms: Secondary | ICD-10-CM

## 2021-10-30 DIAGNOSIS — K224 Dyskinesia of esophagus: Secondary | ICD-10-CM | POA: Diagnosis not present

## 2021-10-30 DIAGNOSIS — I69391 Dysphagia following cerebral infarction: Secondary | ICD-10-CM | POA: Diagnosis not present

## 2021-10-30 DIAGNOSIS — R1319 Other dysphagia: Secondary | ICD-10-CM | POA: Diagnosis not present

## 2021-10-31 ENCOUNTER — Ambulatory Visit (INDEPENDENT_AMBULATORY_CARE_PROVIDER_SITE_OTHER): Payer: Medicare Other | Admitting: Sports Medicine

## 2021-10-31 DIAGNOSIS — I10 Essential (primary) hypertension: Secondary | ICD-10-CM

## 2021-10-31 DIAGNOSIS — I63412 Cerebral infarction due to embolism of left middle cerebral artery: Secondary | ICD-10-CM | POA: Diagnosis not present

## 2021-10-31 MED ORDER — METOPROLOL SUCCINATE ER 25 MG PO TB24: 25.0000 mg | ORAL_TABLET | Freq: Every day | ORAL | 3 refills | Status: DC

## 2021-10-31 MED ORDER — METOPROLOL SUCCINATE ER 50 MG PO TB24: 50.0000 mg | ORAL_TABLET | Freq: Every day | ORAL | 3 refills | Status: DC

## 2021-10-31 NOTE — Assessment & Plan Note (Signed)
Patient will continue Plavix, she is being released from her assisted living to live at home, I will add home health physical therapy for stroke rehab and balance training. ?They are also asking for some other social modifications such as Meals on Wheels. ?

## 2021-10-31 NOTE — Assessment & Plan Note (Addendum)
Pleasant 88 year old female, post CVA, blood pressures are elevated today, she tells me they have been elevated on multiple occasions in the 216K systolic range, on recheck it dropped to 135/80, I think we should leave her medicines alone, she will continue aggressive hydration and physical therapy. ?

## 2021-10-31 NOTE — Progress Notes (Signed)
? ? ?  Procedures performed today:   ? ?None. ? ?Independent interpretation of notes and tests performed by another provider:  ? ?None. ? ?Brief History, Exam, Impression, and Recommendations:   ? ?Essential hypertension ?Pleasant 88 year old female, post CVA, blood pressures are elevated today, she tells me they have been elevated on multiple occasions in the 680H systolic range, on recheck it dropped to 135/80, I think we should leave her medicines alone, she will continue aggressive hydration and physical therapy. ? ?Cerebrovascular accident (CVA) due to embolism of left middle cerebral artery ([place]) ?Patient will continue Plavix, she is being released from her assisted living to live at home, I will add home health physical therapy for stroke rehab and balance training. ?They are also asking for some other social modifications such as Meals on Wheels. ? ? ? ?___________________________________________ ?Gwen Her. Dianah Field, M.D., ABFM., CAQSM. ?Primary Care and Sports Medicine ?[facility] ? ?Adjunct Instructor of Family Medicine  ?[employer] ?

## 2021-11-02 DIAGNOSIS — Z20822 Contact with and (suspected) exposure to covid-19: Secondary | ICD-10-CM | POA: Diagnosis not present

## 2021-11-02 DIAGNOSIS — I700 Atherosclerosis of aorta: Secondary | ICD-10-CM | POA: Diagnosis not present

## 2021-11-02 DIAGNOSIS — I10 Essential (primary) hypertension: Secondary | ICD-10-CM | POA: Diagnosis not present

## 2021-11-02 DIAGNOSIS — Z886 Allergy status to analgesic agent status: Secondary | ICD-10-CM | POA: Diagnosis not present

## 2021-11-02 DIAGNOSIS — Z87891 Personal history of nicotine dependence: Secondary | ICD-10-CM | POA: Diagnosis not present

## 2021-11-02 DIAGNOSIS — Z888 Allergy status to other drugs, medicaments and biological substances status: Secondary | ICD-10-CM | POA: Diagnosis not present

## 2021-11-02 DIAGNOSIS — J45901 Unspecified asthma with (acute) exacerbation: Secondary | ICD-10-CM | POA: Diagnosis not present

## 2021-11-02 DIAGNOSIS — R059 Cough, unspecified: Secondary | ICD-10-CM | POA: Diagnosis not present

## 2021-11-02 DIAGNOSIS — Z79899 Other long term (current) drug therapy: Secondary | ICD-10-CM | POA: Diagnosis not present

## 2021-11-02 DIAGNOSIS — R0602 Shortness of breath: Secondary | ICD-10-CM | POA: Diagnosis not present

## 2021-11-02 DIAGNOSIS — Z880 Allergy status to penicillin: Secondary | ICD-10-CM | POA: Diagnosis not present

## 2021-11-02 DIAGNOSIS — J439 Emphysema, unspecified: Secondary | ICD-10-CM | POA: Diagnosis not present

## 2021-11-02 DIAGNOSIS — Z743 Need for continuous supervision: Secondary | ICD-10-CM | POA: Diagnosis not present

## 2021-11-02 DIAGNOSIS — I499 Cardiac arrhythmia, unspecified: Secondary | ICD-10-CM | POA: Diagnosis not present

## 2021-11-02 DIAGNOSIS — R6889 Other general symptoms and signs: Secondary | ICD-10-CM | POA: Diagnosis not present

## 2021-11-03 ENCOUNTER — Telehealth: Payer: Self-pay | Admitting: General Practice

## 2021-11-03 NOTE — Telephone Encounter (Signed)
Transition Care Management Unsuccessful Follow-up Telephone Call ? ?Date of discharge and from where:  11/02/21 from [facility] ? ?Attempts:  1st Attempt ? ?Reason for unsuccessful TCM follow-up call:  Left voice message ? ?  ?

## 2021-11-05 DIAGNOSIS — I10 Essential (primary) hypertension: Secondary | ICD-10-CM | POA: Diagnosis not present

## 2021-11-05 DIAGNOSIS — Z720 Tobacco use: Secondary | ICD-10-CM | POA: Diagnosis not present

## 2021-11-05 DIAGNOSIS — I498 Other specified cardiac arrhythmias: Secondary | ICD-10-CM | POA: Diagnosis not present

## 2021-11-05 DIAGNOSIS — E782 Mixed hyperlipidemia: Secondary | ICD-10-CM | POA: Diagnosis not present

## 2021-11-05 DIAGNOSIS — R072 Precordial pain: Secondary | ICD-10-CM | POA: Diagnosis not present

## 2021-11-05 NOTE — Telephone Encounter (Signed)
Transition Care Management Unsuccessful Follow-up Telephone Call ? ?Date of discharge and from where:  11/02/21 from [facility] ? ?Attempts:  2nd Attempt ? ?Reason for unsuccessful TCM follow-up call:  Unable to reach patient ? ?  ?

## 2021-11-07 DIAGNOSIS — E785 Hyperlipidemia, unspecified: Secondary | ICD-10-CM | POA: Diagnosis not present

## 2021-11-07 DIAGNOSIS — I69398 Other sequelae of cerebral infarction: Secondary | ICD-10-CM | POA: Diagnosis not present

## 2021-11-07 DIAGNOSIS — I69319 Unspecified symptoms and signs involving cognitive functions following cerebral infarction: Secondary | ICD-10-CM | POA: Diagnosis not present

## 2021-11-07 DIAGNOSIS — Z87440 Personal history of urinary (tract) infections: Secondary | ICD-10-CM | POA: Diagnosis not present

## 2021-11-07 DIAGNOSIS — R2681 Unsteadiness on feet: Secondary | ICD-10-CM | POA: Diagnosis not present

## 2021-11-07 DIAGNOSIS — I10 Essential (primary) hypertension: Secondary | ICD-10-CM | POA: Diagnosis not present

## 2021-11-07 DIAGNOSIS — R531 Weakness: Secondary | ICD-10-CM | POA: Diagnosis not present

## 2021-11-07 DIAGNOSIS — Z7902 Long term (current) use of antithrombotics/antiplatelets: Secondary | ICD-10-CM | POA: Diagnosis not present

## 2021-11-07 DIAGNOSIS — I69320 Aphasia following cerebral infarction: Secondary | ICD-10-CM | POA: Diagnosis not present

## 2021-11-10 ENCOUNTER — Encounter: Payer: Self-pay | Admitting: Family Medicine

## 2021-11-10 ENCOUNTER — Ambulatory Visit (INDEPENDENT_AMBULATORY_CARE_PROVIDER_SITE_OTHER): Payer: Medicare Other | Admitting: Family Medicine

## 2021-11-10 VITALS — BP 145/66 | HR 60 | Resp 18 | Ht 63.0 in | Wt 114.0 lb

## 2021-11-10 DIAGNOSIS — I471 Supraventricular tachycardia: Secondary | ICD-10-CM | POA: Diagnosis not present

## 2021-11-10 DIAGNOSIS — I4892 Unspecified atrial flutter: Secondary | ICD-10-CM | POA: Diagnosis not present

## 2021-11-10 DIAGNOSIS — I10 Essential (primary) hypertension: Secondary | ICD-10-CM

## 2021-11-10 DIAGNOSIS — J449 Chronic obstructive pulmonary disease, unspecified: Secondary | ICD-10-CM | POA: Diagnosis not present

## 2021-11-10 DIAGNOSIS — I493 Ventricular premature depolarization: Secondary | ICD-10-CM | POA: Diagnosis not present

## 2021-11-10 DIAGNOSIS — I498 Other specified cardiac arrhythmias: Secondary | ICD-10-CM | POA: Diagnosis not present

## 2021-11-10 DIAGNOSIS — J4541 Moderate persistent asthma with (acute) exacerbation: Secondary | ICD-10-CM

## 2021-11-10 DIAGNOSIS — I491 Atrial premature depolarization: Secondary | ICD-10-CM | POA: Diagnosis not present

## 2021-11-10 DIAGNOSIS — F4329 Adjustment disorder with other symptoms: Secondary | ICD-10-CM | POA: Diagnosis not present

## 2021-11-10 MED ORDER — DULOXETINE HCL 30 MG PO CPEP: 30.0000 mg | ORAL_CAPSULE | Freq: Every day | ORAL | 1 refills | Status: DC

## 2021-11-10 NOTE — Assessment & Plan Note (Addendum)
She is much better overall she still has a little bit of a dry cough at night.  She says she is currently using 3 different inhalers. Recommend use the Stiolto once a day and the albuterol PRN.  Oxygen level looks great today. ? ?Ok to continue the [employer] - 2 puff inhaled in the AM. This one has a long acting albuterol in it.   ?Only use the albuterol as needed for symptoms.    ?

## 2021-11-10 NOTE — Assessment & Plan Note (Signed)
We will go ahead and increase Cymbalta to 30 mg she is actually done really well on it and has not had any side effects similar to what she experienced on the Lexapro.  PHQ-9 score of 7 today and GAD-7 score of 10.  Her symptoms is somewhat difficult for anxiety but not difficult at all for depression. ?

## 2021-11-10 NOTE — Patient Instructions (Addendum)
Ok to continue the KeySpan - 2 puff inhaled in the AM. This one has a long acting albuterol in it.   ?Only use the albuterol as needed for symptoms.    ?

## 2021-11-10 NOTE — Assessment & Plan Note (Signed)
Pressure just a little bit elevated today normally it looks better but repeat was about the same so we will keep an eye on this. ?

## 2021-11-10 NOTE — Telephone Encounter (Signed)
Transition Care Management Unsuccessful Follow-up Telephone Call ? ?Date of discharge and from where:  11/10/21 from [facility] ? ?Attempts:  3rd Attempt ? ?Reason for unsuccessful TCM follow-up call:  Unable to reach patient Patient has a follow up scheduled.  ? ?  ?

## 2021-11-10 NOTE — Progress Notes (Signed)
? ?Established Patient Office Visit ? ?Subjective:  ?Patient ID: Erika Lynch, female    DOB: [birth year]  Age: 88 y.o. MRN: [id number] ? ?CC:  ?Chief Complaint  ?Patient presents with  ? Hospital  ?  Follow up for asthma flare up. Patient complains of coughing mainly at night.   ? Discuss Letter   ?  Patient states Erika Lynch needs a letter for [facility] stating Erika Lynch is able to return home on 11/17/21.   ? Discuss Meals on Wheels  ?  Patient requesting to have our office reach out to have patient moved up on the list.   ? Anxiety  ?  Discuss increasing dose on Cymbalta   ? ? ?HPI ?ASTI MACKLEY presents for hospital follow up for Asthma exacerbation.  Erika Lynch was initially placed on 2 L of oxygen.  Chest x-ray was normal and reassuring Erika Lynch was negative for flu and COVID.  Erika Lynch was given albuterol treatment and started to improve also given Solu-Medrol and magnesium IV.  Erika Lynch was discharged home and placed on prednisone taper.  Started [employer].  Follow up for asthma flare up. Patient complains of coughing mainly at night.  Does see a dry cough without significant mucus production Erika Lynch definitely feels like Erika Lynch is greater than 50% better.  Her daughter reports that they actually have 3 inhalers at home though I only have 2 on her medication list. ? ?Follow-up stress/adjustment reaction-we had previously tried Lexapro but it made her feel like Erika Lynch was "going to climb the walls".  So we decided to start duloxetine.  Erika Lynch says Erika Lynch is actually tolerated it really well without any side effects and feels like it is actually been really helpful for her anxiety.  In fact Erika Lynch says Erika Lynch would like to go up on her dose if possible. ? ?Erika Lynch also had a couple of updates.  Erika Lynch did have her swallowing test and everything looked good that just felt like her muscles in her neck were weak and so Erika Lynch is getting therapy twice a week to work on that.  Erika Lynch also saw cardiology and had a stress test and everything came back reassuring on that as well.   They are getting her set up for heart monitor.  Erika Lynch has an appointment this afternoon to get that placed for 30 days. ? ? ?Past Medical History:  ?Diagnosis Date  ? Cataract   ? Dyskinesia of esophagus   ? Hypertension   ? Kidney stone   ? Uterine cancer ([place])   ? Vertigo 07/16/2020  ? ? ?Past Surgical History:  ?Procedure Laterality Date  ? ABDOMINAL HYSTERECTOMY    ? Uterine cancer  ? BACK SURGERY    ? CATARACT EXTRACTION, BILATERAL    ? ? ?Family History  ?Problem Relation Age of Onset  ? Heart attack Father   ? Stroke Father   ? Heart attack Brother   ? Heart attack Brother   ? Colon cancer Sister   ? Cancer Sister   ?     lung  ? Breast cancer Other   ? ? ?Social History  ? ?Socioeconomic History  ? Marital status: Divorced  ?  Spouse name: Not on file  ? Number of children: 4  ? Years of education: 8th   ? Highest education level: 8th grade  ?Occupational History  ? Occupation: Retired.   ?  Employer: RETIRED  ?  Comment: [employer]  ?Tobacco Use  ? Smoking status: Former  ?  Types:  Cigarettes  ?  Start date: 08/10/2021  ? Smokeless tobacco: Never  ? Tobacco comments:  ?  smokes cig . less than a half pack a week. Patient has not smoked in a few weeks   ?Vaping Use  ? Vaping Use: Never used  ?Substance and Sexual Activity  ? Alcohol use: No  ? Drug use: No  ? Sexual activity: Not Currently  ?Other Topics Concern  ? Not on file  ?Social History Narrative  ? 3 living children: [name], Danny.  Daily caffeine use. Regular exercise everyday, walking and doing stretching. Erika Lynch looks to do puzzles, workbooks and adult coloring books.  ? ?Social Determinants of Health  ? ?Financial Resource Strain: Not on file  ?Food Insecurity: Not on file  ?Transportation Needs: Not on file  ?Physical Activity: Not on file  ?Stress: Not on file  ?Social Connections: Not on file  ?Intimate Partner Violence: Not on file  ? ? ?Outpatient Medications Prior to Visit  ?Medication Sig Dispense Refill  ? acetaminophen (TYLENOL) 325 MG  tablet Take by mouth.    ? albuterol (VENTOLIN HFA) 108 (90 Base) MCG/ACT inhaler Inhale 2 puffs into the lungs every 6 (six) hours as needed for wheezing or shortness of breath. 8 g 1  ? atorvastatin (LIPITOR) 20 MG tablet Take 1 tablet (20 mg total) by mouth daily. 90 tablet 3  ? calcium carbonate (TUMS EX) 750 MG chewable tablet Chew 1 tablet by mouth daily.    ? clopidogrel (PLAVIX) 75 MG tablet Take 1 tablet (75 mg total) by mouth daily. 90 tablet 3  ? fluticasone (FLONASE) 50 MCG/ACT nasal spray Place into both nostrils daily.    ? meclizine (ANTIVERT) 25 MG tablet Take 1 tablet (25 mg total) by mouth 3 (three) times daily as needed for dizziness. 30 tablet 1  ? metoprolol succinate (TOPROL-XL) 25 MG 24 hr tablet Take 1 tablet (25 mg total) by mouth daily. 30 tablet 3  ? omeprazole (PRILOSEC) 40 MG capsule Take 1 capsule (40 mg total) by mouth daily. 90 capsule 3  ? ondansetron (ZOFRAN-ODT) 4 MG disintegrating tablet Take 1 tablet (4 mg total) by mouth every 8 (eight) hours as needed for nausea or vomiting. 30 tablet 0  ? Tiotropium Bromide-Olodaterol (STIOLTO RESPIMAT) 2.5-2.5 MCG/ACT AERS Inhale 2 puffs into the lungs daily. 12 g 3  ? DULoxetine (CYMBALTA) 20 MG capsule Take 1 capsule (20 mg total) by mouth daily. 30 capsule 0  ? Cholecalciferol 25 MCG (1000 UT) tablet Take by mouth.    ? ?No facility-administered medications prior to visit.  ? ? ?Allergies  ?Allergen Reactions  ? Other Other (See Comments)  ?  Pollen-- sneezing  ? Sulfa Antibiotics Rash  ? Aspirin   ? Cisapride   ? Imipramine Hcl   ? Lexapro [Escitalopram] Other (See Comments)  ?  Insomnia, increased anxiety  ? Mometasone Swelling  ?  Throat swelling ?  ? Penicillins Rash  ? Sulfonamide Derivatives Rash  ? ? ?ROS ?Review of Systems ? ?  ?Objective:  ?  ?Physical Exam ?Constitutional:   ?   Appearance: Normal appearance. Erika Lynch is well-developed.  ?HENT:  ?   Head: Normocephalic and atraumatic.  ?Eyes:  ?   Conjunctiva/sclera: Conjunctivae  normal.  ?Cardiovascular:  ?   Rate and Rhythm: Normal rate and regular rhythm.  ?   Heart sounds: Normal heart sounds.  ?Pulmonary:  ?   Effort: Pulmonary effort is normal.  ?   Breath sounds: Normal  breath sounds.  ?Skin: ?   General: Skin is warm and dry.  ?Neurological:  ?   Mental Status: Erika Lynch is alert and oriented to person, place, and time.  ?Psychiatric:     ?   Behavior: Behavior normal.  ? ? ?BP (!) 145/66 (BP Location: Left Arm)   Pulse 60   Resp 18   Ht '5\' 3"'  (1.6 m)   Wt 114 lb (51.7 kg)   SpO2 98%   BMI 20.19 kg/m?  ?Wt Readings from Last 3 Encounters:  ?11/10/21 114 lb (51.7 kg)  ?10/31/21 115 lb (52.2 kg)  ?10/09/21 118 lb (53.5 kg)  ? ? ? ?There are no preventive care reminders to display for this patient. ? ?There are no preventive care reminders to display for this patient. ? ?Lab Results  ?Component Value Date  ? TSH 2.19 06/11/2020  ? ?Lab Results  ?Component Value Date  ? WBC 8.4 09/16/2021  ? HGB 13.9 09/16/2021  ? HCT 41.6 09/16/2021  ? MCV 93.3 09/16/2021  ? PLT 354 09/16/2021  ? ?Lab Results  ?Component Value Date  ? NA 139 09/16/2021  ? K 4.2 09/16/2021  ? CO2 24 09/16/2021  ? GLUCOSE 121 (H) 09/16/2021  ? BUN 29 (H) 09/16/2021  ? CREATININE 1.24 (H) 09/16/2021  ? BILITOT 0.4 09/16/2021  ? ALKPHOS 74 11/30/2019  ? AST 20 09/16/2021  ? ALT 16 09/16/2021  ? PROT 7.0 09/16/2021  ? ALBUMIN 4.2 11/30/2019  ? CALCIUM 10.4 09/16/2021  ? EGFR 42 (L) 09/16/2021  ? ?Lab Results  ?Component Value Date  ? CHOL 266 (H) 12/02/2020  ? ?Lab Results  ?Component Value Date  ? HDL 51 12/02/2020  ? ?Lab Results  ?Component Value Date  ? LDLCALC 177 (H) 12/02/2020  ? ?Lab Results  ?Component Value Date  ? TRIG 215 (H) 12/02/2020  ? ?Lab Results  ?Component Value Date  ? CHOLHDL 5.2 (H) 12/02/2020  ? ?Lab Results  ?Component Value Date  ? HGBA1C 5.6 06/09/2021  ? ? ?  ?Assessment & Plan:  ? ?Problem List Items Addressed This Visit   ? ?  ? Cardiovascular and Mediastinum  ? Essential hypertension  ?   Pressure just a little bit elevated today normally it looks better but repeat was about the same so we will keep an eye on this. ?  ?  ?  ? Respiratory  ? COPD with asthma ([place])  ?  Erika Lynch is much better overall Erika Lynch sti

## 2021-11-11 DIAGNOSIS — I69320 Aphasia following cerebral infarction: Secondary | ICD-10-CM | POA: Diagnosis not present

## 2021-11-11 DIAGNOSIS — I10 Essential (primary) hypertension: Secondary | ICD-10-CM | POA: Diagnosis not present

## 2021-11-11 DIAGNOSIS — I69398 Other sequelae of cerebral infarction: Secondary | ICD-10-CM | POA: Diagnosis not present

## 2021-11-11 DIAGNOSIS — Z7902 Long term (current) use of antithrombotics/antiplatelets: Secondary | ICD-10-CM | POA: Diagnosis not present

## 2021-11-11 DIAGNOSIS — Z87440 Personal history of urinary (tract) infections: Secondary | ICD-10-CM | POA: Diagnosis not present

## 2021-11-11 DIAGNOSIS — I69319 Unspecified symptoms and signs involving cognitive functions following cerebral infarction: Secondary | ICD-10-CM | POA: Diagnosis not present

## 2021-11-11 DIAGNOSIS — R531 Weakness: Secondary | ICD-10-CM | POA: Diagnosis not present

## 2021-11-11 DIAGNOSIS — E785 Hyperlipidemia, unspecified: Secondary | ICD-10-CM | POA: Diagnosis not present

## 2021-11-11 DIAGNOSIS — R2681 Unsteadiness on feet: Secondary | ICD-10-CM | POA: Diagnosis not present

## 2021-11-13 DIAGNOSIS — Z87440 Personal history of urinary (tract) infections: Secondary | ICD-10-CM | POA: Diagnosis not present

## 2021-11-13 DIAGNOSIS — E785 Hyperlipidemia, unspecified: Secondary | ICD-10-CM | POA: Diagnosis not present

## 2021-11-13 DIAGNOSIS — I69320 Aphasia following cerebral infarction: Secondary | ICD-10-CM | POA: Diagnosis not present

## 2021-11-13 DIAGNOSIS — I10 Essential (primary) hypertension: Secondary | ICD-10-CM | POA: Diagnosis not present

## 2021-11-13 DIAGNOSIS — I69398 Other sequelae of cerebral infarction: Secondary | ICD-10-CM | POA: Diagnosis not present

## 2021-11-13 DIAGNOSIS — R2681 Unsteadiness on feet: Secondary | ICD-10-CM | POA: Diagnosis not present

## 2021-11-13 DIAGNOSIS — Z7902 Long term (current) use of antithrombotics/antiplatelets: Secondary | ICD-10-CM | POA: Diagnosis not present

## 2021-11-13 DIAGNOSIS — I69319 Unspecified symptoms and signs involving cognitive functions following cerebral infarction: Secondary | ICD-10-CM | POA: Diagnosis not present

## 2021-11-13 DIAGNOSIS — R531 Weakness: Secondary | ICD-10-CM | POA: Diagnosis not present

## 2021-11-18 DIAGNOSIS — R2681 Unsteadiness on feet: Secondary | ICD-10-CM | POA: Diagnosis not present

## 2021-11-18 DIAGNOSIS — Z87440 Personal history of urinary (tract) infections: Secondary | ICD-10-CM | POA: Diagnosis not present

## 2021-11-18 DIAGNOSIS — R531 Weakness: Secondary | ICD-10-CM | POA: Diagnosis not present

## 2021-11-18 DIAGNOSIS — I69319 Unspecified symptoms and signs involving cognitive functions following cerebral infarction: Secondary | ICD-10-CM | POA: Diagnosis not present

## 2021-11-18 DIAGNOSIS — Z7902 Long term (current) use of antithrombotics/antiplatelets: Secondary | ICD-10-CM | POA: Diagnosis not present

## 2021-11-18 DIAGNOSIS — I10 Essential (primary) hypertension: Secondary | ICD-10-CM | POA: Diagnosis not present

## 2021-11-18 DIAGNOSIS — I69320 Aphasia following cerebral infarction: Secondary | ICD-10-CM | POA: Diagnosis not present

## 2021-11-18 DIAGNOSIS — E785 Hyperlipidemia, unspecified: Secondary | ICD-10-CM | POA: Diagnosis not present

## 2021-11-18 DIAGNOSIS — I69398 Other sequelae of cerebral infarction: Secondary | ICD-10-CM | POA: Diagnosis not present

## 2021-11-20 DIAGNOSIS — I69398 Other sequelae of cerebral infarction: Secondary | ICD-10-CM | POA: Diagnosis not present

## 2021-11-20 DIAGNOSIS — Z7902 Long term (current) use of antithrombotics/antiplatelets: Secondary | ICD-10-CM | POA: Diagnosis not present

## 2021-11-20 DIAGNOSIS — R2681 Unsteadiness on feet: Secondary | ICD-10-CM | POA: Diagnosis not present

## 2021-11-20 DIAGNOSIS — I69320 Aphasia following cerebral infarction: Secondary | ICD-10-CM | POA: Diagnosis not present

## 2021-11-20 DIAGNOSIS — Z87440 Personal history of urinary (tract) infections: Secondary | ICD-10-CM | POA: Diagnosis not present

## 2021-11-20 DIAGNOSIS — R531 Weakness: Secondary | ICD-10-CM | POA: Diagnosis not present

## 2021-11-20 DIAGNOSIS — I10 Essential (primary) hypertension: Secondary | ICD-10-CM | POA: Diagnosis not present

## 2021-11-20 DIAGNOSIS — E785 Hyperlipidemia, unspecified: Secondary | ICD-10-CM | POA: Diagnosis not present

## 2021-11-20 DIAGNOSIS — I69319 Unspecified symptoms and signs involving cognitive functions following cerebral infarction: Secondary | ICD-10-CM | POA: Diagnosis not present

## 2021-11-24 DIAGNOSIS — Z87440 Personal history of urinary (tract) infections: Secondary | ICD-10-CM | POA: Diagnosis not present

## 2021-11-24 DIAGNOSIS — Z7902 Long term (current) use of antithrombotics/antiplatelets: Secondary | ICD-10-CM | POA: Diagnosis not present

## 2021-11-24 DIAGNOSIS — R531 Weakness: Secondary | ICD-10-CM | POA: Diagnosis not present

## 2021-11-24 DIAGNOSIS — I10 Essential (primary) hypertension: Secondary | ICD-10-CM | POA: Diagnosis not present

## 2021-11-24 DIAGNOSIS — I69398 Other sequelae of cerebral infarction: Secondary | ICD-10-CM | POA: Diagnosis not present

## 2021-11-24 DIAGNOSIS — I69320 Aphasia following cerebral infarction: Secondary | ICD-10-CM | POA: Diagnosis not present

## 2021-11-24 DIAGNOSIS — I69319 Unspecified symptoms and signs involving cognitive functions following cerebral infarction: Secondary | ICD-10-CM | POA: Diagnosis not present

## 2021-11-24 DIAGNOSIS — R2681 Unsteadiness on feet: Secondary | ICD-10-CM | POA: Diagnosis not present

## 2021-11-24 DIAGNOSIS — E785 Hyperlipidemia, unspecified: Secondary | ICD-10-CM | POA: Diagnosis not present

## 2021-11-27 DIAGNOSIS — E785 Hyperlipidemia, unspecified: Secondary | ICD-10-CM | POA: Diagnosis not present

## 2021-11-27 DIAGNOSIS — I69319 Unspecified symptoms and signs involving cognitive functions following cerebral infarction: Secondary | ICD-10-CM | POA: Diagnosis not present

## 2021-11-27 DIAGNOSIS — I69320 Aphasia following cerebral infarction: Secondary | ICD-10-CM | POA: Diagnosis not present

## 2021-11-27 DIAGNOSIS — I69398 Other sequelae of cerebral infarction: Secondary | ICD-10-CM | POA: Diagnosis not present

## 2021-11-27 DIAGNOSIS — R2681 Unsteadiness on feet: Secondary | ICD-10-CM | POA: Diagnosis not present

## 2021-11-27 DIAGNOSIS — I10 Essential (primary) hypertension: Secondary | ICD-10-CM | POA: Diagnosis not present

## 2021-11-27 DIAGNOSIS — R531 Weakness: Secondary | ICD-10-CM | POA: Diagnosis not present

## 2021-11-27 DIAGNOSIS — Z87440 Personal history of urinary (tract) infections: Secondary | ICD-10-CM | POA: Diagnosis not present

## 2021-11-27 DIAGNOSIS — Z7902 Long term (current) use of antithrombotics/antiplatelets: Secondary | ICD-10-CM | POA: Diagnosis not present

## 2021-12-01 DIAGNOSIS — R2681 Unsteadiness on feet: Secondary | ICD-10-CM | POA: Diagnosis not present

## 2021-12-01 DIAGNOSIS — E785 Hyperlipidemia, unspecified: Secondary | ICD-10-CM | POA: Diagnosis not present

## 2021-12-01 DIAGNOSIS — I69319 Unspecified symptoms and signs involving cognitive functions following cerebral infarction: Secondary | ICD-10-CM | POA: Diagnosis not present

## 2021-12-01 DIAGNOSIS — I69320 Aphasia following cerebral infarction: Secondary | ICD-10-CM | POA: Diagnosis not present

## 2021-12-01 DIAGNOSIS — Z7902 Long term (current) use of antithrombotics/antiplatelets: Secondary | ICD-10-CM | POA: Diagnosis not present

## 2021-12-01 DIAGNOSIS — I10 Essential (primary) hypertension: Secondary | ICD-10-CM | POA: Diagnosis not present

## 2021-12-01 DIAGNOSIS — I69398 Other sequelae of cerebral infarction: Secondary | ICD-10-CM | POA: Diagnosis not present

## 2021-12-01 DIAGNOSIS — R531 Weakness: Secondary | ICD-10-CM | POA: Diagnosis not present

## 2021-12-01 DIAGNOSIS — Z87440 Personal history of urinary (tract) infections: Secondary | ICD-10-CM | POA: Diagnosis not present

## 2021-12-03 DIAGNOSIS — I69398 Other sequelae of cerebral infarction: Secondary | ICD-10-CM | POA: Diagnosis not present

## 2021-12-03 DIAGNOSIS — Z87440 Personal history of urinary (tract) infections: Secondary | ICD-10-CM | POA: Diagnosis not present

## 2021-12-03 DIAGNOSIS — R2681 Unsteadiness on feet: Secondary | ICD-10-CM | POA: Diagnosis not present

## 2021-12-03 DIAGNOSIS — Z7902 Long term (current) use of antithrombotics/antiplatelets: Secondary | ICD-10-CM | POA: Diagnosis not present

## 2021-12-03 DIAGNOSIS — I69319 Unspecified symptoms and signs involving cognitive functions following cerebral infarction: Secondary | ICD-10-CM | POA: Diagnosis not present

## 2021-12-03 DIAGNOSIS — R531 Weakness: Secondary | ICD-10-CM | POA: Diagnosis not present

## 2021-12-03 DIAGNOSIS — I10 Essential (primary) hypertension: Secondary | ICD-10-CM | POA: Diagnosis not present

## 2021-12-03 DIAGNOSIS — I69320 Aphasia following cerebral infarction: Secondary | ICD-10-CM | POA: Diagnosis not present

## 2021-12-03 DIAGNOSIS — E785 Hyperlipidemia, unspecified: Secondary | ICD-10-CM | POA: Diagnosis not present

## 2021-12-08 ENCOUNTER — Ambulatory Visit (INDEPENDENT_AMBULATORY_CARE_PROVIDER_SITE_OTHER): Payer: Medicare Other | Admitting: Medical-Surgical

## 2021-12-08 ENCOUNTER — Encounter: Payer: Self-pay | Admitting: Medical-Surgical

## 2021-12-08 VITALS — Resp 20 | Ht 63.0 in | Wt 120.0 lb

## 2021-12-08 DIAGNOSIS — R42 Dizziness and giddiness: Secondary | ICD-10-CM | POA: Diagnosis not present

## 2021-12-08 MED ORDER — LORATADINE 10 MG PO TABS
10.0000 mg | ORAL_TABLET | Freq: Every day | ORAL | 0 refills | Status: AC
Start: 2021-12-08 — End: ?

## 2021-12-08 MED ORDER — PREDNISONE 20 MG PO TABS: 40.0000 mg | ORAL_TABLET | Freq: Every day | ORAL | 0 refills | Status: DC

## 2021-12-08 NOTE — Progress Notes (Signed)
?  HPI with pertinent ROS:  ? ?CC: nausea/dizziness ? ?HPI: ?Pleasant 88 year old female accompanied by her daughter presenting today for complaints of nausea with dizziness. Notes that last Thursday night, she went to bed feeling fine but woke up in the middle of the night with severe dizziness and nausea. This lasted for a couple of hours before resolving and she was able to get back to sleep. On Friday morning, she woke up with similar symptoms which again lasted a couple of hours. She reports nausea but no vomiting or diarrhea. She had some Meclizine at home from a previous episode of vertigo and reports that taking a dose of that helped. She again took the meclizine on Saturday and Sunday despite having no further symptoms. Today, she reports that she doesn't feel nauseated but has a feeling of fullness in her head from her ears around to the back of her head. Daughter notes allergies are a big problem and they try to keep Erika Lynch inside but wonders if some of her symptoms could be related to allergen exposure. Has also been wheezing despite using her inhalers as prescribed. No fevers, chills, chest pain, productive cough, or recent sick contact exposure.  ? ?I reviewed the past medical history, family history, social history, surgical history, and allergies today and no changes were needed.  Please see the problem list section below in epic for further details. ? ? ?Physical exam:  ? ?General: Well Developed, well nourished, and in no acute distress.  ?Neuro: Alert and oriented x3.  ?HEENT: Normocephalic, atraumatic, pupils equal round reactive to light, neck supple, no masses, no lymphadenopathy, thyroid nonpalpable. Bilateral ear canals patent, TMs with small serous effusion on the left. Difficult to visualize on the right.  ?Skin: Warm and dry. ?Cardiac: Regular rate and rhythm, no murmurs rubs or gallops, no lower extremity edema.  ?Respiratory: Expiratory wheezes noted to all lobes posteriorly. Not using  accessory muscles, speaking in full sentences. ? ?Impression and Recommendations:   ? ?1. Dizziness ?Unclear etiology. Consider BPPV, eustachian tube dysfunction secondary to seasonal allergies, or early viral URI symptoms. Low suspicion for stroke given paroxysmal nature of symptoms. Suspect eustachian tube dysfunction and vertigo may play a role together at this point. Given her symptoms and her current wheezing, treating with Prednisone '40mg'$  daily x 5 days. Recommend continuing Flonase but add Claritin '10mg'$  daily to current regimen to better manage allergen exposure. Reviewed strict return precautions and advised to contact the office for further instructions if no improvement in symptoms with the burst of steroids. Ok to continue meclizine prn.  ? ?Return if symptoms worsen or fail to improve. ?___________________________________________ ?Clearnce Sorrel, DNP, APRN, FNP-BC ?Primary Care and Sports Medicine ?[place] ?

## 2021-12-10 ENCOUNTER — Telehealth: Payer: Self-pay | Admitting: *Deleted

## 2021-12-10 DIAGNOSIS — R531 Weakness: Secondary | ICD-10-CM | POA: Diagnosis not present

## 2021-12-10 DIAGNOSIS — I69398 Other sequelae of cerebral infarction: Secondary | ICD-10-CM | POA: Diagnosis not present

## 2021-12-10 DIAGNOSIS — I69320 Aphasia following cerebral infarction: Secondary | ICD-10-CM | POA: Diagnosis not present

## 2021-12-10 DIAGNOSIS — Z87440 Personal history of urinary (tract) infections: Secondary | ICD-10-CM | POA: Diagnosis not present

## 2021-12-10 DIAGNOSIS — E785 Hyperlipidemia, unspecified: Secondary | ICD-10-CM | POA: Diagnosis not present

## 2021-12-10 DIAGNOSIS — Z7902 Long term (current) use of antithrombotics/antiplatelets: Secondary | ICD-10-CM | POA: Diagnosis not present

## 2021-12-10 DIAGNOSIS — I10 Essential (primary) hypertension: Secondary | ICD-10-CM | POA: Diagnosis not present

## 2021-12-10 DIAGNOSIS — R2681 Unsteadiness on feet: Secondary | ICD-10-CM | POA: Diagnosis not present

## 2021-12-10 DIAGNOSIS — I69319 Unspecified symptoms and signs involving cognitive functions following cerebral infarction: Secondary | ICD-10-CM | POA: Diagnosis not present

## 2021-12-10 MED ORDER — PANTOPRAZOLE SODIUM 40 MG PO TBEC
40.0000 mg | DELAYED_RELEASE_TABLET | Freq: Every day | ORAL | 3 refills | Status: DC
Start: 2021-12-10 — End: 2022-02-10

## 2021-12-10 NOTE — Telephone Encounter (Signed)
Erika Lynch called to report a drug interaction that just came up on two of her medications: ? ?Plavix and Omeprazole she stated that she wanted to make Dr. Madilyn Fireman aware.  ?

## 2021-12-10 NOTE — Telephone Encounter (Signed)
OK, please d/c the omeprazole and will sned over new rx for pantoprazole ? ?Meds ordered this encounter  ?Medications  ? pantoprazole (PROTONIX) 40 MG tablet  ?  Sig: Take 1 tablet (40 mg total) by mouth daily.  ?  Dispense:  90 tablet  ?  Refill:  3  ? ? ?

## 2021-12-10 NOTE — Telephone Encounter (Signed)
Spoke w/Helen and informed her of recommendations. ?She said that she had already spoken to [place] at the pharmacy about this and he gave his recommendations regarding the drug interaction.  ? ?She stated that her mother gets very anxious about things and she is concerned about her changing this medications.  ?

## 2021-12-11 ENCOUNTER — Ambulatory Visit: Payer: Medicare Other | Admitting: Family Medicine

## 2021-12-15 DIAGNOSIS — R2681 Unsteadiness on feet: Secondary | ICD-10-CM | POA: Diagnosis not present

## 2021-12-15 DIAGNOSIS — I69319 Unspecified symptoms and signs involving cognitive functions following cerebral infarction: Secondary | ICD-10-CM | POA: Diagnosis not present

## 2021-12-15 DIAGNOSIS — R531 Weakness: Secondary | ICD-10-CM | POA: Diagnosis not present

## 2021-12-15 DIAGNOSIS — I69398 Other sequelae of cerebral infarction: Secondary | ICD-10-CM | POA: Diagnosis not present

## 2021-12-15 DIAGNOSIS — Z7902 Long term (current) use of antithrombotics/antiplatelets: Secondary | ICD-10-CM | POA: Diagnosis not present

## 2021-12-15 DIAGNOSIS — I69320 Aphasia following cerebral infarction: Secondary | ICD-10-CM | POA: Diagnosis not present

## 2021-12-15 DIAGNOSIS — E785 Hyperlipidemia, unspecified: Secondary | ICD-10-CM | POA: Diagnosis not present

## 2021-12-15 DIAGNOSIS — I10 Essential (primary) hypertension: Secondary | ICD-10-CM | POA: Diagnosis not present

## 2021-12-15 DIAGNOSIS — Z87440 Personal history of urinary (tract) infections: Secondary | ICD-10-CM | POA: Diagnosis not present

## 2021-12-18 DIAGNOSIS — I491 Atrial premature depolarization: Secondary | ICD-10-CM | POA: Diagnosis not present

## 2021-12-18 DIAGNOSIS — I493 Ventricular premature depolarization: Secondary | ICD-10-CM | POA: Diagnosis not present

## 2021-12-18 DIAGNOSIS — I471 Supraventricular tachycardia: Secondary | ICD-10-CM | POA: Diagnosis not present

## 2021-12-18 DIAGNOSIS — I4729 Other ventricular tachycardia: Secondary | ICD-10-CM | POA: Diagnosis not present

## 2021-12-18 DIAGNOSIS — I498 Other specified cardiac arrhythmias: Secondary | ICD-10-CM | POA: Diagnosis not present

## 2021-12-22 DIAGNOSIS — I69319 Unspecified symptoms and signs involving cognitive functions following cerebral infarction: Secondary | ICD-10-CM | POA: Diagnosis not present

## 2021-12-22 DIAGNOSIS — R531 Weakness: Secondary | ICD-10-CM | POA: Diagnosis not present

## 2021-12-22 DIAGNOSIS — I10 Essential (primary) hypertension: Secondary | ICD-10-CM | POA: Diagnosis not present

## 2021-12-22 DIAGNOSIS — Z87440 Personal history of urinary (tract) infections: Secondary | ICD-10-CM | POA: Diagnosis not present

## 2021-12-22 DIAGNOSIS — I69398 Other sequelae of cerebral infarction: Secondary | ICD-10-CM | POA: Diagnosis not present

## 2021-12-22 DIAGNOSIS — I69320 Aphasia following cerebral infarction: Secondary | ICD-10-CM | POA: Diagnosis not present

## 2021-12-22 DIAGNOSIS — E785 Hyperlipidemia, unspecified: Secondary | ICD-10-CM | POA: Diagnosis not present

## 2021-12-22 DIAGNOSIS — R2681 Unsteadiness on feet: Secondary | ICD-10-CM | POA: Diagnosis not present

## 2021-12-22 DIAGNOSIS — Z7902 Long term (current) use of antithrombotics/antiplatelets: Secondary | ICD-10-CM | POA: Diagnosis not present

## 2021-12-24 ENCOUNTER — Other Ambulatory Visit: Payer: Self-pay

## 2021-12-24 DIAGNOSIS — E782 Mixed hyperlipidemia: Secondary | ICD-10-CM

## 2021-12-24 MED ORDER — ATORVASTATIN CALCIUM 20 MG PO TABS: 20.0000 mg | ORAL_TABLET | Freq: Every day | ORAL | 3 refills | Status: DC

## 2021-12-24 NOTE — Telephone Encounter (Signed)
Patients daughter called stating patient needs a refill on Atorvastatin 20 mg. Rx sent.

## 2022-01-12 ENCOUNTER — Ambulatory Visit (INDEPENDENT_AMBULATORY_CARE_PROVIDER_SITE_OTHER): Payer: Medicare Other | Admitting: Family Medicine

## 2022-01-12 VITALS — BP 171/78 | HR 72 | Ht 63.0 in | Wt 124.0 lb

## 2022-01-12 DIAGNOSIS — Z0000 Encounter for general adult medical examination without abnormal findings: Secondary | ICD-10-CM | POA: Diagnosis not present

## 2022-01-12 DIAGNOSIS — Z780 Asymptomatic menopausal state: Secondary | ICD-10-CM | POA: Diagnosis not present

## 2022-01-12 NOTE — Progress Notes (Signed)
MEDICARE ANNUAL WELLNESS VISIT  01/12/2022  Subjective:  Erika Lynch is a 88 y.o. female patient of [name], Rene Kocher, MD who had a Medicare Annual Wellness Visit today. Syndey is Retired and lives alone. she has 3 Erika children and one deceased. she reports that she is socially active and does interact with friends/family regularly. she is minimally physically active and enjoys puzzles and workbooks.  Patient Care Team: Hali Marry, MD as PCP - General Darius Bump, [facility] as Pharmacist (Pharmacist)     01/12/2022    2:01 PM 08/19/2020    1:11 PM 08/14/2019    1:02 PM 08/10/2018   10:46 AM 11/23/2013    3:05 PM  Advanced Directives  Does Patient Have a Medical Advance Directive? Yes Yes Yes Yes Patient has advance directive, copy not in chart  Type of Advance Directive Healthcare Power of Attorney Erika Lynch Erika Lynch;Erika Lynch Erika Lynch;Erika Lynch Erika Lynch;Erika Lynch  Does patient want to make changes to medical advance directive? No - Patient declined No - Patient declined No - Patient declined No - Patient declined   Copy of [place] in Chart? Yes - validated most recent copy scanned in chart (See row information)  Yes - validated most recent copy scanned in chart (See row information) Yes - validated most recent copy scanned in chart (See row information) Copy requested from family    Hospital Utilization Over the Past 12 Months: # of hospitalizations or ER visits: 2 # of surgeries: 0  Review of Systems    Patient reports that her overall health is worse when compared to last year.  Review of Systems: History obtained from chart review and the patient  All other systems negative.  Pain Assessment Pain : No/denies pain     Current Medications & Allergies (verified) Allergies as of 01/12/2022       Reactions   Other Other (See Comments)   Pollen-- sneezing   Sulfa  Antibiotics Rash   Aspirin    Cisapride    Imipramine Hcl    Lexapro [escitalopram] Other (See Comments)   Insomnia, increased anxiety   Mometasone Swelling   Throat swelling   Penicillins Rash   Sulfonamide Derivatives Rash        Medication List        Accurate as of January 12, 2022  2:45 PM. If you have any questions, ask your nurse or doctor.          acetaminophen 325 MG tablet Commonly known as: TYLENOL Take by mouth.   albuterol 108 (90 Base) MCG/ACT inhaler Commonly known as: VENTOLIN HFA Inhale 2 puffs into the lungs every 6 (six) hours as needed for wheezing or shortness of breath.   atorvastatin 20 MG tablet Commonly known as: LIPITOR Take 1 tablet (20 mg total) by mouth daily.   calcium carbonate 750 MG chewable tablet Commonly known as: TUMS EX Chew 1 tablet by mouth daily.   clopidogrel 75 MG tablet Commonly known as: PLAVIX Take 1 tablet (75 mg total) by mouth daily.   DULoxetine 30 MG capsule Commonly known as: CYMBALTA Take 1 capsule (30 mg total) by mouth daily.   fluticasone 50 MCG/ACT nasal spray Commonly known as: FLONASE Place into both nostrils daily.   loratadine 10 MG tablet Commonly known as: CLARITIN Take 1 tablet (10 mg total) by mouth daily.   meclizine 25 MG tablet Commonly known as: ANTIVERT Take 1 tablet (  25 mg total) by mouth 3 (three) times daily as needed for dizziness.   metoprolol succinate 25 MG 24 hr tablet Commonly known as: TOPROL-XL Take 1 tablet (25 mg total) by mouth daily.   ondansetron 4 MG disintegrating tablet Commonly known as: ZOFRAN-ODT Take 1 tablet (4 mg total) by mouth every 8 (eight) hours as needed for nausea or vomiting.   pantoprazole 40 MG tablet Commonly known as: PROTONIX Take 1 tablet (40 mg total) by mouth daily.   predniSONE 20 MG tablet Commonly known as: DELTASONE Take 2 tablets (40 mg total) by mouth daily with breakfast.   Stiolto Respimat 2.5-2.5 MCG/ACT Aers Generic drug:  Tiotropium Bromide-Olodaterol Inhale 2 puffs into the lungs daily.        History (reviewed): Past Medical History:  Diagnosis Date   Cataract    Dyskinesia of esophagus    Hypertension    Kidney stone    Stroke ([place]) 08/09/2021   Uterine cancer (HCC)    Vertigo 07/16/2020   Past Surgical History:  Procedure Laterality Date   ABDOMINAL HYSTERECTOMY     Uterine cancer   BACK SURGERY     CATARACT EXTRACTION, BILATERAL     Family History  Problem Relation Age of Onset   Heart attack Father    Stroke Father    Heart attack Brother    Heart attack Brother    Colon cancer Sister    Cancer Sister        lung   Breast cancer Other    Social History   Socioeconomic History   Marital status: Divorced    Spouse name: Not on file   Number of children: 4   Years of education: 8th    Highest education level: 8th grade  Occupational History   Occupation: Retired.     Employer: RETIRED    Comment: [employer]  Tobacco Use   Smoking status: Former    Types: Cigarettes    Start date: 08/10/2021   Smokeless tobacco: Never   Tobacco comments:    smokes cig . less than a half pack a week. Patient has not smoked in a few weeks   Vaping Use   Vaping Use: Never used  Substance and Sexual Activity   Alcohol use: No   Drug use: No   Sexual activity: Not Currently  Other Topics Concern   Not on file  Social History Narrative   3 Erika children: Erika Lynch, Erika Lynch.  Daily caffeine use. Regular exercise everyday, walking and doing stretching. She looks to sewing, puzzles, workbooks and adult coloring books.   Social Determinants of Health   Financial Resource Strain: Low Risk  (01/12/2022)   Overall Financial Resource Strain (CARDIA)    Difficulty of Paying Erika Expenses: Not hard at all  Food Insecurity: No Food Insecurity (01/12/2022)   Hunger Vital Sign    Worried About Running Out of Food in the Last Year: Never true    Ran Out of Food in the Last Year: Never true   Transportation Needs: No Transportation Needs (01/12/2022)   PRAPARE - Hydrologist (Medical): No    Lack of Transportation (Non-Medical): No  Physical Activity: Sufficiently Active (01/12/2022)   Exercise Vital Sign    Days of Exercise per Week: 7 days    Minutes of Exercise per Session: 30 min  Stress: No Stress Concern Present (01/12/2022)   [place]    Feeling of  Stress : Not at all  Social Connections: Socially Isolated (01/12/2022)   Social Connection and Isolation Panel [NHANES]    Frequency of Communication with Friends and Family: More than three times a week    Frequency of Social Gatherings with Friends and Family: Once a week    Attends Religious Services: Never    Marine scientist or Organizations: No    Attends Archivist Meetings: Never    Marital Status: Divorced    Activities of Daily Erika    01/12/2022    2:09 PM  In your present state of health, do you have any difficulty performing the following activities:  Hearing? 1  Comment Left ear hearing loss is worse than the right ear  Vision? 1  Comment she stated her vision is blurry even with the glasses  Difficulty concentrating or making decisions? 0  Walking or climbing stairs? 1  Comment she does not climb stairs  Dressing or bathing? 0  Doing errands, shopping? 1  Comment her daughter usually takes her.  Preparing Food and eating ? N  Using the Toilet? N  In the past six months, have you accidently leaked urine? N  Do you have problems with loss of bowel control? N  Managing your Medications? Y  Comment her daughter makes her pill box  Managing your Finances? Y  Comment her daughter helps with the finances  Housekeeping or managing your Housekeeping? Y  Comment her daughter helps with this.    Patient Education/Literacy How often do you need to have someone help you when you read  instructions, pamphlets, or other written materials from your doctor or pharmacy?: 1 - Never What is the last grade level you completed in school?: 8th grade  Exercise Current Exercise Habits: Home exercise routine, Type of exercise: stretching, Time (Minutes): 30, Frequency (Times/Week): 7, Weekly Exercise (Minutes/Week): 210, Intensity: Moderate, Exercise limited by: None identified  Diet Patient reports consuming 3 meals a day and 1 snack(s) a day Patient reports that her primary diet is: Regular Patient reports that she does have regular access to food.   Depression Screen    01/12/2022    2:01 PM 12/08/2021    4:21 PM 11/10/2021    1:40 PM 11/10/2021   10:52 AM 10/09/2021    2:24 PM 09/02/2021    1:55 PM 08/19/2020    1:36 PM  PHQ 2/9 Scores  PHQ - 2 Score 0 0 1 0 0  0  PHQ- 9 Score   7    0  Exception Documentation      Other- indicate reason in comment box      Fall Risk    01/12/2022    2:01 PM 12/08/2021    4:20 PM 11/10/2021   10:52 AM 09/16/2021    2:44 PM 09/02/2021    1:55 PM  Fall Risk   Falls in the past year? 0 0 0 0 0  Number falls in past yr: 0 0 0 0 0  Injury with Fall? 0 0 0 0 0  Risk for fall due to : No Fall Risks No Fall Risks No Fall Risks Impaired balance/gait;Impaired mobility No Fall Risks  Follow up Falls evaluation completed Falls evaluation completed Falls prevention discussed;Falls evaluation completed Falls evaluation completed;Falls prevention discussed Falls evaluation completed     Objective:   BP (!) 171/78 (BP Location: Left Arm, Patient Position: Sitting, Cuff Size: Normal)   Pulse 72   Ht '5\' 3"'$  (1.6 m)  Wt 124 lb (56.2 kg)   SpO2 100%   BMI 21.97 kg/m   Last Weight  Most recent update: 01/12/2022  1:57 PM    Weight  56.2 kg (124 lb)             Body mass index is 21.97 kg/m.  Hearing/Vision  Natalyia did not have difficulty with hearing/understanding during the face-to-face interview Darlis did not have difficulty with her vision  during the face-to-face interview Reports that she has not had a formal eye exam by an eye care professional within the past year Reports that she has not had a formal hearing evaluation within the past year  Cognitive Function:    01/12/2022    2:12 PM 08/19/2020    1:14 PM 08/14/2019    1:07 PM 08/10/2018   10:50 AM 02/11/2017    3:23 PM  6CIT Screen  What Year? 4 points 0 points 0 points 0 points 0 points  What month? 0 points 0 points 0 points 0 points 0 points  What time? 0 points 0 points 0 points 0 points 0 points  Count back from 20 2 points 0 points 2 points 2 points 0 points  Months in reverse 4 points 0 points 2 points 0 points 2 points  Repeat phrase 2 points 2 points 0 points 0 points 6 points  Total Score 12 points 2 points 4 points 2 points 8 points    Normal Cognitive Function Screening: No: She has had a stroke earlier in the year. (Normal:0-7, Significant for Dysfunction: >8)  Immunization & Health Maintenance Record Immunization History  Administered Date(s) Administered   Fluad Quad(high Dose 65+) 08/14/2019, 06/11/2020   Influenza Split 05/04/2012   Influenza Whole 05/17/2008, 05/27/2009, 04/17/2010   Influenza, High Dose Seasonal PF 04/15/2016, 03/25/2017, 08/08/2018   Influenza-Unspecified 04/03/2013, 04/20/2015   Pneumococcal Conjugate-13 05/13/2015   Pneumococcal Polysaccharide-23 12/04/2008   Td 12/04/2008   Zoster, Live 05/07/2010    Health Maintenance  Topic Date Due   Zoster Vaccines- Shingrix (1 of 2) 09/09/2022 (Originally 01/27/1952)   TETANUS/TDAP  06/10/2023 (Originally 12/05/2018)   COVID-19 Vaccine (1) 06/26/2023 (Originally 07/30/1933)   INFLUENZA VACCINE  03/03/2022   Pneumonia Vaccine 37+ Years old  Completed   DEXA SCAN  Completed   HPV VACCINES  Aged Out       Assessment  This is a routine wellness examination for [facility].  Health Maintenance: Due or Overdue There are no preventive care reminders to display for this  patient.  Darryl Nestle does not need a referral for Community Assistance: Care Management:   no Social Work:    no Prescription Assistance:  no Nutrition/Diabetes Education:  no   Plan:  Personalized Goals  Goals Addressed               This Visit's Progress     Patient Stated (pt-stated)        Patient would like to continue to stay healthy.       Personalized Health Maintenance & Screening Recommendations  Shingrix  Tetanus/TDAP Bone Density screening  Lung Cancer Screening Recommended: no (Low Dose CT Chest recommended if Age 58-80 years, 30 pack-year currently smoking OR have quit w/in past 15 years) Hepatitis C Screening recommended: no HIV Screening recommended: no  Advanced Directives: Written information was not given per the patient's request.  Referrals & Orders Orders Placed This Encounter  Procedures   [place]    Follow-up Plan Follow-up with Hali Marry,  MD as planned Schedule your shingrix and tetanus at your pharmacy.  Bone density scan referral has been sent and they Lynch call you to schedule. Medicare wellness visit in one year.  AVS printed and given to the patient.   I have personally reviewed and noted the following in the patient's chart:   Medical and social history Use of alcohol, tobacco or illicit drugs  Current medications and supplements Functional ability and status Nutritional status Physical activity Advanced directives List of other physicians Hospitalizations, surgeries, and ER visits in previous 12 months Vitals Screenings to include cognitive, depression, and falls Referrals and appointments  In addition, I have reviewed and discussed with patient certain preventive protocols, quality metrics, and best practice recommendations. A written personalized care plan for preventive services as well as general preventive health recommendations were provided to patient.     Tinnie Gens,  RN-BSN  01/12/2022

## 2022-01-12 NOTE — Patient Instructions (Addendum)
[place] Maintenance Summary and Written Plan of Care  Erika Lynch ,  Thank you for allowing me to perform your Medicare Annual Wellness Visit and for your ongoing commitment to your health.   Health Maintenance & Immunization History Health Maintenance  Topic Date Due   Zoster Vaccines- Shingrix (1 of 2) 09/09/2022 (Originally 01/27/1952)   TETANUS/TDAP  06/10/2023 (Originally 12/05/2018)   COVID-19 Vaccine (1) 06/26/2023 (Originally 07/30/1933)   INFLUENZA VACCINE  03/03/2022   Pneumonia Vaccine 47+ Years old  Completed   DEXA SCAN  Completed   HPV VACCINES  Aged Out   Immunization History  Administered Date(s) Administered   Fluad Quad(high Dose 65+) 08/14/2019, 06/11/2020   Influenza Split 05/04/2012   Influenza Whole 05/17/2008, 05/27/2009, 04/17/2010   Influenza, High Dose Seasonal PF 04/15/2016, 03/25/2017, 08/08/2018   Influenza-Unspecified 04/03/2013, 04/20/2015   Pneumococcal Conjugate-13 05/13/2015   Pneumococcal Polysaccharide-23 12/04/2008   Td 12/04/2008   Zoster, Live 05/07/2010    These are the patient goals that we discussed:  Goals Addressed               This Visit's Progress     Patient Stated (pt-stated)        Patient would like to continue to stay healthy.         This is a list of Health Maintenance Items that are overdue or due now: Shingrix  Tetanus/TDAP Bone Density screening  Orders/Referrals Placed Today: Orders Placed This Encounter  Procedures   DEXAScan    Standing Status:   Future    Standing Expiration Date:   01/13/2023    Scheduling Instructions:     Please call her daughter, [name], to schedule the appointment. Her number is [phone].    Order Specific Question:   Reason for exam:    Answer:   Post Menopausal    Order Specific Question:   Preferred imaging location?    Answer:   [facility]   (Contact our referral department at [phone] if you have not spoken with  someone about your referral appointment within the next 5 days)    Follow-up Plan Follow-up with Hali Marry, MD as planned Schedule your shingrix and tetanus at your pharmacy.  Bone density scan referral has been sent and they will call you to schedule. Medicare wellness visit in one year.  AVS printed and given to the patient.      Health Maintenance, Female Adopting a healthy lifestyle and getting preventive care are important in promoting health and wellness. Ask your health care provider about: The right schedule for you to have regular tests and exams. Things you can do on your own to prevent diseases and keep yourself healthy. What should I know about diet, weight, and exercise? Eat a healthy diet  Eat a diet that includes plenty of vegetables, fruits, low-fat dairy products, and lean protein. Do not eat a lot of foods that are high in solid fats, added sugars, or sodium. Maintain a healthy weight Body mass index (BMI) is used to identify weight problems. It estimates body fat based on height and weight. Your health care provider can help determine your BMI and help you achieve or maintain a healthy weight. Get regular exercise Get regular exercise. This is one of the most important things you can do for your health. Most adults should: Exercise for at least 150 minutes each week. The exercise should increase your heart rate and make you sweat (moderate-intensity exercise). Do strengthening exercises  at least twice a week. This is in addition to the moderate-intensity exercise. Spend less time sitting. Even light physical activity can be beneficial. Watch cholesterol and blood lipids Have your blood tested for lipids and cholesterol at 86 years of age, then have this test every 5 years. Have your cholesterol levels checked more often if: Your lipid or cholesterol levels are high. You are older than 86 years of age. You are at high risk for heart disease. What  should I know about cancer screening? Depending on your health history and family history, you may need to have cancer screening at various ages. This may include screening for: Breast cancer. Cervical cancer. Colorectal cancer. Skin cancer. Lung cancer. What should I know about heart disease, diabetes, and high blood pressure? Blood pressure and heart disease High blood pressure causes heart disease and increases the risk of stroke. This is more likely to develop in people who have high blood pressure readings or are overweight. Have your blood pressure checked: Every 3-5 years if you are 31-38 years of age. Every year if you are 42 years old or older. Diabetes Have regular diabetes screenings. This checks your fasting blood sugar level. Have the screening done: Once every three years after age 59 if you are at a normal weight and have a low risk for diabetes. More often and at a younger age if you are overweight or have a high risk for diabetes. What should I know about preventing infection? Hepatitis B If you have a higher risk for hepatitis B, you should be screened for this virus. Talk with your health care provider to find out if you are at risk for hepatitis B infection. Hepatitis C Testing is recommended for: Everyone born from [birth year] through 1963. Anyone with known risk factors for hepatitis C. Sexually transmitted infections (STIs) Get screened for STIs, including gonorrhea and chlamydia, if: You are sexually active and are younger than 86 years of age. You are older than 85 years of age and your health care provider tells you that you are at risk for this type of infection. Your sexual activity has changed since you were last screened, and you are at increased risk for chlamydia or gonorrhea. Ask your health care provider if you are at risk. Ask your health care provider about whether you are at high risk for HIV. Your health care provider may recommend a prescription medicine  to help prevent HIV infection. If you choose to take medicine to prevent HIV, you should first get tested for HIV. You should then be tested every 3 months for as long as you are taking the medicine. Pregnancy If you are about to stop having your period (premenopausal) and you may become pregnant, seek counseling before you get pregnant. Take 400 to 800 micrograms (mcg) of folic acid every day if you become pregnant. Ask for birth control (contraception) if you want to prevent pregnancy. Osteoporosis and menopause Osteoporosis is a disease in which the bones lose minerals and strength with aging. This can result in bone fractures. If you are 48 years old or older, or if you are at risk for osteoporosis and fractures, ask your health care provider if you should: Be screened for bone loss. Take a calcium or vitamin D supplement to lower your risk of fractures. Be given hormone replacement therapy (HRT) to treat symptoms of menopause. Follow these instructions at home: Alcohol use Do not drink alcohol if: Your health care provider tells you not to drink. You  are pregnant, may be pregnant, or are planning to become pregnant. If you drink alcohol: Limit how much you have to: 0-1 drink a day. Know how much alcohol is in your drink. In the [place] one drink equals one 12 oz bottle of beer (355 mL), one 5 oz glass of wine (148 mL), or one 1 oz glass of hard liquor (44 mL). Lifestyle Do not use any products that contain nicotine or tobacco. These products include cigarettes, chewing tobacco, and vaping devices, such as e-cigarettes. If you need help quitting, ask your health care provider. Do not use street drugs. Do not share needles. Ask your health care provider for help if you need support or information about quitting drugs. General instructions Schedule regular health, dental, and eye exams. Stay current with your vaccines. Tell your health care provider if: You often feel depressed. You  have ever been abused or do not feel safe at home. Summary Adopting a healthy lifestyle and getting preventive care are important in promoting health and wellness. Follow your health care provider's instructions about healthy diet, exercising, and getting tested or screened for diseases. Follow your health care provider's instructions on monitoring your cholesterol and blood pressure. This information is not intended to replace advice given to you by your health care provider. Make sure you discuss any questions you have with your health care provider. Document Revised: 12/09/2020 Document Reviewed: 12/09/2020 Elsevier Patient Education  [place].

## 2022-01-22 ENCOUNTER — Ambulatory Visit (INDEPENDENT_AMBULATORY_CARE_PROVIDER_SITE_OTHER): Payer: Medicare Other

## 2022-01-22 ENCOUNTER — Ambulatory Visit (INDEPENDENT_AMBULATORY_CARE_PROVIDER_SITE_OTHER): Payer: Medicare Other | Admitting: Family Medicine

## 2022-01-22 ENCOUNTER — Encounter: Payer: Self-pay | Admitting: Family Medicine

## 2022-01-22 VITALS — BP 164/65 | HR 72 | Ht 63.0 in | Wt 125.0 lb

## 2022-01-22 DIAGNOSIS — R051 Acute cough: Secondary | ICD-10-CM

## 2022-01-22 DIAGNOSIS — R110 Nausea: Secondary | ICD-10-CM

## 2022-01-22 DIAGNOSIS — R42 Dizziness and giddiness: Secondary | ICD-10-CM | POA: Diagnosis not present

## 2022-01-22 DIAGNOSIS — R519 Headache, unspecified: Secondary | ICD-10-CM

## 2022-01-22 DIAGNOSIS — I10 Essential (primary) hypertension: Secondary | ICD-10-CM

## 2022-01-22 DIAGNOSIS — R059 Cough, unspecified: Secondary | ICD-10-CM | POA: Diagnosis not present

## 2022-01-22 NOTE — Progress Notes (Unsigned)
Acute Office Visit  Subjective:     Patient ID: Erika Lynch, female    DOB: Dec 28, 1932, 88 y.o.   MRN: [id number]  Chief Complaint  Patient presents with   Headache   Dizziness    HPI Patient is in today for Headaches and dizziness.  She has had intermittent vertigo but has started back again recently.  She also c/ of daily HA that started 6/12.  She wakes up with it and says it does improve as the day goes on but it never goes completely away.  He says it is bilateral over both sides of her head.  No worsening or aggravating factors.  No alleviating factors.  Tylenol does not help.  She has been try to check her blood pressure at home and says its been running in the 140s to 150s at home but the diastolic has been fine.  Reports that she is taking her medications and she has not had any recent changes.  Her daughter says that they actually hold her statin for a few days just to make sure it was not causing her symptoms but it did not change anything so they have restarted it.  They also held the Claritin for several days because she had recently started that medication for some allergy symptoms but says they did not notice a difference after holding that for a week.  She also feels very nauseated.  He also reports that she has been coughing for the last couple of weeks as well.  No fever or chills or other sinus symptoms but she has had a persistent cough  ROS      Objective:    BP (!) 164/65   Pulse 72   Ht '5\' 3"'$  (1.6 m)   Wt 125 lb (56.7 kg)   SpO2 98%   BMI 22.14 kg/m  {Vitals History (Optional):23777}  Physical Exam Vitals and nursing note reviewed.  Constitutional:      Appearance: She is well-developed.  HENT:     Head: Normocephalic and atraumatic.  Cardiovascular:     Rate and Rhythm: Normal rate and regular rhythm.     Heart sounds: Normal heart sounds.  Pulmonary:     Effort: Pulmonary effort is normal.     Breath sounds: Normal breath sounds.  Skin:     General: Skin is warm and dry.  Neurological:     Mental Status: She is alert and oriented to person, place, and time.  Psychiatric:        Behavior: Behavior normal.     No results found for any visits on 01/22/22.      Assessment & Plan:   Problem List Items Addressed This Visit       Cardiovascular and Mediastinum   Essential hypertension - Primary   Relevant Orders   DG Chest 2 View   COMPLETE METABOLIC PANEL WITH GFR   TSH   CBC   Other Visit Diagnoses     Acute cough       Relevant Orders   DG Chest 2 View   COMPLETE METABOLIC PANEL WITH GFR   TSH   CBC   Dizziness       Relevant Orders   DG Chest 2 View   COMPLETE METABOLIC PANEL WITH GFR   TSH   CBC   Nausea       Relevant Orders   DG Chest 2 View   COMPLETE METABOLIC PANEL WITH GFR   TSH   CBC  Daily headache          Acute onset cough-consider infection versus GERD etc.  She does have a history of asthma.  Though she is not feeling short of breath we will get a chest x-ray today for further work-up.  Dizziness-unclear etiology at this point.  Her blood pressures have been a little elevated though a little better at home.  That certainly could be contributing.  We will check labs today for abnormalities.  Consider brain MRI for further work-up.  She did have a head CT in January 2023 at [facility].  There were no acute findings just evidence of old infarction at the cerebral artery territory and old lacunar infarct of the right basal ganglia.   No orders of the defined types were placed in this encounter.   Return if symptoms worsen or fail to improve.  Beatrice Lecher, MD

## 2022-01-22 NOTE — Progress Notes (Unsigned)
he

## 2022-01-23 LAB — COMPLETE METABOLIC PANEL WITH GFR
AG Ratio: 1.6 (calc) (ref 1.0–2.5)
ALT: 16 U/L (ref 6–29)
AST: 19 U/L (ref 10–35)
Albumin: 4.1 g/dL (ref 3.6–5.1)
Alkaline phosphatase (APISO): 50 U/L (ref 37–153)
BUN/Creatinine Ratio: 15 (calc) (ref 6–22)
BUN: 17 mg/dL (ref 7–25)
CO2: 26 mmol/L (ref 20–32)
Calcium: 9.9 mg/dL (ref 8.6–10.4)
Chloride: 101 mmol/L (ref 98–110)
Creat: 1.14 mg/dL — ABNORMAL HIGH (ref 0.60–0.95)
Globulin: 2.5 g/dL (calc) (ref 1.9–3.7)
Glucose, Bld: 84 mg/dL (ref 65–99)
Potassium: 4.6 mmol/L (ref 3.5–5.3)
Sodium: 137 mmol/L (ref 135–146)
Total Bilirubin: 0.4 mg/dL (ref 0.2–1.2)
Total Protein: 6.6 g/dL (ref 6.1–8.1)
eGFR: 47 mL/min/{1.73_m2} — ABNORMAL LOW (ref >=60)

## 2022-01-23 LAB — CBC
HCT: 40.4 % (ref 35.0–45.0)
Hemoglobin: 13.5 g/dL (ref 11.7–15.5)
MCH: 31 pg (ref 27.0–33.0)
MCHC: 33.4 g/dL (ref 32.0–36.0)
MCV: 92.7 fL (ref 80.0–100.0)
MPV: 10.5 fL (ref 7.5–12.5)
Platelets: 277 10*3/uL (ref 140–400)
RBC: 4.36 10*6/uL (ref 3.80–5.10)
RDW: 13 % (ref 11.0–15.0)
WBC: 11 10*3/uL — ABNORMAL HIGH (ref 3.8–10.8)

## 2022-01-23 LAB — TSH: TSH: 3.98 mIU/L (ref 0.40–4.50)

## 2022-01-26 ENCOUNTER — Ambulatory Visit (INDEPENDENT_AMBULATORY_CARE_PROVIDER_SITE_OTHER): Payer: Medicare Other

## 2022-01-26 DIAGNOSIS — R110 Nausea: Secondary | ICD-10-CM | POA: Diagnosis not present

## 2022-01-26 DIAGNOSIS — R519 Headache, unspecified: Secondary | ICD-10-CM | POA: Diagnosis not present

## 2022-01-26 DIAGNOSIS — R42 Dizziness and giddiness: Secondary | ICD-10-CM

## 2022-01-26 MED ORDER — GADOBUTROL 1 MMOL/ML IV SOLN
6.0000 mL | Freq: Once | INTRAVENOUS | Status: AC | PRN
  Administered 2022-01-26: 6 mL via INTRAVENOUS

## 2022-01-27 ENCOUNTER — Telehealth: Payer: Self-pay | Admitting: Family Medicine

## 2022-01-27 MED ORDER — PREDNISONE 20 MG PO TABS: 20.0000 mg | ORAL_TABLET | Freq: Every day | ORAL | 0 refills | Status: DC

## 2022-01-27 NOTE — Telephone Encounter (Signed)
Please call patient and let her know that I did review the blood pressures that they dropped off.  There are several that look good in the 130s.  But there are also several in the 160s.  Lets have her try to increase the metoprolol to 2 tabs daily and see if that improves the blood pressure numbers.  If they contract those for a couple of weeks checking maybe every other day that would be fantastic.

## 2022-01-28 ENCOUNTER — Ambulatory Visit (INDEPENDENT_AMBULATORY_CARE_PROVIDER_SITE_OTHER): Payer: Medicare Other

## 2022-01-28 ENCOUNTER — Encounter: Payer: Self-pay | Admitting: Family Medicine

## 2022-01-28 DIAGNOSIS — Z780 Asymptomatic menopausal state: Secondary | ICD-10-CM

## 2022-01-28 DIAGNOSIS — M810 Age-related osteoporosis without current pathological fracture: Secondary | ICD-10-CM | POA: Diagnosis not present

## 2022-01-28 DIAGNOSIS — M8588 Other specified disorders of bone density and structure, other site: Secondary | ICD-10-CM | POA: Diagnosis not present

## 2022-01-28 DIAGNOSIS — Z0000 Encounter for general adult medical examination without abnormal findings: Secondary | ICD-10-CM | POA: Diagnosis not present

## 2022-01-28 MED ORDER — ALENDRONATE SODIUM 70 MG PO TABS: 70.0000 mg | ORAL_TABLET | ORAL | 3 refills | Status: DC

## 2022-01-28 NOTE — Progress Notes (Signed)
Prescription sent for Fosamax.  Do not start until after completing the steroids.  Orders Placed This Encounter     alendronate (FOSAMAX) 70 MG tablet         Sig: Take 1 tablet (70 mg total) by mouth every 7 (seven) days. Take with a full glass of water on an empty stomach.         Dispense:  12 tablet         Refill:  3

## 2022-01-28 NOTE — Progress Notes (Signed)
Med sent.

## 2022-01-28 NOTE — Telephone Encounter (Signed)
Patients daughter [name]) advised of message and verbalized understanding.

## 2022-01-28 NOTE — Progress Notes (Signed)
Hi Erika Lynch, your bone density shows a T score -3.2.  This is consistent with having osteoporosis or thin bones.   The current recommendation for osteoporosis treatment includes:   #1 calcium-total of 1200 mg of calcium daily.  If you eat a very calcium rich diet you may be able to obtain that without a supplement.  If not, then I recommend calcium 500 mg twice a day.  There are several products over-the-counter such as Caltrate D and Viactiv chews which are great options that contain calcium and vitamin D. #2 vitamin D-recommend 800 international units daily. #3 exercise-recommend 30 minutes of weightbearing exercise 3 days a week.  Resistance training ,such as doing bands and light weights, can be particularly helpful. #4 medication-if you are not currently on a bone builder, also called a bisphosphonate, then this has been shown to be very helpful in maintaining bone strength, preventing further thinning of the bones, and reducing your risk for fractures.  I would highly recommend that you consider starting 1 of these medications.  If you are okay with that then please let us know and we will send one to your pharmacy.  If you would like to discuss further we are happy to make an appointment for you so that we can go over options for treatment.

## 2022-02-04 ENCOUNTER — Other Ambulatory Visit: Payer: Self-pay | Admitting: Family Medicine

## 2022-02-04 DIAGNOSIS — J449 Chronic obstructive pulmonary disease, unspecified: Secondary | ICD-10-CM

## 2022-02-10 ENCOUNTER — Encounter: Payer: Self-pay | Admitting: Family Medicine

## 2022-02-10 ENCOUNTER — Ambulatory Visit (INDEPENDENT_AMBULATORY_CARE_PROVIDER_SITE_OTHER): Payer: Medicare Other | Admitting: Family Medicine

## 2022-02-10 VITALS — BP 109/52 | HR 78 | Wt 126.0 lb

## 2022-02-10 DIAGNOSIS — R200 Anesthesia of skin: Secondary | ICD-10-CM | POA: Diagnosis not present

## 2022-02-10 DIAGNOSIS — I75023 Atheroembolism of bilateral lower extremities: Secondary | ICD-10-CM | POA: Diagnosis not present

## 2022-02-10 DIAGNOSIS — I10 Essential (primary) hypertension: Secondary | ICD-10-CM

## 2022-02-10 DIAGNOSIS — R202 Paresthesia of skin: Secondary | ICD-10-CM | POA: Diagnosis not present

## 2022-02-10 DIAGNOSIS — R42 Dizziness and giddiness: Secondary | ICD-10-CM | POA: Diagnosis not present

## 2022-02-10 MED ORDER — METOPROLOL SUCCINATE ER 50 MG PO TB24: 50.0000 mg | ORAL_TABLET | Freq: Every day | ORAL | 1 refills | Status: DC

## 2022-02-10 NOTE — Assessment & Plan Note (Addendum)
Home blood pressures overall look good though she has a few spikes in blood pressure.  We will switch her 25 mg metoprolol to the 50 mg so that she does not have to take 2.  We also discussed taking an extra half of a tab if her blood pressure is greater than 160 or if her pulse is greater than 100.  Blood pressure looks fantastic here today.  Also encouraged her to make sure that she is sitting and relaxing for 5 minutes before she takes her blood pressure.  Follow up in  38mo

## 2022-02-10 NOTE — Assessment & Plan Note (Signed)
Experiencing dizziness and vertigo.  She did well with home vestibular rehab previously so we will place an order again.  If she is not improving then the neck step would be to refer her to neurology she is already had a brain MRI

## 2022-02-10 NOTE — Progress Notes (Signed)
  Home bp cuff: 122/70 P:82

## 2022-02-10 NOTE — Progress Notes (Signed)
Established Patient Office Visit  Subjective   Patient ID: Erika Lynch, female    DOB: 1932-12-30  Age: 88 y.o. MRN: [id number]  Chief Complaint  Patient presents with   Hypertension    HPI  Follow-up frequent headaches-did have a brain MRI MRI with no acute abnormalities it did just show some chronic encephalomalacia and some old remote lacunar infarcts on the right.  Moderate atrophy.  The headaches seem to be better.  But they are often associated with the vertigo which she is still experiencing.  Hypertension follow-up-she did bring in some home blood pressures.  The last several days have been fantastic mostly running in the 120s to 130s.  Prior to that she was running in the 150s and 170s at times.  She is tolerating the increased dose on the metoprolol well she has been taking 2 of her 25 mg dose.  Brought in home blood pressure cuff today but we were getting about a 13 point difference on the systolic pressure.  She did have the nurse come out from her insurance company yesterday and they noted that she had great decrease sensation in her feet.`she says her feet turn blue sometimes as well.    ROS    Objective:     BP (!) 109/52   Pulse 78   Wt 126 lb (57.2 kg)   SpO2 96%   BMI 22.32 kg/m    Physical Exam Vitals and nursing note reviewed.  Constitutional:      Appearance: She is well-developed.  HENT:     Head: Normocephalic and atraumatic.  Cardiovascular:     Rate and Rhythm: Normal rate and regular rhythm.     Heart sounds: Normal heart sounds.  Pulmonary:     Effort: Pulmonary effort is normal.     Breath sounds: Normal breath sounds.  Skin:    General: Skin is warm and dry.  Neurological:     Mental Status: She is alert and oriented to person, place, and time.  Psychiatric:        Behavior: Behavior normal.      No results found for any visits on 02/10/22.    The ASCVD Risk score (Arnett DK, et al., 2019) failed to calculate for the  following reasons:   The 2019 ASCVD risk score is only valid for ages 43 to 61   The patient has a prior MI or stroke diagnosis    Assessment & Plan:   Problem List Items Addressed This Visit       Cardiovascular and Mediastinum   Essential hypertension - Primary    Home blood pressures overall look good though she has a few spikes in blood pressure.  We will switch her 25 mg metoprolol to the 50 mg so that she does not have to take 2.  We also discussed taking an extra half of a tab if her blood pressure is greater than 160 or if her pulse is greater than 100.  Blood pressure looks fantastic here today.  Also encouraged her to make sure that she is sitting and relaxing for 5 minutes before she takes her blood pressure.  Follow up in  8mo      Relevant Medications   metoprolol succinate (TOPROL-XL) 50 MG 24 hr tablet   Other Relevant Orders   Ambulatory referral to [facility]    Other   Dizziness    Experiencing dizziness and vertigo.  She did well with home vestibular rehab previously  so we will place an order again.  If she is not improving then the neck step would be to refer her to neurology she is already had a brain MRI      Relevant Orders   Ambulatory referral to [facility]   Other Visit Diagnoses     Blue toe syndrome, bilateral (HCC)       Relevant Medications   metoprolol succinate (TOPROL-XL) 50 MG 24 hr tablet   Other Relevant Orders   VAS [place] ABI WITH/WO TBI   Vitamin B6   B12   Vitamin B1   Ambulatory referral to [facility]   Numbness and tingling of both feet       Relevant Orders   Vitamin B6   B12   Vitamin B1   Ambulatory referral to [facility]-we discussed getting ABIs to check for peripheral vascular disease.  Numbness and  tingling in both feet-could be related to neuropathy.  She does have degenerative disc disease of the lumbar spine and has been having more back pain lately.  We can also do some labs just to check for  deficiency which can become more common as people age.  We discussed other causes for neuropathy as well.  She is not really having pain its more numbness and tingling so I do not recommend any prescription treatment unless we find a deficiency.  No follow-ups on file.    Beatrice Lecher, MD

## 2022-02-15 LAB — VITAMIN B6: Vitamin B6: 15.9 ng/mL (ref 2.1–21.7)

## 2022-02-15 LAB — VITAMIN B12: Vitamin B-12: 479 pg/mL (ref 200–1100)

## 2022-02-15 LAB — VITAMIN B1: Vitamin B1 (Thiamine): 27 nmol/L (ref 8–30)

## 2022-02-16 ENCOUNTER — Other Ambulatory Visit: Payer: Self-pay | Admitting: Sports Medicine

## 2022-02-16 DIAGNOSIS — I10 Essential (primary) hypertension: Secondary | ICD-10-CM

## 2022-02-16 NOTE — Progress Notes (Signed)
HI Jacqulyn, your vitamin B6, B12 and B1 all look great.

## 2022-02-17 DIAGNOSIS — M48061 Spinal stenosis, lumbar region without neurogenic claudication: Secondary | ICD-10-CM | POA: Diagnosis not present

## 2022-02-17 DIAGNOSIS — Z720 Tobacco use: Secondary | ICD-10-CM | POA: Diagnosis not present

## 2022-02-17 DIAGNOSIS — J449 Chronic obstructive pulmonary disease, unspecified: Secondary | ICD-10-CM | POA: Diagnosis not present

## 2022-02-17 DIAGNOSIS — E538 Deficiency of other specified B group vitamins: Secondary | ICD-10-CM | POA: Diagnosis not present

## 2022-02-17 DIAGNOSIS — M810 Age-related osteoporosis without current pathological fracture: Secondary | ICD-10-CM | POA: Diagnosis not present

## 2022-02-17 DIAGNOSIS — I129 Hypertensive chronic kidney disease with stage 1 through stage 4 chronic kidney disease, or unspecified chronic kidney disease: Secondary | ICD-10-CM | POA: Diagnosis not present

## 2022-02-17 DIAGNOSIS — Z7902 Long term (current) use of antithrombotics/antiplatelets: Secondary | ICD-10-CM | POA: Diagnosis not present

## 2022-02-17 DIAGNOSIS — M5136 Other intervertebral disc degeneration, lumbar region: Secondary | ICD-10-CM | POA: Diagnosis not present

## 2022-02-17 DIAGNOSIS — M545 Low back pain, unspecified: Secondary | ICD-10-CM | POA: Diagnosis not present

## 2022-02-17 DIAGNOSIS — J45909 Unspecified asthma, uncomplicated: Secondary | ICD-10-CM | POA: Diagnosis not present

## 2022-02-17 DIAGNOSIS — R7301 Impaired fasting glucose: Secondary | ICD-10-CM | POA: Diagnosis not present

## 2022-02-17 DIAGNOSIS — I75023 Atheroembolism of bilateral lower extremities: Secondary | ICD-10-CM | POA: Diagnosis not present

## 2022-02-17 DIAGNOSIS — Z8673 Personal history of transient ischemic attack (TIA), and cerebral infarction without residual deficits: Secondary | ICD-10-CM | POA: Diagnosis not present

## 2022-02-17 DIAGNOSIS — R42 Dizziness and giddiness: Secondary | ICD-10-CM | POA: Diagnosis not present

## 2022-02-17 DIAGNOSIS — E559 Vitamin D deficiency, unspecified: Secondary | ICD-10-CM | POA: Diagnosis not present

## 2022-02-17 DIAGNOSIS — K219 Gastro-esophageal reflux disease without esophagitis: Secondary | ICD-10-CM | POA: Diagnosis not present

## 2022-02-17 DIAGNOSIS — E785 Hyperlipidemia, unspecified: Secondary | ICD-10-CM | POA: Diagnosis not present

## 2022-02-17 DIAGNOSIS — H8113 Benign paroxysmal vertigo, bilateral: Secondary | ICD-10-CM | POA: Diagnosis not present

## 2022-02-17 DIAGNOSIS — N1831 Chronic kidney disease, stage 3a: Secondary | ICD-10-CM | POA: Diagnosis not present

## 2022-02-20 DIAGNOSIS — N1831 Chronic kidney disease, stage 3a: Secondary | ICD-10-CM | POA: Diagnosis not present

## 2022-02-20 DIAGNOSIS — Z720 Tobacco use: Secondary | ICD-10-CM | POA: Diagnosis not present

## 2022-02-20 DIAGNOSIS — M5136 Other intervertebral disc degeneration, lumbar region: Secondary | ICD-10-CM | POA: Diagnosis not present

## 2022-02-20 DIAGNOSIS — H8113 Benign paroxysmal vertigo, bilateral: Secondary | ICD-10-CM | POA: Diagnosis not present

## 2022-02-20 DIAGNOSIS — I129 Hypertensive chronic kidney disease with stage 1 through stage 4 chronic kidney disease, or unspecified chronic kidney disease: Secondary | ICD-10-CM | POA: Diagnosis not present

## 2022-02-20 DIAGNOSIS — I75023 Atheroembolism of bilateral lower extremities: Secondary | ICD-10-CM | POA: Diagnosis not present

## 2022-02-20 DIAGNOSIS — J449 Chronic obstructive pulmonary disease, unspecified: Secondary | ICD-10-CM | POA: Diagnosis not present

## 2022-02-20 DIAGNOSIS — M810 Age-related osteoporosis without current pathological fracture: Secondary | ICD-10-CM | POA: Diagnosis not present

## 2022-02-20 DIAGNOSIS — E785 Hyperlipidemia, unspecified: Secondary | ICD-10-CM | POA: Diagnosis not present

## 2022-02-20 DIAGNOSIS — E559 Vitamin D deficiency, unspecified: Secondary | ICD-10-CM | POA: Diagnosis not present

## 2022-02-20 DIAGNOSIS — K219 Gastro-esophageal reflux disease without esophagitis: Secondary | ICD-10-CM | POA: Diagnosis not present

## 2022-02-20 DIAGNOSIS — E538 Deficiency of other specified B group vitamins: Secondary | ICD-10-CM | POA: Diagnosis not present

## 2022-02-20 DIAGNOSIS — J45909 Unspecified asthma, uncomplicated: Secondary | ICD-10-CM | POA: Diagnosis not present

## 2022-02-20 DIAGNOSIS — M48061 Spinal stenosis, lumbar region without neurogenic claudication: Secondary | ICD-10-CM | POA: Diagnosis not present

## 2022-02-20 DIAGNOSIS — R7301 Impaired fasting glucose: Secondary | ICD-10-CM | POA: Diagnosis not present

## 2022-02-20 DIAGNOSIS — M545 Low back pain, unspecified: Secondary | ICD-10-CM | POA: Diagnosis not present

## 2022-02-20 DIAGNOSIS — R42 Dizziness and giddiness: Secondary | ICD-10-CM | POA: Diagnosis not present

## 2022-02-20 DIAGNOSIS — Z8673 Personal history of transient ischemic attack (TIA), and cerebral infarction without residual deficits: Secondary | ICD-10-CM | POA: Diagnosis not present

## 2022-02-20 DIAGNOSIS — Z7902 Long term (current) use of antithrombotics/antiplatelets: Secondary | ICD-10-CM | POA: Diagnosis not present

## 2022-02-24 DIAGNOSIS — E785 Hyperlipidemia, unspecified: Secondary | ICD-10-CM | POA: Diagnosis not present

## 2022-02-24 DIAGNOSIS — E559 Vitamin D deficiency, unspecified: Secondary | ICD-10-CM | POA: Diagnosis not present

## 2022-02-24 DIAGNOSIS — Z8673 Personal history of transient ischemic attack (TIA), and cerebral infarction without residual deficits: Secondary | ICD-10-CM | POA: Diagnosis not present

## 2022-02-24 DIAGNOSIS — M48061 Spinal stenosis, lumbar region without neurogenic claudication: Secondary | ICD-10-CM | POA: Diagnosis not present

## 2022-02-24 DIAGNOSIS — R7301 Impaired fasting glucose: Secondary | ICD-10-CM | POA: Diagnosis not present

## 2022-02-24 DIAGNOSIS — E538 Deficiency of other specified B group vitamins: Secondary | ICD-10-CM | POA: Diagnosis not present

## 2022-02-24 DIAGNOSIS — Z720 Tobacco use: Secondary | ICD-10-CM | POA: Diagnosis not present

## 2022-02-24 DIAGNOSIS — I129 Hypertensive chronic kidney disease with stage 1 through stage 4 chronic kidney disease, or unspecified chronic kidney disease: Secondary | ICD-10-CM | POA: Diagnosis not present

## 2022-02-24 DIAGNOSIS — K219 Gastro-esophageal reflux disease without esophagitis: Secondary | ICD-10-CM | POA: Diagnosis not present

## 2022-02-24 DIAGNOSIS — M5136 Other intervertebral disc degeneration, lumbar region: Secondary | ICD-10-CM | POA: Diagnosis not present

## 2022-02-24 DIAGNOSIS — R42 Dizziness and giddiness: Secondary | ICD-10-CM | POA: Diagnosis not present

## 2022-02-24 DIAGNOSIS — M810 Age-related osteoporosis without current pathological fracture: Secondary | ICD-10-CM | POA: Diagnosis not present

## 2022-02-24 DIAGNOSIS — H8113 Benign paroxysmal vertigo, bilateral: Secondary | ICD-10-CM | POA: Diagnosis not present

## 2022-02-24 DIAGNOSIS — I75023 Atheroembolism of bilateral lower extremities: Secondary | ICD-10-CM | POA: Diagnosis not present

## 2022-02-24 DIAGNOSIS — N1831 Chronic kidney disease, stage 3a: Secondary | ICD-10-CM | POA: Diagnosis not present

## 2022-02-24 DIAGNOSIS — J449 Chronic obstructive pulmonary disease, unspecified: Secondary | ICD-10-CM | POA: Diagnosis not present

## 2022-02-24 DIAGNOSIS — Z7902 Long term (current) use of antithrombotics/antiplatelets: Secondary | ICD-10-CM | POA: Diagnosis not present

## 2022-02-24 DIAGNOSIS — M545 Low back pain, unspecified: Secondary | ICD-10-CM | POA: Diagnosis not present

## 2022-02-24 DIAGNOSIS — J45909 Unspecified asthma, uncomplicated: Secondary | ICD-10-CM | POA: Diagnosis not present

## 2022-02-26 DIAGNOSIS — K219 Gastro-esophageal reflux disease without esophagitis: Secondary | ICD-10-CM | POA: Diagnosis not present

## 2022-02-26 DIAGNOSIS — R42 Dizziness and giddiness: Secondary | ICD-10-CM | POA: Diagnosis not present

## 2022-02-26 DIAGNOSIS — M810 Age-related osteoporosis without current pathological fracture: Secondary | ICD-10-CM | POA: Diagnosis not present

## 2022-02-26 DIAGNOSIS — J449 Chronic obstructive pulmonary disease, unspecified: Secondary | ICD-10-CM | POA: Diagnosis not present

## 2022-02-26 DIAGNOSIS — E559 Vitamin D deficiency, unspecified: Secondary | ICD-10-CM | POA: Diagnosis not present

## 2022-02-26 DIAGNOSIS — N1831 Chronic kidney disease, stage 3a: Secondary | ICD-10-CM | POA: Diagnosis not present

## 2022-02-26 DIAGNOSIS — E785 Hyperlipidemia, unspecified: Secondary | ICD-10-CM | POA: Diagnosis not present

## 2022-02-26 DIAGNOSIS — Z8673 Personal history of transient ischemic attack (TIA), and cerebral infarction without residual deficits: Secondary | ICD-10-CM | POA: Diagnosis not present

## 2022-02-26 DIAGNOSIS — H8113 Benign paroxysmal vertigo, bilateral: Secondary | ICD-10-CM | POA: Diagnosis not present

## 2022-02-26 DIAGNOSIS — R7301 Impaired fasting glucose: Secondary | ICD-10-CM | POA: Diagnosis not present

## 2022-02-26 DIAGNOSIS — M48061 Spinal stenosis, lumbar region without neurogenic claudication: Secondary | ICD-10-CM | POA: Diagnosis not present

## 2022-02-26 DIAGNOSIS — M545 Low back pain, unspecified: Secondary | ICD-10-CM | POA: Diagnosis not present

## 2022-02-26 DIAGNOSIS — Z720 Tobacco use: Secondary | ICD-10-CM | POA: Diagnosis not present

## 2022-02-26 DIAGNOSIS — I129 Hypertensive chronic kidney disease with stage 1 through stage 4 chronic kidney disease, or unspecified chronic kidney disease: Secondary | ICD-10-CM | POA: Diagnosis not present

## 2022-02-26 DIAGNOSIS — J45909 Unspecified asthma, uncomplicated: Secondary | ICD-10-CM | POA: Diagnosis not present

## 2022-02-26 DIAGNOSIS — M5136 Other intervertebral disc degeneration, lumbar region: Secondary | ICD-10-CM | POA: Diagnosis not present

## 2022-02-26 DIAGNOSIS — E538 Deficiency of other specified B group vitamins: Secondary | ICD-10-CM | POA: Diagnosis not present

## 2022-02-26 DIAGNOSIS — I75023 Atheroembolism of bilateral lower extremities: Secondary | ICD-10-CM | POA: Diagnosis not present

## 2022-02-26 DIAGNOSIS — Z7902 Long term (current) use of antithrombotics/antiplatelets: Secondary | ICD-10-CM | POA: Diagnosis not present

## 2022-03-02 DIAGNOSIS — Z7902 Long term (current) use of antithrombotics/antiplatelets: Secondary | ICD-10-CM | POA: Diagnosis not present

## 2022-03-02 DIAGNOSIS — H8113 Benign paroxysmal vertigo, bilateral: Secondary | ICD-10-CM | POA: Diagnosis not present

## 2022-03-02 DIAGNOSIS — M48061 Spinal stenosis, lumbar region without neurogenic claudication: Secondary | ICD-10-CM | POA: Diagnosis not present

## 2022-03-02 DIAGNOSIS — M810 Age-related osteoporosis without current pathological fracture: Secondary | ICD-10-CM | POA: Diagnosis not present

## 2022-03-02 DIAGNOSIS — I75023 Atheroembolism of bilateral lower extremities: Secondary | ICD-10-CM | POA: Diagnosis not present

## 2022-03-02 DIAGNOSIS — Z720 Tobacco use: Secondary | ICD-10-CM | POA: Diagnosis not present

## 2022-03-02 DIAGNOSIS — J449 Chronic obstructive pulmonary disease, unspecified: Secondary | ICD-10-CM | POA: Diagnosis not present

## 2022-03-02 DIAGNOSIS — R7301 Impaired fasting glucose: Secondary | ICD-10-CM | POA: Diagnosis not present

## 2022-03-02 DIAGNOSIS — E559 Vitamin D deficiency, unspecified: Secondary | ICD-10-CM | POA: Diagnosis not present

## 2022-03-02 DIAGNOSIS — R42 Dizziness and giddiness: Secondary | ICD-10-CM | POA: Diagnosis not present

## 2022-03-02 DIAGNOSIS — K219 Gastro-esophageal reflux disease without esophagitis: Secondary | ICD-10-CM | POA: Diagnosis not present

## 2022-03-02 DIAGNOSIS — E785 Hyperlipidemia, unspecified: Secondary | ICD-10-CM | POA: Diagnosis not present

## 2022-03-02 DIAGNOSIS — N1831 Chronic kidney disease, stage 3a: Secondary | ICD-10-CM | POA: Diagnosis not present

## 2022-03-02 DIAGNOSIS — Z8673 Personal history of transient ischemic attack (TIA), and cerebral infarction without residual deficits: Secondary | ICD-10-CM | POA: Diagnosis not present

## 2022-03-02 DIAGNOSIS — E538 Deficiency of other specified B group vitamins: Secondary | ICD-10-CM | POA: Diagnosis not present

## 2022-03-02 DIAGNOSIS — J45909 Unspecified asthma, uncomplicated: Secondary | ICD-10-CM | POA: Diagnosis not present

## 2022-03-02 DIAGNOSIS — M545 Low back pain, unspecified: Secondary | ICD-10-CM | POA: Diagnosis not present

## 2022-03-02 DIAGNOSIS — M5136 Other intervertebral disc degeneration, lumbar region: Secondary | ICD-10-CM | POA: Diagnosis not present

## 2022-03-02 DIAGNOSIS — I129 Hypertensive chronic kidney disease with stage 1 through stage 4 chronic kidney disease, or unspecified chronic kidney disease: Secondary | ICD-10-CM | POA: Diagnosis not present

## 2022-03-04 DIAGNOSIS — Z8673 Personal history of transient ischemic attack (TIA), and cerebral infarction without residual deficits: Secondary | ICD-10-CM | POA: Diagnosis not present

## 2022-03-04 DIAGNOSIS — K219 Gastro-esophageal reflux disease without esophagitis: Secondary | ICD-10-CM | POA: Diagnosis not present

## 2022-03-04 DIAGNOSIS — Z7902 Long term (current) use of antithrombotics/antiplatelets: Secondary | ICD-10-CM | POA: Diagnosis not present

## 2022-03-04 DIAGNOSIS — J449 Chronic obstructive pulmonary disease, unspecified: Secondary | ICD-10-CM | POA: Diagnosis not present

## 2022-03-04 DIAGNOSIS — N1831 Chronic kidney disease, stage 3a: Secondary | ICD-10-CM | POA: Diagnosis not present

## 2022-03-04 DIAGNOSIS — J45909 Unspecified asthma, uncomplicated: Secondary | ICD-10-CM | POA: Diagnosis not present

## 2022-03-04 DIAGNOSIS — E538 Deficiency of other specified B group vitamins: Secondary | ICD-10-CM | POA: Diagnosis not present

## 2022-03-04 DIAGNOSIS — E785 Hyperlipidemia, unspecified: Secondary | ICD-10-CM | POA: Diagnosis not present

## 2022-03-04 DIAGNOSIS — Z720 Tobacco use: Secondary | ICD-10-CM | POA: Diagnosis not present

## 2022-03-04 DIAGNOSIS — M810 Age-related osteoporosis without current pathological fracture: Secondary | ICD-10-CM | POA: Diagnosis not present

## 2022-03-04 DIAGNOSIS — I129 Hypertensive chronic kidney disease with stage 1 through stage 4 chronic kidney disease, or unspecified chronic kidney disease: Secondary | ICD-10-CM | POA: Diagnosis not present

## 2022-03-04 DIAGNOSIS — M545 Low back pain, unspecified: Secondary | ICD-10-CM | POA: Diagnosis not present

## 2022-03-04 DIAGNOSIS — I75023 Atheroembolism of bilateral lower extremities: Secondary | ICD-10-CM | POA: Diagnosis not present

## 2022-03-04 DIAGNOSIS — R7301 Impaired fasting glucose: Secondary | ICD-10-CM | POA: Diagnosis not present

## 2022-03-04 DIAGNOSIS — E559 Vitamin D deficiency, unspecified: Secondary | ICD-10-CM | POA: Diagnosis not present

## 2022-03-04 DIAGNOSIS — M5136 Other intervertebral disc degeneration, lumbar region: Secondary | ICD-10-CM | POA: Diagnosis not present

## 2022-03-04 DIAGNOSIS — H8113 Benign paroxysmal vertigo, bilateral: Secondary | ICD-10-CM | POA: Diagnosis not present

## 2022-03-04 DIAGNOSIS — M48061 Spinal stenosis, lumbar region without neurogenic claudication: Secondary | ICD-10-CM | POA: Diagnosis not present

## 2022-03-04 DIAGNOSIS — R42 Dizziness and giddiness: Secondary | ICD-10-CM | POA: Diagnosis not present

## 2022-03-06 ENCOUNTER — Other Ambulatory Visit (HOSPITAL_COMMUNITY): Payer: Self-pay

## 2022-03-09 DIAGNOSIS — E538 Deficiency of other specified B group vitamins: Secondary | ICD-10-CM | POA: Diagnosis not present

## 2022-03-09 DIAGNOSIS — I129 Hypertensive chronic kidney disease with stage 1 through stage 4 chronic kidney disease, or unspecified chronic kidney disease: Secondary | ICD-10-CM | POA: Diagnosis not present

## 2022-03-09 DIAGNOSIS — J45909 Unspecified asthma, uncomplicated: Secondary | ICD-10-CM | POA: Diagnosis not present

## 2022-03-09 DIAGNOSIS — R42 Dizziness and giddiness: Secondary | ICD-10-CM | POA: Diagnosis not present

## 2022-03-09 DIAGNOSIS — R7301 Impaired fasting glucose: Secondary | ICD-10-CM | POA: Diagnosis not present

## 2022-03-09 DIAGNOSIS — M810 Age-related osteoporosis without current pathological fracture: Secondary | ICD-10-CM | POA: Diagnosis not present

## 2022-03-09 DIAGNOSIS — M5136 Other intervertebral disc degeneration, lumbar region: Secondary | ICD-10-CM | POA: Diagnosis not present

## 2022-03-09 DIAGNOSIS — N1831 Chronic kidney disease, stage 3a: Secondary | ICD-10-CM | POA: Diagnosis not present

## 2022-03-09 DIAGNOSIS — M48061 Spinal stenosis, lumbar region without neurogenic claudication: Secondary | ICD-10-CM | POA: Diagnosis not present

## 2022-03-09 DIAGNOSIS — E559 Vitamin D deficiency, unspecified: Secondary | ICD-10-CM | POA: Diagnosis not present

## 2022-03-09 DIAGNOSIS — I75023 Atheroembolism of bilateral lower extremities: Secondary | ICD-10-CM | POA: Diagnosis not present

## 2022-03-09 DIAGNOSIS — Z7902 Long term (current) use of antithrombotics/antiplatelets: Secondary | ICD-10-CM | POA: Diagnosis not present

## 2022-03-09 DIAGNOSIS — H8113 Benign paroxysmal vertigo, bilateral: Secondary | ICD-10-CM | POA: Diagnosis not present

## 2022-03-09 DIAGNOSIS — K219 Gastro-esophageal reflux disease without esophagitis: Secondary | ICD-10-CM | POA: Diagnosis not present

## 2022-03-09 DIAGNOSIS — M545 Low back pain, unspecified: Secondary | ICD-10-CM | POA: Diagnosis not present

## 2022-03-09 DIAGNOSIS — E785 Hyperlipidemia, unspecified: Secondary | ICD-10-CM | POA: Diagnosis not present

## 2022-03-09 DIAGNOSIS — Z720 Tobacco use: Secondary | ICD-10-CM | POA: Diagnosis not present

## 2022-03-09 DIAGNOSIS — Z8673 Personal history of transient ischemic attack (TIA), and cerebral infarction without residual deficits: Secondary | ICD-10-CM | POA: Diagnosis not present

## 2022-03-09 DIAGNOSIS — J449 Chronic obstructive pulmonary disease, unspecified: Secondary | ICD-10-CM | POA: Diagnosis not present

## 2022-03-16 DIAGNOSIS — R42 Dizziness and giddiness: Secondary | ICD-10-CM | POA: Diagnosis not present

## 2022-03-16 DIAGNOSIS — I75023 Atheroembolism of bilateral lower extremities: Secondary | ICD-10-CM | POA: Diagnosis not present

## 2022-03-16 DIAGNOSIS — M5136 Other intervertebral disc degeneration, lumbar region: Secondary | ICD-10-CM | POA: Diagnosis not present

## 2022-03-16 DIAGNOSIS — E785 Hyperlipidemia, unspecified: Secondary | ICD-10-CM | POA: Diagnosis not present

## 2022-03-16 DIAGNOSIS — J449 Chronic obstructive pulmonary disease, unspecified: Secondary | ICD-10-CM | POA: Diagnosis not present

## 2022-03-16 DIAGNOSIS — N1831 Chronic kidney disease, stage 3a: Secondary | ICD-10-CM | POA: Diagnosis not present

## 2022-03-16 DIAGNOSIS — H8113 Benign paroxysmal vertigo, bilateral: Secondary | ICD-10-CM | POA: Diagnosis not present

## 2022-03-16 DIAGNOSIS — E559 Vitamin D deficiency, unspecified: Secondary | ICD-10-CM | POA: Diagnosis not present

## 2022-03-16 DIAGNOSIS — K219 Gastro-esophageal reflux disease without esophagitis: Secondary | ICD-10-CM | POA: Diagnosis not present

## 2022-03-16 DIAGNOSIS — J45909 Unspecified asthma, uncomplicated: Secondary | ICD-10-CM | POA: Diagnosis not present

## 2022-03-16 DIAGNOSIS — Z720 Tobacco use: Secondary | ICD-10-CM | POA: Diagnosis not present

## 2022-03-16 DIAGNOSIS — M545 Low back pain, unspecified: Secondary | ICD-10-CM | POA: Diagnosis not present

## 2022-03-16 DIAGNOSIS — R7301 Impaired fasting glucose: Secondary | ICD-10-CM | POA: Diagnosis not present

## 2022-03-16 DIAGNOSIS — E538 Deficiency of other specified B group vitamins: Secondary | ICD-10-CM | POA: Diagnosis not present

## 2022-03-16 DIAGNOSIS — I129 Hypertensive chronic kidney disease with stage 1 through stage 4 chronic kidney disease, or unspecified chronic kidney disease: Secondary | ICD-10-CM | POA: Diagnosis not present

## 2022-03-16 DIAGNOSIS — Z7902 Long term (current) use of antithrombotics/antiplatelets: Secondary | ICD-10-CM | POA: Diagnosis not present

## 2022-03-16 DIAGNOSIS — Z8673 Personal history of transient ischemic attack (TIA), and cerebral infarction without residual deficits: Secondary | ICD-10-CM | POA: Diagnosis not present

## 2022-03-16 DIAGNOSIS — M810 Age-related osteoporosis without current pathological fracture: Secondary | ICD-10-CM | POA: Diagnosis not present

## 2022-03-16 DIAGNOSIS — M48061 Spinal stenosis, lumbar region without neurogenic claudication: Secondary | ICD-10-CM | POA: Diagnosis not present

## 2022-03-19 DIAGNOSIS — J45909 Unspecified asthma, uncomplicated: Secondary | ICD-10-CM | POA: Diagnosis not present

## 2022-03-19 DIAGNOSIS — Z8673 Personal history of transient ischemic attack (TIA), and cerebral infarction without residual deficits: Secondary | ICD-10-CM | POA: Diagnosis not present

## 2022-03-19 DIAGNOSIS — E559 Vitamin D deficiency, unspecified: Secondary | ICD-10-CM | POA: Diagnosis not present

## 2022-03-19 DIAGNOSIS — Z720 Tobacco use: Secondary | ICD-10-CM | POA: Diagnosis not present

## 2022-03-19 DIAGNOSIS — K219 Gastro-esophageal reflux disease without esophagitis: Secondary | ICD-10-CM | POA: Diagnosis not present

## 2022-03-19 DIAGNOSIS — E785 Hyperlipidemia, unspecified: Secondary | ICD-10-CM | POA: Diagnosis not present

## 2022-03-19 DIAGNOSIS — R7301 Impaired fasting glucose: Secondary | ICD-10-CM | POA: Diagnosis not present

## 2022-03-19 DIAGNOSIS — J449 Chronic obstructive pulmonary disease, unspecified: Secondary | ICD-10-CM | POA: Diagnosis not present

## 2022-03-19 DIAGNOSIS — N1831 Chronic kidney disease, stage 3a: Secondary | ICD-10-CM | POA: Diagnosis not present

## 2022-03-19 DIAGNOSIS — E538 Deficiency of other specified B group vitamins: Secondary | ICD-10-CM | POA: Diagnosis not present

## 2022-03-19 DIAGNOSIS — I129 Hypertensive chronic kidney disease with stage 1 through stage 4 chronic kidney disease, or unspecified chronic kidney disease: Secondary | ICD-10-CM | POA: Diagnosis not present

## 2022-03-19 DIAGNOSIS — M48061 Spinal stenosis, lumbar region without neurogenic claudication: Secondary | ICD-10-CM | POA: Diagnosis not present

## 2022-03-19 DIAGNOSIS — Z7902 Long term (current) use of antithrombotics/antiplatelets: Secondary | ICD-10-CM | POA: Diagnosis not present

## 2022-03-19 DIAGNOSIS — H8113 Benign paroxysmal vertigo, bilateral: Secondary | ICD-10-CM | POA: Diagnosis not present

## 2022-03-19 DIAGNOSIS — I75023 Atheroembolism of bilateral lower extremities: Secondary | ICD-10-CM | POA: Diagnosis not present

## 2022-03-19 DIAGNOSIS — M810 Age-related osteoporosis without current pathological fracture: Secondary | ICD-10-CM | POA: Diagnosis not present

## 2022-03-19 DIAGNOSIS — R42 Dizziness and giddiness: Secondary | ICD-10-CM | POA: Diagnosis not present

## 2022-03-19 DIAGNOSIS — M5136 Other intervertebral disc degeneration, lumbar region: Secondary | ICD-10-CM | POA: Diagnosis not present

## 2022-03-19 DIAGNOSIS — M545 Low back pain, unspecified: Secondary | ICD-10-CM | POA: Diagnosis not present

## 2022-03-23 DIAGNOSIS — Z8673 Personal history of transient ischemic attack (TIA), and cerebral infarction without residual deficits: Secondary | ICD-10-CM | POA: Diagnosis not present

## 2022-03-24 DIAGNOSIS — I75023 Atheroembolism of bilateral lower extremities: Secondary | ICD-10-CM | POA: Diagnosis not present

## 2022-03-24 DIAGNOSIS — N1831 Chronic kidney disease, stage 3a: Secondary | ICD-10-CM | POA: Diagnosis not present

## 2022-03-24 DIAGNOSIS — E785 Hyperlipidemia, unspecified: Secondary | ICD-10-CM | POA: Diagnosis not present

## 2022-03-24 DIAGNOSIS — Z720 Tobacco use: Secondary | ICD-10-CM | POA: Diagnosis not present

## 2022-03-24 DIAGNOSIS — E559 Vitamin D deficiency, unspecified: Secondary | ICD-10-CM | POA: Diagnosis not present

## 2022-03-24 DIAGNOSIS — R42 Dizziness and giddiness: Secondary | ICD-10-CM | POA: Diagnosis not present

## 2022-03-24 DIAGNOSIS — K219 Gastro-esophageal reflux disease without esophagitis: Secondary | ICD-10-CM | POA: Diagnosis not present

## 2022-03-24 DIAGNOSIS — R7301 Impaired fasting glucose: Secondary | ICD-10-CM | POA: Diagnosis not present

## 2022-03-24 DIAGNOSIS — M545 Low back pain, unspecified: Secondary | ICD-10-CM | POA: Diagnosis not present

## 2022-03-24 DIAGNOSIS — I129 Hypertensive chronic kidney disease with stage 1 through stage 4 chronic kidney disease, or unspecified chronic kidney disease: Secondary | ICD-10-CM | POA: Diagnosis not present

## 2022-03-24 DIAGNOSIS — J45909 Unspecified asthma, uncomplicated: Secondary | ICD-10-CM | POA: Diagnosis not present

## 2022-03-24 DIAGNOSIS — M5136 Other intervertebral disc degeneration, lumbar region: Secondary | ICD-10-CM | POA: Diagnosis not present

## 2022-03-24 DIAGNOSIS — J449 Chronic obstructive pulmonary disease, unspecified: Secondary | ICD-10-CM | POA: Diagnosis not present

## 2022-03-24 DIAGNOSIS — H8113 Benign paroxysmal vertigo, bilateral: Secondary | ICD-10-CM | POA: Diagnosis not present

## 2022-03-24 DIAGNOSIS — M48061 Spinal stenosis, lumbar region without neurogenic claudication: Secondary | ICD-10-CM | POA: Diagnosis not present

## 2022-03-24 DIAGNOSIS — E538 Deficiency of other specified B group vitamins: Secondary | ICD-10-CM | POA: Diagnosis not present

## 2022-03-24 DIAGNOSIS — Z7902 Long term (current) use of antithrombotics/antiplatelets: Secondary | ICD-10-CM | POA: Diagnosis not present

## 2022-03-24 DIAGNOSIS — M810 Age-related osteoporosis without current pathological fracture: Secondary | ICD-10-CM | POA: Diagnosis not present

## 2022-03-24 DIAGNOSIS — Z8673 Personal history of transient ischemic attack (TIA), and cerebral infarction without residual deficits: Secondary | ICD-10-CM | POA: Diagnosis not present

## 2022-03-31 DIAGNOSIS — R7301 Impaired fasting glucose: Secondary | ICD-10-CM | POA: Diagnosis not present

## 2022-03-31 DIAGNOSIS — E785 Hyperlipidemia, unspecified: Secondary | ICD-10-CM | POA: Diagnosis not present

## 2022-03-31 DIAGNOSIS — J45909 Unspecified asthma, uncomplicated: Secondary | ICD-10-CM | POA: Diagnosis not present

## 2022-03-31 DIAGNOSIS — M5136 Other intervertebral disc degeneration, lumbar region: Secondary | ICD-10-CM | POA: Diagnosis not present

## 2022-03-31 DIAGNOSIS — Z720 Tobacco use: Secondary | ICD-10-CM | POA: Diagnosis not present

## 2022-03-31 DIAGNOSIS — M48061 Spinal stenosis, lumbar region without neurogenic claudication: Secondary | ICD-10-CM | POA: Diagnosis not present

## 2022-03-31 DIAGNOSIS — E538 Deficiency of other specified B group vitamins: Secondary | ICD-10-CM | POA: Diagnosis not present

## 2022-03-31 DIAGNOSIS — M545 Low back pain, unspecified: Secondary | ICD-10-CM | POA: Diagnosis not present

## 2022-03-31 DIAGNOSIS — K219 Gastro-esophageal reflux disease without esophagitis: Secondary | ICD-10-CM | POA: Diagnosis not present

## 2022-03-31 DIAGNOSIS — Z8673 Personal history of transient ischemic attack (TIA), and cerebral infarction without residual deficits: Secondary | ICD-10-CM | POA: Diagnosis not present

## 2022-03-31 DIAGNOSIS — H8113 Benign paroxysmal vertigo, bilateral: Secondary | ICD-10-CM | POA: Diagnosis not present

## 2022-03-31 DIAGNOSIS — I75023 Atheroembolism of bilateral lower extremities: Secondary | ICD-10-CM | POA: Diagnosis not present

## 2022-03-31 DIAGNOSIS — Z7902 Long term (current) use of antithrombotics/antiplatelets: Secondary | ICD-10-CM | POA: Diagnosis not present

## 2022-03-31 DIAGNOSIS — N1831 Chronic kidney disease, stage 3a: Secondary | ICD-10-CM | POA: Diagnosis not present

## 2022-03-31 DIAGNOSIS — E559 Vitamin D deficiency, unspecified: Secondary | ICD-10-CM | POA: Diagnosis not present

## 2022-03-31 DIAGNOSIS — M810 Age-related osteoporosis without current pathological fracture: Secondary | ICD-10-CM | POA: Diagnosis not present

## 2022-03-31 DIAGNOSIS — J449 Chronic obstructive pulmonary disease, unspecified: Secondary | ICD-10-CM | POA: Diagnosis not present

## 2022-03-31 DIAGNOSIS — I129 Hypertensive chronic kidney disease with stage 1 through stage 4 chronic kidney disease, or unspecified chronic kidney disease: Secondary | ICD-10-CM | POA: Diagnosis not present

## 2022-03-31 DIAGNOSIS — R42 Dizziness and giddiness: Secondary | ICD-10-CM | POA: Diagnosis not present

## 2022-04-01 ENCOUNTER — Other Ambulatory Visit: Payer: Self-pay | Admitting: Family Medicine

## 2022-04-01 DIAGNOSIS — R110 Nausea: Secondary | ICD-10-CM

## 2022-04-01 DIAGNOSIS — F4329 Adjustment disorder with other symptoms: Secondary | ICD-10-CM

## 2022-04-01 DIAGNOSIS — R42 Dizziness and giddiness: Secondary | ICD-10-CM

## 2022-04-04 ENCOUNTER — Ambulatory Visit (INDEPENDENT_AMBULATORY_CARE_PROVIDER_SITE_OTHER): Payer: Medicare Other

## 2022-04-04 ENCOUNTER — Encounter: Payer: Self-pay | Admitting: Emergency Medicine

## 2022-04-04 ENCOUNTER — Ambulatory Visit
Admission: EM | Admit: 2022-04-04 | Discharge: 2022-04-04 | Disposition: A | Discharge status: Home / Self Care | Payer: Medicare Other | Attending: Family Medicine | Admitting: Family Medicine

## 2022-04-04 DIAGNOSIS — R5381 Other malaise: Secondary | ICD-10-CM

## 2022-04-04 DIAGNOSIS — R053 Chronic cough: Secondary | ICD-10-CM

## 2022-04-04 DIAGNOSIS — R519 Headache, unspecified: Secondary | ICD-10-CM | POA: Diagnosis not present

## 2022-04-04 DIAGNOSIS — R059 Cough, unspecified: Secondary | ICD-10-CM

## 2022-04-04 DIAGNOSIS — R0982 Postnasal drip: Secondary | ICD-10-CM

## 2022-04-04 MED ORDER — CEPHALEXIN 250 MG PO CAPS
250.0000 mg | ORAL_CAPSULE | Freq: Three times a day (TID) | ORAL | 0 refills | Status: DC
Start: 2022-04-04 — End: 2022-04-20

## 2022-04-04 MED ORDER — MONTELUKAST SODIUM 10 MG PO TABS: 10.0000 mg | ORAL_TABLET | Freq: Every day | ORAL | 0 refills | Status: DC

## 2022-04-04 MED ORDER — IBUPROFEN 600 MG PO TABS: 600.0000 mg | ORAL_TABLET | Freq: Four times a day (QID) | ORAL | 0 refills | Status: DC | PRN

## 2022-04-04 NOTE — ED Provider Notes (Signed)
[name]    CSN: [id number] Arrival date & time: 04/04/22  1416      History   Chief Complaint Chief Complaint  Patient presents with   Headache    HPI Erika Lynch is a 88 y.o. female.   HPI Patient is here with some vague complaints.  She states "I just do not feel right".  Daughter states she has had increased shortness of breath.  Patient states she does not think it is her breathing.  She is having postnasal drip and coughing.  When she coughs she feels pain between her shoulder blades in her back.  She does have some sinus congestion but no runny nose.  No sore throat.  No fever or chills.  She does have headache and some body ache.  Her symptoms been going on for over a week.  Her blood pressure was elevated at home.  She did take some Tylenol but it does not helping with the headache.  Past Medical History:  Diagnosis Date   Cataract    Dyskinesia of esophagus    Hypertension    Kidney stone    Stroke ([place]) 08/09/2021   Uterine cancer ([place])    Vertigo 07/16/2020    Patient Active Problem List   Diagnosis Date Noted   Dizziness 02/10/2022   Osteoporosis 01/28/2022   Stress and adjustment reaction 10/09/2021   Tobacco abuse 09/03/2021   Cerebrovascular accident (CVA) due to embolism of left middle cerebral artery ([place]) 08/13/2021   Vitamin D deficiency 10/25/2019   PVC (premature ventricular contraction) 10/23/2019   BPPV (benign paroxysmal positional vertigo) 08/14/2019   IFG (impaired fasting glucose) 08/08/2018   DDD (degenerative disc disease), cervical 07/04/2018   Urine frequency 04/06/2017   History of uterine cancer 04/06/2017   Microscopic hematuria 04/06/2017   History of esophageal stricture 12/17/2016   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   Cervical disc disease 06/13/2012   Lumbar spinal stenosis 06/13/2012   BACK PAIN, LUMBAR 12/09/2009   PALPITATIONS 05/28/2008   Vitamin B12 deficiency 01/04/2008   GERD 12/29/2007    DYSKINESIA OF ESOPHAGUS 12/08/2007   CKD stage G3a/A1, GFR 45-59 and albumin creatinine ratio <30 mg/g ([place]) 10/28/2007   NECK PAIN 10/26/2007   CAROTID BRUIT, LEFT 10/26/2007   Hyperlipidemia 10/12/2007   Essential hypertension 10/12/2007    Past Surgical History:  Procedure Laterality Date   ABDOMINAL HYSTERECTOMY     Uterine cancer   BACK SURGERY     CATARACT EXTRACTION, BILATERAL      OB History   No obstetric history on file.      Home Medications    Prior to Admission medications   Medication Sig Start Date End Date Taking? Authorizing Provider  albuterol (VENTOLIN HFA) 108 (90 Base) MCG/ACT inhaler Inhale 2 puffs into the lungs every 6 (six) hours as needed for wheezing or shortness of breath. 02/04/22  Yes Hali Marry, MD  cephALEXin (KEFLEX) 250 MG capsule Take 1 capsule (250 mg total) by mouth 3 (three) times daily. 04/04/22  Yes Raylene Everts, MD  ibuprofen (ADVIL) 600 MG tablet Take 1 tablet (600 mg total) by mouth every 6 (six) hours as needed. 04/04/22  Yes Raylene Everts, MD  montelukast (SINGULAIR) 10 MG tablet Take 1 tablet (10 mg total) by mouth at bedtime. 04/04/22  Yes Raylene Everts, MD  acetaminophen (TYLENOL) 325 MG tablet Take by mouth. 08/15/21   [provider]  alendronate (FOSAMAX) 70 MG tablet Take 1  tablet (70 mg total) by mouth every 7 (seven) days. Take with a full glass of water on an empty stomach. 01/28/22   Hali Marry, MD  atorvastatin (LIPITOR) 20 MG tablet Take 1 tablet (20 mg total) by mouth daily. 12/24/21   Hali Marry, MD  calcium carbonate (TUMS EX) 750 MG chewable tablet Chew 1 tablet by mouth daily.    [provider]  clopidogrel (PLAVIX) 75 MG tablet Take 1 tablet (75 mg total) by mouth daily. 10/09/21   Hali Marry, MD  DULoxetine (CYMBALTA) 30 MG capsule Take 1 capsule (30 mg total) by mouth daily. 04/02/22   Hali Marry, MD  loratadine (CLARITIN) 10 MG tablet Take  1 tablet (10 mg total) by mouth daily. 12/08/21   Samuel Bouche, NP  meclizine (ANTIVERT) 25 MG tablet Take 1 tablet (25 mg total) by mouth 3 (three) times daily as needed for dizziness. 04/02/22   Hali Marry, MD  metoprolol succinate (TOPROL-XL) 50 MG 24 hr tablet Take 1 tablet (50 mg total) by mouth daily. 02/10/22   Hali Marry, MD  omeprazole (PRILOSEC) 40 MG capsule Take 40 mg by mouth daily. 02/12/22   [provider]  ondansetron (ZOFRAN-ODT) 4 MG disintegrating tablet Take 1 tablet (4 mg total) by mouth every 8 (eight) hours as needed for nausea or vomiting. 04/02/22   Hali Marry, MD  Tiotropium Bromide-Olodaterol (STIOLTO RESPIMAT) 2.5-2.5 MCG/ACT AERS Inhale 2 puffs into the lungs daily. 09/02/21   Hali Marry, MD    Family History Family History  Problem Relation Age of Onset   Heart attack Father    Stroke Father    Colon cancer Sister    Cancer Sister        lung   Heart attack Brother    Heart attack Brother    Breast cancer Other     Social History Social History   Tobacco Use   Smoking status: Former    Types: Cigarettes    Start date: 08/10/2021   Smokeless tobacco: Never   Tobacco comments:    smokes cig . less than a half pack a week. Patient has not smoked in a few weeks   Vaping Use   Vaping Use: Never used  Substance Use Topics   Alcohol use: No   Drug use: No     Allergies   Other, Sulfa antibiotics, Aspirin, Cisapride, Imipramine hcl, Lexapro [escitalopram], Mometasone, Penicillins, and Sulfonamide derivatives   Review of Systems Review of Systems  See HPI Physical Exam Triage Vital Signs ED Triage Vitals  Enc Vitals Group     BP 04/04/22 1428 (!) 152/79     Pulse Rate 04/04/22 1428 99     Resp 04/04/22 1428 20     Temp 04/04/22 1428 98.9 F (37.2 C)     Temp Source 04/04/22 1428 Oral     SpO2 04/04/22 1428 95 %     Weight 04/04/22 1431 126 lb (57.2 kg)     Height 04/04/22 1431 '5\' 3"'$  (1.6 m)      Head Circumference --      Peak Flow --      Pain Score 04/04/22 1430 1     Pain Loc --      Pain Edu? --      Excl. in [place]? --    No data found.  Updated Vital Signs BP (!) 152/79 (BP Location: Left Arm)   Pulse 99   Temp 98.9  F (37.2 C) (Oral)   Resp 20   Ht '5\' 3"'$  (1.6 m)   Wt 57.2 kg   SpO2 95%   BMI 22.32 kg/m      Physical Exam Constitutional:      General: She is not in acute distress.    Appearance: She is well-developed. She is ill-appearing.  HENT:     Head: Normocephalic and atraumatic.     Right Ear: Tympanic membrane, ear canal and external ear normal.     Left Ear: Ear canal and external ear normal.     Ears:     Comments: TM mildly injected    Nose: Congestion present. No rhinorrhea.     Mouth/Throat:     Pharynx: No posterior oropharyngeal erythema.  Eyes:     Conjunctiva/sclera: Conjunctivae normal.     Pupils: Pupils are equal, round, and reactive to light.  Cardiovascular:     Rate and Rhythm: Normal rate and regular rhythm.     Heart sounds: Normal heart sounds.  Pulmonary:     Effort: Pulmonary effort is normal. No respiratory distress.     Breath sounds: Rhonchi present.     Comments: Rhonchi right base.  Slight tachypnea Abdominal:     General: There is no distension.     Palpations: Abdomen is soft.  Musculoskeletal:        General: Normal range of motion.     Cervical back: Normal range of motion.  Lymphadenopathy:     Cervical: No cervical adenopathy.  Skin:    General: Skin is warm and dry.  Neurological:     Mental Status: She is alert.  Psychiatric:        Mood and Affect: Mood normal.        Behavior: Behavior normal.      UC Treatments / Results  Labs (all labs ordered are listed, but only abnormal results are displayed) Labs Reviewed  CBC WITH DIFFERENTIAL/PLATELET  COMPLETE METABOLIC PANEL WITH GFR    EKG   Radiology DG Chest 2 View  Result Date: 04/04/2022 CLINICAL DATA:  Cough.  Headache. EXAM: CHEST - 2  VIEW COMPARISON:  Radiograph 01/22/2022 FINDINGS: Heart is normal in size.The cardiomediastinal contours are stable, mild aortic tortuosity. Moderate aortic atherosclerosis. Pulmonary vasculature is normal. No consolidation, pleural effusion, or pneumothorax. No acute osseous abnormalities are seen. IMPRESSION: No acute chest findings. Electronically Signed   By: Keith Rake M.D.   On: 04/04/2022 15:14    Procedures Procedures (including critical care time)  Medications Ordered in UC Medications - No data to display  Initial Impression / Assessment and Plan / UC Course  I have reviewed the triage vital signs and the nursing notes.  Pertinent labs & imaging results that were available during my care of the patient were reviewed by me and considered in my medical decision making (see chart for details).     Daughter is concerned that patient really is not herself for the last week or so.  Also mentions her recent stroke and rehab. We will get lab work to make sure there is not another issue going on. She has yellow postnasal drip and coughing that is bothersome to her, will try a course of antibiotics.  Singulair for the asthma and shortness of breath.  Ibuprofen for headache.  Return as needed Final Clinical Impressions(s) / UC Diagnoses   Final diagnoses:  Bad headache  Malaise  Postnasal drip  Chronic cough     Discharge Instructions  Take the antibiotic 3 times a day Make sure you are drinking lots of water Running a humidifier  in your bedroom, if 1 is available Take Singulair once a day.  This helps with allergies and asthma Take ibuprofen for headache more than 1 or 2 a day.  Take with food Your lab results will be available in [place].  I will make sure Dr. Madilyn Fireman knows about your visit     ED Prescriptions     Medication Sig Dispense Auth. Provider   montelukast (SINGULAIR) 10 MG tablet Take 1 tablet (10 mg total) by mouth at bedtime. 30 tablet Raylene Everts, MD   cephALEXin (KEFLEX) 250 MG capsule Take 1 capsule (250 mg total) by mouth 3 (three) times daily. 21 capsule Raylene Everts, MD   ibuprofen (ADVIL) 600 MG tablet Take 1 tablet (600 mg total) by mouth every 6 (six) hours as needed. 30 tablet Raylene Everts, MD      PDMP not reviewed this encounter.   Raylene Everts, MD 04/04/22 [phone]

## 2022-04-04 NOTE — ED Notes (Signed)
2 attempts made to draw labs w/o success Warm packs applied to both hands  + flash back to left hand - no blood return - butterfly used, 2nd attempt to right hand - no flash back  Dr Meda Coffee notified - pt to follow up with PCP - does not need to return for labs

## 2022-04-04 NOTE — ED Triage Notes (Addendum)
Headache x 1 week BP was elevated this morning 170/94 after going to the bathroom, still elevated after 20 min  Here w/ daughter  Denies dizziness or SOB  Took tylenol this am  Chronic cough  Pain between shoulder blades since 1400 today

## 2022-04-04 NOTE — Discharge Instructions (Addendum)
Take the antibiotic 3 times a day Make sure you are drinking lots of water Running a humidifier  in your bedroom, if 1 is available Take Singulair once a day.  This helps with allergies and asthma Take ibuprofen for headache more than 1 or 2 a day.  Take with food Your lab results will be available in [place].  I will make sure Dr. Madilyn Fireman knows about your visit

## 2022-04-07 DIAGNOSIS — M48061 Spinal stenosis, lumbar region without neurogenic claudication: Secondary | ICD-10-CM | POA: Diagnosis not present

## 2022-04-07 DIAGNOSIS — R7301 Impaired fasting glucose: Secondary | ICD-10-CM | POA: Diagnosis not present

## 2022-04-07 DIAGNOSIS — H8113 Benign paroxysmal vertigo, bilateral: Secondary | ICD-10-CM | POA: Diagnosis not present

## 2022-04-07 DIAGNOSIS — E538 Deficiency of other specified B group vitamins: Secondary | ICD-10-CM | POA: Diagnosis not present

## 2022-04-07 DIAGNOSIS — I75023 Atheroembolism of bilateral lower extremities: Secondary | ICD-10-CM | POA: Diagnosis not present

## 2022-04-07 DIAGNOSIS — J449 Chronic obstructive pulmonary disease, unspecified: Secondary | ICD-10-CM | POA: Diagnosis not present

## 2022-04-07 DIAGNOSIS — Z8673 Personal history of transient ischemic attack (TIA), and cerebral infarction without residual deficits: Secondary | ICD-10-CM | POA: Diagnosis not present

## 2022-04-07 DIAGNOSIS — Z7902 Long term (current) use of antithrombotics/antiplatelets: Secondary | ICD-10-CM | POA: Diagnosis not present

## 2022-04-07 DIAGNOSIS — M545 Low back pain, unspecified: Secondary | ICD-10-CM | POA: Diagnosis not present

## 2022-04-07 DIAGNOSIS — M810 Age-related osteoporosis without current pathological fracture: Secondary | ICD-10-CM | POA: Diagnosis not present

## 2022-04-07 DIAGNOSIS — I129 Hypertensive chronic kidney disease with stage 1 through stage 4 chronic kidney disease, or unspecified chronic kidney disease: Secondary | ICD-10-CM | POA: Diagnosis not present

## 2022-04-07 DIAGNOSIS — J45909 Unspecified asthma, uncomplicated: Secondary | ICD-10-CM | POA: Diagnosis not present

## 2022-04-07 DIAGNOSIS — E785 Hyperlipidemia, unspecified: Secondary | ICD-10-CM | POA: Diagnosis not present

## 2022-04-07 DIAGNOSIS — K219 Gastro-esophageal reflux disease without esophagitis: Secondary | ICD-10-CM | POA: Diagnosis not present

## 2022-04-07 DIAGNOSIS — N1831 Chronic kidney disease, stage 3a: Secondary | ICD-10-CM | POA: Diagnosis not present

## 2022-04-07 DIAGNOSIS — M5136 Other intervertebral disc degeneration, lumbar region: Secondary | ICD-10-CM | POA: Diagnosis not present

## 2022-04-07 DIAGNOSIS — E559 Vitamin D deficiency, unspecified: Secondary | ICD-10-CM | POA: Diagnosis not present

## 2022-04-07 DIAGNOSIS — Z720 Tobacco use: Secondary | ICD-10-CM | POA: Diagnosis not present

## 2022-04-07 DIAGNOSIS — R42 Dizziness and giddiness: Secondary | ICD-10-CM | POA: Diagnosis not present

## 2022-04-14 DIAGNOSIS — Z8673 Personal history of transient ischemic attack (TIA), and cerebral infarction without residual deficits: Secondary | ICD-10-CM | POA: Diagnosis not present

## 2022-04-14 DIAGNOSIS — Z7902 Long term (current) use of antithrombotics/antiplatelets: Secondary | ICD-10-CM | POA: Diagnosis not present

## 2022-04-14 DIAGNOSIS — R7301 Impaired fasting glucose: Secondary | ICD-10-CM | POA: Diagnosis not present

## 2022-04-14 DIAGNOSIS — E559 Vitamin D deficiency, unspecified: Secondary | ICD-10-CM | POA: Diagnosis not present

## 2022-04-14 DIAGNOSIS — J449 Chronic obstructive pulmonary disease, unspecified: Secondary | ICD-10-CM | POA: Diagnosis not present

## 2022-04-14 DIAGNOSIS — M5136 Other intervertebral disc degeneration, lumbar region: Secondary | ICD-10-CM | POA: Diagnosis not present

## 2022-04-14 DIAGNOSIS — H8113 Benign paroxysmal vertigo, bilateral: Secondary | ICD-10-CM | POA: Diagnosis not present

## 2022-04-14 DIAGNOSIS — E785 Hyperlipidemia, unspecified: Secondary | ICD-10-CM | POA: Diagnosis not present

## 2022-04-14 DIAGNOSIS — R42 Dizziness and giddiness: Secondary | ICD-10-CM | POA: Diagnosis not present

## 2022-04-14 DIAGNOSIS — Z720 Tobacco use: Secondary | ICD-10-CM | POA: Diagnosis not present

## 2022-04-14 DIAGNOSIS — I129 Hypertensive chronic kidney disease with stage 1 through stage 4 chronic kidney disease, or unspecified chronic kidney disease: Secondary | ICD-10-CM | POA: Diagnosis not present

## 2022-04-14 DIAGNOSIS — M545 Low back pain, unspecified: Secondary | ICD-10-CM | POA: Diagnosis not present

## 2022-04-14 DIAGNOSIS — M48061 Spinal stenosis, lumbar region without neurogenic claudication: Secondary | ICD-10-CM | POA: Diagnosis not present

## 2022-04-14 DIAGNOSIS — I75023 Atheroembolism of bilateral lower extremities: Secondary | ICD-10-CM | POA: Diagnosis not present

## 2022-04-14 DIAGNOSIS — J45909 Unspecified asthma, uncomplicated: Secondary | ICD-10-CM | POA: Diagnosis not present

## 2022-04-14 DIAGNOSIS — M810 Age-related osteoporosis without current pathological fracture: Secondary | ICD-10-CM | POA: Diagnosis not present

## 2022-04-14 DIAGNOSIS — N1831 Chronic kidney disease, stage 3a: Secondary | ICD-10-CM | POA: Diagnosis not present

## 2022-04-14 DIAGNOSIS — E538 Deficiency of other specified B group vitamins: Secondary | ICD-10-CM | POA: Diagnosis not present

## 2022-04-14 DIAGNOSIS — K219 Gastro-esophageal reflux disease without esophagitis: Secondary | ICD-10-CM | POA: Diagnosis not present

## 2022-04-15 DIAGNOSIS — K219 Gastro-esophageal reflux disease without esophagitis: Secondary | ICD-10-CM | POA: Diagnosis not present

## 2022-04-15 DIAGNOSIS — H8113 Benign paroxysmal vertigo, bilateral: Secondary | ICD-10-CM | POA: Diagnosis not present

## 2022-04-15 DIAGNOSIS — Z7902 Long term (current) use of antithrombotics/antiplatelets: Secondary | ICD-10-CM | POA: Diagnosis not present

## 2022-04-15 DIAGNOSIS — R7301 Impaired fasting glucose: Secondary | ICD-10-CM | POA: Diagnosis not present

## 2022-04-15 DIAGNOSIS — N1831 Chronic kidney disease, stage 3a: Secondary | ICD-10-CM | POA: Diagnosis not present

## 2022-04-15 DIAGNOSIS — E538 Deficiency of other specified B group vitamins: Secondary | ICD-10-CM | POA: Diagnosis not present

## 2022-04-15 DIAGNOSIS — M810 Age-related osteoporosis without current pathological fracture: Secondary | ICD-10-CM | POA: Diagnosis not present

## 2022-04-15 DIAGNOSIS — M48061 Spinal stenosis, lumbar region without neurogenic claudication: Secondary | ICD-10-CM | POA: Diagnosis not present

## 2022-04-15 DIAGNOSIS — M5136 Other intervertebral disc degeneration, lumbar region: Secondary | ICD-10-CM | POA: Diagnosis not present

## 2022-04-15 DIAGNOSIS — R42 Dizziness and giddiness: Secondary | ICD-10-CM | POA: Diagnosis not present

## 2022-04-15 DIAGNOSIS — E559 Vitamin D deficiency, unspecified: Secondary | ICD-10-CM | POA: Diagnosis not present

## 2022-04-15 DIAGNOSIS — M545 Low back pain, unspecified: Secondary | ICD-10-CM | POA: Diagnosis not present

## 2022-04-15 DIAGNOSIS — I75023 Atheroembolism of bilateral lower extremities: Secondary | ICD-10-CM | POA: Diagnosis not present

## 2022-04-15 DIAGNOSIS — E785 Hyperlipidemia, unspecified: Secondary | ICD-10-CM | POA: Diagnosis not present

## 2022-04-15 DIAGNOSIS — Z8673 Personal history of transient ischemic attack (TIA), and cerebral infarction without residual deficits: Secondary | ICD-10-CM | POA: Diagnosis not present

## 2022-04-15 DIAGNOSIS — J449 Chronic obstructive pulmonary disease, unspecified: Secondary | ICD-10-CM | POA: Diagnosis not present

## 2022-04-15 DIAGNOSIS — J45909 Unspecified asthma, uncomplicated: Secondary | ICD-10-CM | POA: Diagnosis not present

## 2022-04-15 DIAGNOSIS — I129 Hypertensive chronic kidney disease with stage 1 through stage 4 chronic kidney disease, or unspecified chronic kidney disease: Secondary | ICD-10-CM | POA: Diagnosis not present

## 2022-04-15 DIAGNOSIS — Z720 Tobacco use: Secondary | ICD-10-CM | POA: Diagnosis not present

## 2022-04-16 ENCOUNTER — Other Ambulatory Visit: Payer: Self-pay | Admitting: Family Medicine

## 2022-04-16 DIAGNOSIS — J449 Chronic obstructive pulmonary disease, unspecified: Secondary | ICD-10-CM

## 2022-04-20 ENCOUNTER — Ambulatory Visit (INDEPENDENT_AMBULATORY_CARE_PROVIDER_SITE_OTHER): Payer: Medicare Other | Admitting: Family Medicine

## 2022-04-20 ENCOUNTER — Encounter: Payer: Self-pay | Admitting: Family Medicine

## 2022-04-20 VITALS — BP 134/76 | HR 78 | Wt 123.0 lb

## 2022-04-20 DIAGNOSIS — R7301 Impaired fasting glucose: Secondary | ICD-10-CM

## 2022-04-20 DIAGNOSIS — F4329 Adjustment disorder with other symptoms: Secondary | ICD-10-CM | POA: Diagnosis not present

## 2022-04-20 DIAGNOSIS — J441 Chronic obstructive pulmonary disease with (acute) exacerbation: Secondary | ICD-10-CM

## 2022-04-20 LAB — POCT GLYCOSYLATED HEMOGLOBIN (HGB A1C): Hemoglobin A1C: 5.7 % — AB (ref 4.0–5.6)

## 2022-04-20 MED ORDER — DULOXETINE HCL 30 MG PO CPEP: 30.0000 mg | ORAL_CAPSULE | Freq: Every day | ORAL | 3 refills | Status: DC

## 2022-04-20 MED ORDER — PREDNISONE 20 MG PO TABS: 40.0000 mg | ORAL_TABLET | Freq: Every day | ORAL | 0 refills | Status: DC

## 2022-04-20 NOTE — Assessment & Plan Note (Signed)
A1c looks great today.  Continue current regimen follow-up in 6 months.

## 2022-04-20 NOTE — Assessment & Plan Note (Signed)
Go ahead and refill her Cymbalta today.

## 2022-04-20 NOTE — Progress Notes (Signed)
Established Patient Office Visit  Subjective   Patient ID: Erika Lynch, female    DOB: 02-25-1933  Age: 88 y.o. MRN: [id number]  Chief Complaint  Patient presents with   Cough    HPI  She has a lingering cough.    Follow-up from urgent care-she was also seen on September 2 approximately 16 days ago for headache and not feeling well.  She had also had some increased shortness of breath at that time and postnasal drip and cough.  This x-ray was negative for any acute process.  She was given a prescription for Singulair, Keflex and ibuprofen.  Did complete her antibiotic about a week ago.  She says her cough is still productive.  No fever.  She feels like her sinuses that are completely better.  Also requesting refills on anything that might be due.  Impaired fasting glucose-no increased thirst or urination. No symptoms consistent with hypoglycemia  ROS    Objective:     BP 134/76   Pulse 78   Wt 123 lb (55.8 kg)   SpO2 97%   BMI 21.79 kg/m     Physical Exam Constitutional:      Appearance: She is well-developed.  HENT:     Head: Normocephalic and atraumatic.     Right Ear: External ear normal.     Left Ear: External ear normal.     Nose: Nose normal.  Eyes:     Conjunctiva/sclera: Conjunctivae normal.     Pupils: Pupils are equal, round, and reactive to light.  Neck:     Thyroid: No thyromegaly.  Cardiovascular:     Rate and Rhythm: Normal rate and regular rhythm.     Heart sounds: Normal heart sounds.  Pulmonary:     Effort: Pulmonary effort is normal.     Breath sounds: Normal breath sounds. No wheezing.     Comments: Diffuse coarse breath sounds with rhonchi at the left lower lobe rhonchi cleared after several deep breaths Musculoskeletal:     Cervical back: Neck supple.  Lymphadenopathy:     Cervical: No cervical adenopathy.  Skin:    General: Skin is warm and dry.  Neurological:     Mental Status: She is alert and oriented to person, place, and  time.      Results for orders placed or performed in visit on 04/20/22  POCT glycosylated hemoglobin (Hb A1C)  Result Value Ref Range   Hemoglobin A1C 5.7 (A) 4.0 - 5.6 %   HbA1c POC (<> result, manual entry)     HbA1c, POC (prediabetic range)     HbA1c, POC (controlled diabetic range)         The ASCVD Risk score (Arnett DK, et al., 2019) failed to calculate for the following reasons:   The 2019 ASCVD risk score is only valid for ages 30 to 24   The patient has a prior MI or stroke diagnosis    Assessment & Plan:   Problem List Items Addressed This Visit       Endocrine   IFG (impaired fasting glucose)    A1c looks great today.  Continue current regimen follow-up in 6 months.      Relevant Orders   POCT glycosylated hemoglobin (Hb A1C) (Completed)     Other   Stress and adjustment reaction    Go ahead and refill her Cymbalta today.      Relevant Medications   DULoxetine (CYMBALTA) 30 MG capsule   Other Visit Diagnoses  COPD exacerbation (HCC)    -  Primary   Relevant Medications   predniSONE (DELTASONE) 20 MG tablet      COPD exacerbation-we will send over prescription for prednisone.  If not better in 1 week then please let me know.  Return in about 4 months (around 08/20/2022) for bp.    Beatrice Lecher, MD

## 2022-06-08 ENCOUNTER — Ambulatory Visit
Admission: EM | Admit: 2022-06-08 | Discharge: 2022-06-08 | Disposition: A | Discharge status: Home / Self Care | Payer: Medicare Other | Attending: Family Medicine | Admitting: Family Medicine

## 2022-06-08 DIAGNOSIS — R0602 Shortness of breath: Secondary | ICD-10-CM | POA: Diagnosis not present

## 2022-06-08 DIAGNOSIS — J441 Chronic obstructive pulmonary disease with (acute) exacerbation: Secondary | ICD-10-CM

## 2022-06-08 MED ORDER — PREDNISONE 20 MG PO TABS: 40.0000 mg | ORAL_TABLET | Freq: Every day | ORAL | 0 refills | Status: DC

## 2022-06-08 NOTE — ED Provider Notes (Signed)
[name]    CSN: [id number] Arrival date & time: 06/08/22  1059      History   Chief Complaint Chief Complaint  Patient presents with   Asthma    HPI Erika Lynch is a 88 y.o. female.   HPI  Erika Lynch is back with an exacerbation of her COPD.  She has been more short of breath last Wednesday through today.  She is having some coughing.  She called her primary care doctor this morning and could not be seen so is here for evaluation.  Last time she felt like this she improved with steroids.  She has been using her Ventolin inhaler as needed as well as her Stiolto Respimat.  She is not on inhaled steroids  Past Medical History:  Diagnosis Date   Cataract    Dyskinesia of esophagus    Hypertension    Kidney stone    Stroke ([place]) 08/09/2021   Uterine cancer ([place])    Vertigo 07/16/2020    Patient Active Problem List   Diagnosis Date Noted   Dizziness 02/10/2022   Osteoporosis 01/28/2022   Stress and adjustment reaction 10/09/2021   Tobacco abuse 09/03/2021   Cerebrovascular accident (CVA) due to embolism of left middle cerebral artery ([place]) 08/13/2021   Vitamin D deficiency 10/25/2019   PVC (premature ventricular contraction) 10/23/2019   BPPV (benign paroxysmal positional vertigo) 08/14/2019   IFG (impaired fasting glucose) 08/08/2018   DDD (degenerative disc disease), cervical 07/04/2018   Urine frequency 04/06/2017   History of uterine cancer 04/06/2017   Microscopic hematuria 04/06/2017   History of esophageal stricture 12/17/2016   COPD with asthma 09/11/2016   Cervical disc disease 06/13/2012   Lumbar spinal stenosis 06/13/2012   BACK PAIN, LUMBAR 12/09/2009   PALPITATIONS 05/28/2008   Vitamin B12 deficiency 01/04/2008   GERD 12/29/2007   DYSKINESIA OF ESOPHAGUS 12/08/2007   CKD stage G3a/A1, GFR 45-59 and albumin creatinine ratio <30 mg/g ([place]) 10/28/2007   NECK PAIN 10/26/2007   CAROTID BRUIT, LEFT 10/26/2007   Hyperlipidemia  10/12/2007   Essential hypertension 10/12/2007    Past Surgical History:  Procedure Laterality Date   ABDOMINAL HYSTERECTOMY     Uterine cancer   BACK SURGERY     CATARACT EXTRACTION, BILATERAL      OB History   No obstetric history on file.      Home Medications    Prior to Admission medications   Medication Sig Start Date End Date Taking? Authorizing Provider  predniSONE (DELTASONE) 20 MG tablet Take 2 tablets (40 mg total) by mouth daily with breakfast. 06/08/22  Yes Raylene Everts, MD  acetaminophen (TYLENOL) 325 MG tablet Take by mouth. 08/15/21   [provider]  albuterol (VENTOLIN HFA) 108 (90 Base) MCG/ACT inhaler Inhale 2 puffs into the lungs every 6 (six) hours as needed for wheezing or shortness of breath. 04/16/22   Hali Marry, MD  alendronate (FOSAMAX) 70 MG tablet Take 1 tablet (70 mg total) by mouth every 7 (seven) days. Take with a full glass of water on an empty stomach. 01/28/22   Hali Marry, MD  atorvastatin (LIPITOR) 20 MG tablet Take 1 tablet (20 mg total) by mouth daily. 12/24/21   Hali Marry, MD  calcium carbonate (TUMS EX) 750 MG chewable tablet Chew 1 tablet by mouth daily.    [provider]  clopidogrel (PLAVIX) 75 MG tablet Take 1 tablet (75 mg total) by mouth daily. 10/09/21   [name],  Rene Kocher, MD  DULoxetine (CYMBALTA) 30 MG capsule Take 1 capsule (30 mg total) by mouth daily. 04/20/22   Hali Marry, MD  loratadine (CLARITIN) 10 MG tablet Take 1 tablet (10 mg total) by mouth daily. 12/08/21   Samuel Bouche, NP  meclizine (ANTIVERT) 25 MG tablet Take 1 tablet (25 mg total) by mouth 3 (three) times daily as needed for dizziness. 04/02/22   Hali Marry, MD  metoprolol succinate (TOPROL-XL) 50 MG 24 hr tablet Take 1 tablet (50 mg total) by mouth daily. 02/10/22   Hali Marry, MD  omeprazole (PRILOSEC) 40 MG capsule Take 40 mg by mouth daily. 02/12/22   [provider]   ondansetron (ZOFRAN-ODT) 4 MG disintegrating tablet Take 1 tablet (4 mg total) by mouth every 8 (eight) hours as needed for nausea or vomiting. 04/02/22   Hali Marry, MD  Tiotropium Bromide-Olodaterol (STIOLTO RESPIMAT) 2.5-2.5 MCG/ACT AERS Inhale 2 puffs into the lungs daily. 09/02/21   Hali Marry, MD    Family History Family History  Problem Relation Age of Onset   Heart attack Father    Stroke Father    Colon cancer Sister    Cancer Sister        lung   Heart attack Brother    Heart attack Brother    Breast cancer Other     Social History Social History   Tobacco Use   Smoking status: Former    Types: Cigarettes    Start date: 08/10/2021   Smokeless tobacco: Never   Tobacco comments:    smokes cig . less than a half pack a week. Patient has not smoked in a few weeks   Vaping Use   Vaping Use: Never used  Substance Use Topics   Alcohol use: No   Drug use: No     Allergies   Other, Sulfa antibiotics, Aspirin, Cisapride, Imipramine hcl, Lexapro [escitalopram], Mometasone, Penicillins, and Sulfonamide derivatives   Review of Systems Review of Systems See HPI  Physical Exam Triage Vital Signs ED Triage Vitals  Enc Vitals Group     BP 06/08/22 1112 (!) 155/85     Pulse Rate 06/08/22 1112 87     Resp 06/08/22 1112 16     Temp 06/08/22 1112 98.9 F (37.2 C)     Temp Source 06/08/22 1112 Oral     SpO2 06/08/22 1112 95 %     Weight --      Height --      Head Circumference --      Peak Flow --      Pain Score 06/08/22 1111 0     Pain Loc --      Pain Edu? --      Excl. in [place]? --    No data found.  Updated Vital Signs BP (!) 155/85 (BP Location: Right Arm)   Pulse 87   Temp 98.9 F (37.2 C) (Oral)   Resp 16   SpO2 95%      Physical Exam Constitutional:      General: She is not in acute distress.    Appearance: She is well-developed and normal weight.  HENT:     Head: Normocephalic and atraumatic.     Right Ear: Tympanic  membrane and ear canal normal.     Left Ear: Tympanic membrane and ear canal normal.     Nose: No congestion or rhinorrhea.     Mouth/Throat:     Mouth: Mucous membranes are  moist.     Pharynx: No posterior oropharyngeal erythema.  Eyes:     Conjunctiva/sclera: Conjunctivae normal.     Pupils: Pupils are equal, round, and reactive to light.  Cardiovascular:     Rate and Rhythm: Normal rate.  Pulmonary:     Effort: Pulmonary effort is normal. No respiratory distress.     Breath sounds: Wheezing present.  Abdominal:     General: There is no distension.     Palpations: Abdomen is soft.  Musculoskeletal:        General: Normal range of motion.     Cervical back: Normal range of motion.  Skin:    General: Skin is warm and dry.  Neurological:     Mental Status: She is alert.  Psychiatric:        Mood and Affect: Mood normal.        Behavior: Behavior normal.      UC Treatments / Results  Labs (all labs ordered are listed, but only abnormal results are displayed) Labs Reviewed - No data to display  EKG   Radiology No results found.  Procedures Procedures (including critical care time)  Medications Ordered in UC Medications - No data to display  Initial Impression / Assessment and Plan / UC Course  I have reviewed the triage vital signs and the nursing notes.  Pertinent labs & imaging results that were available during my care of the patient were reviewed by me and considered in my medical decision making (see chart for details).     Final Clinical Impressions(s) / UC Diagnoses   Final diagnoses:  COPD exacerbation (HCC)  Shortness of breath     Discharge Instructions      Take prednisone once a day for 5 days Make an appointment to see Dr. Madilyn Fireman next week   ED Prescriptions     Medication Sig Dispense Auth. Provider   predniSONE (DELTASONE) 20 MG tablet Take 2 tablets (40 mg total) by mouth daily with breakfast. 10 tablet Raylene Everts, MD       PDMP not reviewed this encounter.   Raylene Everts, MD 06/08/22 [phone]

## 2022-06-08 NOTE — Discharge Instructions (Signed)
Take prednisone once a day for 5 days Make an appointment to see Dr. Madilyn Fireman next week

## 2022-06-08 NOTE — ED Triage Notes (Signed)
Pt presents with c/o SOB that began last Wednesday.

## 2022-06-12 ENCOUNTER — Telehealth: Payer: Self-pay | Admitting: Family Medicine

## 2022-06-12 NOTE — Telephone Encounter (Signed)
I called pt this morning to reschedule appt for 08/20/2022. Spoke with daughter. She is concerned about Mom's asthma. She was seen in the E.D. on 11/06 and was prescribed prednisone. She said Mom does not seem to be responding to medication. Daughter is wondering what else can be done?

## 2022-06-12 NOTE — Telephone Encounter (Signed)
Is like she probably needs an acute slot if she is still having breathing issues.  I do not think we should wait till January.

## 2022-06-15 ENCOUNTER — Encounter: Payer: Self-pay | Admitting: Physician Assistant

## 2022-06-15 ENCOUNTER — Ambulatory Visit (INDEPENDENT_AMBULATORY_CARE_PROVIDER_SITE_OTHER): Payer: Medicare Other | Admitting: Physician Assistant

## 2022-06-15 VITALS — BP 143/83 | HR 100 | Ht 63.0 in | Wt 118.0 lb

## 2022-06-15 DIAGNOSIS — J441 Chronic obstructive pulmonary disease with (acute) exacerbation: Secondary | ICD-10-CM

## 2022-06-15 DIAGNOSIS — R0602 Shortness of breath: Secondary | ICD-10-CM

## 2022-06-15 DIAGNOSIS — J4541 Moderate persistent asthma with (acute) exacerbation: Secondary | ICD-10-CM

## 2022-06-15 MED ORDER — IPRATROPIUM-ALBUTEROL 0.5-2.5 (3) MG/3ML IN SOLN: 3.0000 mL | RESPIRATORY_TRACT | 0 refills | Status: DC | PRN

## 2022-06-15 MED ORDER — TRELEGY ELLIPTA 100-62.5-25 MCG/ACT IN AEPB: 1.0000 | INHALATION_SPRAY | Freq: Every day | RESPIRATORY_TRACT | 5 refills | Status: DC

## 2022-06-15 MED ORDER — IPRATROPIUM-ALBUTEROL 0.5-2.5 (3) MG/3ML IN SOLN
3.0000 mL | Freq: Once | RESPIRATORY_TRACT | Status: AC
  Administered 2022-06-15: 3 mL via RESPIRATORY_TRACT

## 2022-06-15 MED ORDER — PREDNISONE 20 MG PO TABS: ORAL_TABLET | ORAL | 0 refills | Status: DC

## 2022-06-15 MED ORDER — AZITHROMYCIN 250 MG PO TABS: ORAL_TABLET | ORAL | 0 refills | Status: DC

## 2022-06-15 NOTE — Progress Notes (Unsigned)
Acute Office Visit  Subjective:     Patient ID: Erika Lynch, female    DOB: [birth year], 88 y.o.   MRN: [id number]  Chief Complaint  Patient presents with   Cough    HPI Patient is in today for follow up on COPD. She went to UC on 06/08/2022. She was given prednisone for 5 days. She finished on Friday. She felt better while on prednisone but then started feeling worse again. She is on stilto and ventolin. She does not feel like either are helping right now.   .. Active Ambulatory Problems    Diagnosis Date Noted   Vitamin B12 deficiency 01/04/2008   Hyperlipidemia 10/12/2007   Essential hypertension 10/12/2007   DYSKINESIA OF ESOPHAGUS 12/08/2007   GERD 12/29/2007   CKD stage G3a/A1, GFR 45-59 and albumin creatinine ratio <30 mg/g (HCC) 10/28/2007   NECK PAIN 10/26/2007   BACK PAIN, LUMBAR 12/09/2009   PALPITATIONS 05/28/2008   CAROTID BRUIT, LEFT 10/26/2007   Cervical disc disease 06/13/2012   Lumbar spinal stenosis 06/13/2012   COPD with asthma 09/11/2016   History of esophageal stricture 12/17/2016   Urine frequency 04/06/2017   History of uterine cancer 04/06/2017   Microscopic hematuria 04/06/2017   DDD (degenerative disc disease), cervical 07/04/2018   IFG (impaired fasting glucose) 08/08/2018   BPPV (benign paroxysmal positional vertigo) 08/14/2019   PVC (premature ventricular contraction) 10/23/2019   Vitamin D deficiency 10/25/2019   Cerebrovascular accident (CVA) due to embolism of left middle cerebral artery ([place]) 08/13/2021   Tobacco abuse 09/03/2021   Stress and adjustment reaction 10/09/2021   Osteoporosis 01/28/2022   Dizziness 02/10/2022   SOB (shortness of breath) 06/15/2022   COPD exacerbation ([place]) 06/15/2022   Resolved Ambulatory Problems    Diagnosis Date Noted   Asthmatic bronchitis 11/23/2013   Dehydration 12/11/2015   Abdominal fullness 04/06/2017   Past Medical History:  Diagnosis Date   Cataract    Hypertension    Kidney stone     Stroke ([place]) 08/09/2021   Uterine cancer ([place])    Vertigo 07/16/2020     ROS See HPI.      Objective:    BP (!) 143/83   Pulse 100   Ht '5\' 3"'$  (1.6 m)   Wt 118 lb (53.5 kg)   SpO2 99%   BMI 20.90 kg/m  BP Readings from Last 3 Encounters:  06/15/22 (!) 143/83  06/08/22 (!) 155/85  04/20/22 134/76   Wt Readings from Last 3 Encounters:  06/15/22 118 lb (53.5 kg)  04/20/22 123 lb (55.8 kg)  04/04/22 126 lb (57.2 kg)    Duoneb given today in office.   Physical Exam Constitutional:      Appearance: She is ill-appearing.  HENT:     Head: Normocephalic.  Neck:     Vascular: No carotid bruit.  Cardiovascular:     Rate and Rhythm: Regular rhythm. Tachycardia present.  Pulmonary:     Breath sounds: Wheezing present.     Comments: Labored breathing Musculoskeletal:     Right lower leg: No edema.     Left lower leg: No edema.  Lymphadenopathy:     Cervical: No cervical adenopathy.  Neurological:     Mental Status: She is alert and oriented to person, place, and time.  Psychiatric:        Mood and Affect: Mood normal.          Assessment & Plan:  Marland KitchenMarland KitchenHattye was seen today for cough.  Diagnoses and all  orders for this visit:  COPD exacerbation ([place]) -     ipratropium-albuterol (DUONEB) 0.5-2.5 (3) MG/3ML nebulizer solution 3 mL  SOB (shortness of breath)  Other orders -     Fluticasone-Umeclidin-Vilant (TRELEGY ELLIPTA) 100-62.5-25 MCG/ACT AEPB; Inhale 1 puff into the lungs daily.     Iran Planas, PA-C

## 2022-06-15 NOTE — Patient Instructions (Addendum)
Stop Stilito inhaler. Start Trelegy 1 puff daily.  Use ventolin as needed or duoneb nebulizer.  Start Zpak and another round of steroids

## 2022-06-15 NOTE — Telephone Encounter (Signed)
Patient scheduled with Iran Planas for 4:20 pm  today.

## 2022-06-16 ENCOUNTER — Telehealth: Payer: Self-pay | Admitting: Neurology

## 2022-06-16 ENCOUNTER — Encounter: Payer: Self-pay | Admitting: Physician Assistant

## 2022-06-16 DIAGNOSIS — J4541 Moderate persistent asthma with (acute) exacerbation: Secondary | ICD-10-CM

## 2022-06-16 MED ORDER — TRELEGY ELLIPTA 100-62.5-25 MCG/ACT IN AEPB: 1.0000 | INHALATION_SPRAY | Freq: Every day | RESPIRATORY_TRACT | 2 refills | Status: DC

## 2022-06-16 NOTE — Telephone Encounter (Signed)
[facility] called and LVM stating they can only get 60 Quantity of Trelegy and not 28. Resent RX with updated Quantity.

## 2022-08-01 ENCOUNTER — Other Ambulatory Visit: Payer: Self-pay | Admitting: Family Medicine

## 2022-08-01 DIAGNOSIS — I10 Essential (primary) hypertension: Secondary | ICD-10-CM

## 2022-08-20 ENCOUNTER — Encounter: Payer: Self-pay | Admitting: Family Medicine

## 2022-08-20 ENCOUNTER — Ambulatory Visit (INDEPENDENT_AMBULATORY_CARE_PROVIDER_SITE_OTHER): Payer: 59 | Admitting: Family Medicine

## 2022-08-20 VITALS — BP 150/87 | HR 79 | Ht 63.0 in | Wt 115.0 lb

## 2022-08-20 DIAGNOSIS — R7301 Impaired fasting glucose: Secondary | ICD-10-CM

## 2022-08-20 DIAGNOSIS — H8110 Benign paroxysmal vertigo, unspecified ear: Secondary | ICD-10-CM | POA: Diagnosis not present

## 2022-08-20 DIAGNOSIS — N1831 Chronic kidney disease, stage 3a: Secondary | ICD-10-CM | POA: Diagnosis not present

## 2022-08-20 DIAGNOSIS — J4489 Other specified chronic obstructive pulmonary disease: Secondary | ICD-10-CM | POA: Diagnosis not present

## 2022-08-20 DIAGNOSIS — J4541 Moderate persistent asthma with (acute) exacerbation: Secondary | ICD-10-CM

## 2022-08-20 DIAGNOSIS — I10 Essential (primary) hypertension: Secondary | ICD-10-CM

## 2022-08-20 LAB — POCT GLYCOSYLATED HEMOGLOBIN (HGB A1C): Hemoglobin A1C: 6.3 % — AB (ref 4.0–5.6)

## 2022-08-20 MED ORDER — TRELEGY ELLIPTA 100-62.5-25 MCG/ACT IN AEPB: 1.0000 | INHALATION_SPRAY | Freq: Every day | RESPIRATORY_TRACT | 12 refills | Status: DC

## 2022-08-20 MED ORDER — ALBUTEROL SULFATE (2.5 MG/3ML) 0.083% IN NEBU: 2.5000 mg | INHALATION_SOLUTION | Freq: Four times a day (QID) | RESPIRATORY_TRACT | 12 refills | Status: DC | PRN

## 2022-08-20 NOTE — Progress Notes (Signed)
Established Patient Office Visit  Subjective   Patient ID: Erika Lynch, female    DOB: March 05, 1933  Age: 89 y.o. MRN: [id number]  Chief Complaint  Patient presents with   Hypertension    HPI  Hypertension- Pt denies chest pain, SOB, dizziness, or heart palpitations.  Taking meds as directed w/o problems.  Denies medication side effects.    She just says she wakes up in the morning with a lot of slimy mucus in her mouth and she has to go clean her mouth or teeth before she can even eat or do anything in the morning.  She does run a cool-mist humidifier in the bedroom and in the main living room to try to help.  COPD-she is like to switch back to the Trelegy and discontinue the Stiolto and DuoNebs.  She says the nebulizer treatments were starting to make her nauseated.  She says her vertigo is back it started again yesterday.  She would like to get the physical therapist that worked with her previously for her vertigo back.  He is through [employer].    Impaired fasting glucose-no increased thirst or urination. No symptoms consistent with hypoglycemia.     ROS    Objective:     BP (!) 150/87   Pulse 79   Ht '5\' 3"'$  (1.6 m)   Wt 115 lb (52.2 kg)   SpO2 100%   BMI 20.37 kg/m    Physical Exam Vitals and nursing note reviewed.  Constitutional:      Appearance: She is well-developed.  HENT:     Head: Normocephalic and atraumatic.     Right Ear: Tympanic membrane, ear canal and external ear normal.     Left Ear: Tympanic membrane, ear canal and external ear normal.  Cardiovascular:     Rate and Rhythm: Normal rate and regular rhythm.     Heart sounds: Normal heart sounds.  Pulmonary:     Effort: Pulmonary effort is normal.     Breath sounds: Normal breath sounds.  Skin:    General: Skin is warm and dry.  Neurological:     Mental Status: She is alert and oriented to person, place, and time.  Psychiatric:        Mood and Affect: Mood normal.        Behavior:  Behavior normal.      Results for orders placed or performed in visit on 89/38/10  BASIC METABOLIC PANEL WITH GFR  Result Value Ref Range   Glucose, Bld 78 65 - 99 mg/dL   BUN 9 7 - 25 mg/dL   Creat 1.09 (H) 0.60 - 0.95 mg/dL   eGFR 49 (L) > OR = 60 mL/min/1.86m   BUN/Creatinine Ratio 8 6 - 22 (calc)   Sodium 140 135 - 146 mmol/L   Potassium 4.5 3.5 - 5.3 mmol/L   Chloride 105 98 - 110 mmol/L   CO2 27 20 - 32 mmol/L   Calcium 9.4 8.6 - 10.4 mg/dL  Lipid Panel w/reflex Direct LDL  Result Value Ref Range   Cholesterol 126 <200 mg/dL   HDL 44 (L) > OR = 50 mg/dL   Triglycerides 115 <150 mg/dL   LDL Cholesterol (Calc) 62 mg/dL (calc)   Total CHOL/HDL Ratio 2.9 <5.0 (calc)   Non-HDL Cholesterol (Calc) 82 <130 mg/dL (calc)  POCT glycosylated hemoglobin (Hb A1C)  Result Value Ref Range   Hemoglobin A1C 6.3 (A) 4.0 - 5.6 %   HbA1c POC (<> result, manual entry)  HbA1c, POC (prediabetic range)     HbA1c, POC (controlled diabetic range)        The ASCVD Risk score (Arnett DK, et al., 2019) failed to calculate for the following reasons:   The 2019 ASCVD risk score is only valid for ages 67 to 46   The patient has a prior MI or stroke diagnosis    Assessment & Plan:   Problem List Items Addressed This Visit       Cardiovascular and Mediastinum   Essential hypertension - Primary    Initial blood pressure elevated today.  Will plan to recheck.  Repeat blood pressure still quite elevated so we will start 25 mg of losartan.  Will need to recheck BMP after new start.      Relevant Medications   losartan (COZAAR) 25 MG tablet   Other Relevant Orders   BASIC METABOLIC PANEL WITH GFR (Completed)   Lipid Panel w/reflex Direct LDL (Completed)     Respiratory   Moderate persistent asthma with acute exacerbation   Relevant Medications   Fluticasone-Umeclidin-Vilant (TRELEGY ELLIPTA) 100-62.5-25 MCG/ACT AEPB   albuterol (PROVENTIL) (2.5 MG/3ML) 0.083% nebulizer solution   COPD  with asthma    Will discontinue Stiolto and switch back to Trelegy.  Can use albuterol nebulizer as needed for acute flares.      Relevant Medications   Fluticasone-Umeclidin-Vilant (TRELEGY ELLIPTA) 100-62.5-25 MCG/ACT AEPB   albuterol (PROVENTIL) (2.5 MG/3ML) 0.083% nebulizer solution     Endocrine   IFG (impaired fasting glucose)    A1C up to 6.3 after the holiday. Plan to recheck A1C in 6 months.        Relevant Orders   POCT glycosylated hemoglobin (Hb A1C) (Completed)   BASIC METABOLIC PANEL WITH GFR (Completed)   Lipid Panel w/reflex Direct LDL (Completed)     Nervous and Auditory   BPPV (benign paroxysmal positional vertigo)    Unfortunately her vertigo is back.  Will go ahead and place referral.      Relevant Orders   Ambulatory referral to Home Health     Genitourinary   CKD stage G3a/A1, GFR 45-59 and albumin creatinine ratio <30 mg/g (HCC)    Continue to follow renal function every 6 months.       Return in about 6 months (around 02/18/2023) for Hypertension and prediabetes and COPD.    Beatrice Lecher, MD

## 2022-08-20 NOTE — Assessment & Plan Note (Signed)
Unfortunately her vertigo is back.  Will go ahead and place referral.

## 2022-08-20 NOTE — Assessment & Plan Note (Signed)
Will discontinue Stiolto and switch back to Trelegy.  Can use albuterol nebulizer as needed for acute flares.

## 2022-08-20 NOTE — Assessment & Plan Note (Addendum)
Initial blood pressure elevated today.  Will plan to recheck.  Repeat blood pressure still quite elevated so we will start 25 mg of losartan.  Will need to recheck BMP after new start.

## 2022-08-20 NOTE — Assessment & Plan Note (Signed)
Continue to follow renal function every 6 months.

## 2022-08-20 NOTE — Assessment & Plan Note (Signed)
A1C up to 6.3 after the holiday. Plan to recheck A1C in 6 months.

## 2022-08-21 ENCOUNTER — Telehealth: Payer: Self-pay | Admitting: Family Medicine

## 2022-08-21 DIAGNOSIS — I10 Essential (primary) hypertension: Secondary | ICD-10-CM

## 2022-08-21 LAB — LIPID PANEL W/REFLEX DIRECT LDL
Cholesterol: 126 mg/dL (ref <200)
HDL: 44 mg/dL — ABNORMAL LOW (ref >=50)
LDL Cholesterol (Calc): 62 mg/dL (calc)
Non-HDL Cholesterol (Calc): 82 mg/dL (calc) (ref <130)
Total CHOL/HDL Ratio: 2.9 (calc) (ref <5.0)
Triglycerides: 115 mg/dL (ref <150)

## 2022-08-21 LAB — BASIC METABOLIC PANEL WITH GFR
BUN/Creatinine Ratio: 8 (calc) (ref 6–22)
BUN: 9 mg/dL (ref 7–25)
CO2: 27 mmol/L (ref 20–32)
Calcium: 9.4 mg/dL (ref 8.6–10.4)
Chloride: 105 mmol/L (ref 98–110)
Creat: 1.09 mg/dL — ABNORMAL HIGH (ref 0.60–0.95)
Glucose, Bld: 78 mg/dL (ref 65–99)
Potassium: 4.5 mmol/L (ref 3.5–5.3)
Sodium: 140 mmol/L (ref 135–146)
eGFR: 49 mL/min/{1.73_m2} — ABNORMAL LOW (ref >=60)

## 2022-08-21 MED ORDER — LOSARTAN POTASSIUM 25 MG PO TABS: 25.0000 mg | ORAL_TABLET | Freq: Every day | ORAL | 1 refills | Status: DC

## 2022-08-21 NOTE — Telephone Encounter (Signed)
Please call patient and her daughter and let them know that I am going to send over a low-dose blood pressure pill for her to start.  In looking back her blood pressure has been elevated every time that she comes for quite a while periods were just can to put her on a really low dose that should only lower her blood pressure by 5-10 points at the most that should hopefully get her blood pressure under 140 pretty consistently.  Will need to repeat a BMP in 2 to 3 weeks after she starts the new pill.

## 2022-08-21 NOTE — Telephone Encounter (Signed)
Patient's daughter advised. Ordered labs.

## 2022-08-21 NOTE — Progress Notes (Signed)
Hi Erika Lynch, kidney function is stable.  In fact it actually looked a little better this time.  Your cholesterol looks absolutely fantastic!  The cholesterol pill is really working well!

## 2022-09-08 ENCOUNTER — Ambulatory Visit (INDEPENDENT_AMBULATORY_CARE_PROVIDER_SITE_OTHER): Payer: 59 | Admitting: Physician Assistant

## 2022-09-08 ENCOUNTER — Encounter: Payer: Self-pay | Admitting: Physician Assistant

## 2022-09-08 ENCOUNTER — Ambulatory Visit (INDEPENDENT_AMBULATORY_CARE_PROVIDER_SITE_OTHER): Payer: 59

## 2022-09-08 VITALS — BP 144/84 | HR 94 | Ht 63.0 in | Wt 114.0 lb

## 2022-09-08 DIAGNOSIS — R82998 Other abnormal findings in urine: Secondary | ICD-10-CM | POA: Diagnosis not present

## 2022-09-08 DIAGNOSIS — R410 Disorientation, unspecified: Secondary | ICD-10-CM | POA: Diagnosis not present

## 2022-09-08 DIAGNOSIS — R519 Headache, unspecified: Secondary | ICD-10-CM

## 2022-09-08 DIAGNOSIS — R531 Weakness: Secondary | ICD-10-CM

## 2022-09-08 LAB — POCT URINALYSIS DIP (CLINITEK)
Bilirubin, UA: NEGATIVE
Blood, UA: NEGATIVE
Glucose, UA: NEGATIVE mg/dL
Ketones, POC UA: NEGATIVE mg/dL
Nitrite, UA: NEGATIVE
POC PROTEIN,UA: NEGATIVE
Spec Grav, UA: 1.025 (ref 1.010–1.025)
Urobilinogen, UA: 0.2 E.U./dL
pH, UA: 6 (ref 5.0–8.0)

## 2022-09-08 MED ORDER — CEPHALEXIN 500 MG PO CAPS: 500.0000 mg | ORAL_CAPSULE | Freq: Two times a day (BID) | ORAL | 0 refills | Status: DC

## 2022-09-08 NOTE — Patient Instructions (Addendum)
Start keflex Get labs Get CXR  Push fluids

## 2022-09-08 NOTE — Progress Notes (Signed)
No acute abnormalities on CXR or signs of infection.

## 2022-09-08 NOTE — Progress Notes (Signed)
Acute Office Visit  Subjective:     Patient ID: Erika Lynch, female    DOB: August 20, 1932, 89 y.o.   MRN: [id number]  Chief Complaint  Patient presents with   Altered Mental Status    HPI Patient is in today for dizzines, confusion, chills that started Friday. Her daughter accompanies her today. She was seen recently for HTN and Dr. Madilyn Fireman did start her on losartan. She has been taking it. She denies any URI symptoms. She denies any fever, chills, body aches. She denies any abdominal pain or urinary symptoms. No bowel changes. She just suddenly cannot remember things and feels confused. She does have a dull headache and feels dizzy in head. Hx of vertigo but does not feel like that. She has had a recent COPD exacerbation but feels better. She is weak but continues to walk without assistance. She denies any lower leg swelling or SOB.   Marland Kitchen. Active Ambulatory Problems    Diagnosis Date Noted   Vitamin B12 deficiency 01/04/2008   Hyperlipidemia 10/12/2007   Essential hypertension 10/12/2007   DYSKINESIA OF ESOPHAGUS 12/08/2007   GERD 12/29/2007   CKD stage G3a/A1, GFR 45-59 and albumin creatinine ratio <30 mg/g (HCC) 10/28/2007   NECK PAIN 10/26/2007   BACK PAIN, LUMBAR 12/09/2009   PALPITATIONS 05/28/2008   CAROTID BRUIT, LEFT 10/26/2007   Cervical disc disease 06/13/2012   Lumbar spinal stenosis 06/13/2012   COPD with asthma 09/11/2016   History of esophageal stricture 12/17/2016   Urine frequency 04/06/2017   History of uterine cancer 04/06/2017   Microscopic hematuria 04/06/2017   DDD (degenerative disc disease), cervical 07/04/2018   IFG (impaired fasting glucose) 08/08/2018   BPPV (benign paroxysmal positional vertigo) 08/14/2019   PVC (premature ventricular contraction) 10/23/2019   Vitamin D deficiency 10/25/2019   Cerebrovascular accident (CVA) due to embolism of left middle cerebral artery ([place]) 08/13/2021   Tobacco abuse 09/03/2021   Stress and adjustment reaction  10/09/2021   Osteoporosis 01/28/2022   Dizziness 02/10/2022   SOB (shortness of breath) 06/15/2022   COPD exacerbation ([place]) 06/15/2022   Moderate persistent asthma with acute exacerbation 06/15/2022   Resolved Ambulatory Problems    Diagnosis Date Noted   Asthmatic bronchitis 11/23/2013   Dehydration 12/11/2015   Abdominal fullness 04/06/2017   Past Medical History:  Diagnosis Date   Cataract    Hypertension    Kidney stone    Stroke ([place]) 08/09/2021   Uterine cancer ([place])    Vertigo 07/16/2020     ROS  See hpi.     Objective:    BP (!) 144/84   Pulse 94   Ht 5' 3"$  (1.6 m)   Wt 114 lb (51.7 kg)   SpO2 94%   BMI 20.19 kg/m  BP Readings from Last 3 Encounters:  09/08/22 (!) 144/84  08/20/22 (!) 150/87  06/15/22 (!) 143/83   Wt Readings from Last 3 Encounters:  09/08/22 114 lb (51.7 kg)  08/20/22 115 lb (52.2 kg)  06/15/22 118 lb (53.5 kg)      Physical Exam Constitutional:      Appearance: Normal appearance.  HENT:     Head: Normocephalic.     Right Ear: Tympanic membrane, ear canal and external ear normal. There is no impacted cerumen.     Left Ear: Tympanic membrane, ear canal and external ear normal. There is no impacted cerumen.     Nose: Nose normal.     Mouth/Throat:     Mouth: Mucous membranes are  moist.     Pharynx: No oropharyngeal exudate or posterior oropharyngeal erythema.  Eyes:     Conjunctiva/sclera: Conjunctivae normal.  Cardiovascular:     Rate and Rhythm: Normal rate and regular rhythm.  Pulmonary:     Effort: Pulmonary effort is normal.     Breath sounds: Normal breath sounds.  Abdominal:     General: There is no distension.     Palpations: Abdomen is soft. There is no mass.     Tenderness: There is no abdominal tenderness. There is no right CVA tenderness, left CVA tenderness, guarding or rebound.     Hernia: No hernia is present.  Musculoskeletal:     Cervical back: No tenderness.     Right lower leg: No edema.     Left  lower leg: No edema.  Lymphadenopathy:     Cervical: No cervical adenopathy.  Neurological:     General: No focal deficit present.     Mental Status: She is alert and oriented to person, place, and time.  Psychiatric:        Mood and Affect: Mood normal.            Assessment & Plan:  Marland KitchenMarland KitchenCamarie was seen today for altered mental status.  Diagnoses and all orders for this visit:  Confusion -     DG Chest 2 View; Future -     COMPLETE METABOLIC PANEL WITH GFR -     CBC w/Diff/Platelet -     POCT URINALYSIS DIP (CLINITEK) -     Urine Culture  Weakness -     DG Chest 2 View; Future -     COMPLETE METABOLIC PANEL WITH GFR -     CBC w/Diff/Platelet  Nonintractable headache, unspecified chronicity pattern, unspecified headache type -     COMPLETE METABOLIC PANEL WITH GFR -     CBC w/Diff/Platelet  Leukocytes in urine -     cephALEXin (KEFLEX) 500 MG capsule; Take 1 capsule (500 mg total) by mouth 2 (two) times daily. -     Urine Culture   Unclear etiology of symptoms Will get labs to look at electrolytes and WBC to look for infection UA had small leukocytes in urine will treat empirically for UTI with keflex Will culture to confirm infection CXR to rule out any infection in lungs Keep 1 week follow up with PCP to make sure patient is improving On PE today no acute worrisome findings    Iran Planas, PA-C

## 2022-09-09 LAB — COMPLETE METABOLIC PANEL WITH GFR
AG Ratio: 2.1 (calc) (ref 1.0–2.5)
ALT: 33 U/L — ABNORMAL HIGH (ref 6–29)
AST: 31 U/L (ref 10–35)
Albumin: 4.5 g/dL (ref 3.6–5.1)
Alkaline phosphatase (APISO): 34 U/L — ABNORMAL LOW (ref 37–153)
BUN/Creatinine Ratio: 23 (calc) — ABNORMAL HIGH (ref 6–22)
BUN: 23 mg/dL (ref 7–25)
CO2: 30 mmol/L (ref 20–32)
Calcium: 10.3 mg/dL (ref 8.6–10.4)
Chloride: 102 mmol/L (ref 98–110)
Creat: 0.99 mg/dL — ABNORMAL HIGH (ref 0.60–0.95)
Globulin: 2.1 g/dL (calc) (ref 1.9–3.7)
Glucose, Bld: 87 mg/dL (ref 65–99)
Potassium: 5 mmol/L (ref 3.5–5.3)
Sodium: 139 mmol/L (ref 135–146)
Total Bilirubin: 0.4 mg/dL (ref 0.2–1.2)
Total Protein: 6.6 g/dL (ref 6.1–8.1)
eGFR: 55 mL/min/{1.73_m2} — ABNORMAL LOW (ref >=60)

## 2022-09-09 LAB — CBC WITH DIFFERENTIAL/PLATELET
Absolute Monocytes: 680 cells/uL (ref 200–950)
Basophils Absolute: 155 cells/uL (ref 0–200)
Basophils Relative: 1.5 %
Eosinophils Absolute: 1349 cells/uL — ABNORMAL HIGH (ref 15–500)
Eosinophils Relative: 13.1 %
HCT: 41.4 % (ref 35.0–45.0)
Hemoglobin: 14 g/dL (ref 11.7–15.5)
Lymphs Abs: 4202 cells/uL — ABNORMAL HIGH (ref 850–3900)
MCH: 32.3 pg (ref 27.0–33.0)
MCHC: 33.8 g/dL (ref 32.0–36.0)
MCV: 95.6 fL (ref 80.0–100.0)
MPV: 11.1 fL (ref 7.5–12.5)
Monocytes Relative: 6.6 %
Neutro Abs: 3914 cells/uL (ref 1500–7800)
Neutrophils Relative %: 38 %
Platelets: 282 10*3/uL (ref 140–400)
RBC: 4.33 10*6/uL (ref 3.80–5.10)
RDW: 12.7 % (ref 11.0–15.0)
Total Lymphocyte: 40.8 %
WBC: 10.3 10*3/uL (ref 3.8–10.8)

## 2022-09-09 LAB — URINE CULTURE
MICRO NUMBER:: 14525835
SPECIMEN QUALITY:: ADEQUATE

## 2022-09-09 NOTE — Progress Notes (Signed)
Milica,   Kidney function improved some which is great news.  ALT is up from previous checks. WBC normal range but upper limits but likely due to eosinophils being elevated which is usually more allergic. Do you feel like your allergies are bad right now?  Recheck with PcP in 1 week.  Waiting on urine culture.

## 2022-09-11 ENCOUNTER — Telehealth: Payer: Self-pay

## 2022-09-11 DIAGNOSIS — R82998 Other abnormal findings in urine: Secondary | ICD-10-CM | POA: Insufficient documentation

## 2022-09-11 DIAGNOSIS — R519 Headache, unspecified: Secondary | ICD-10-CM | POA: Insufficient documentation

## 2022-09-11 DIAGNOSIS — R410 Disorientation, unspecified: Secondary | ICD-10-CM | POA: Insufficient documentation

## 2022-09-11 DIAGNOSIS — R531 Weakness: Secondary | ICD-10-CM | POA: Insufficient documentation

## 2022-09-11 NOTE — Progress Notes (Signed)
Very low colony count of any bacteria and at a level where we do not usually treat with antibiotic. Are you feeling better?

## 2022-09-11 NOTE — Telephone Encounter (Signed)
Can we call the home health and see what the hold up is?

## 2022-09-11 NOTE — Telephone Encounter (Signed)
Sondra Barges daughter, states they are still waiting on [facility]. The referral was sent in January.

## 2022-09-15 NOTE — Telephone Encounter (Signed)
Contacted lauren at [facility] to check the status of Home health referral. Per Ander Purpura, notes say non admit. Lauren will research status and contact the office back with more information.

## 2022-09-18 ENCOUNTER — Other Ambulatory Visit: Payer: Self-pay | Admitting: Family Medicine

## 2022-09-18 DIAGNOSIS — I63412 Cerebral infarction due to embolism of left middle cerebral artery: Secondary | ICD-10-CM

## 2022-09-21 NOTE — Telephone Encounter (Signed)
[facility] referral, clinical notes and copies of insurance cards to [facility] at [phone].

## 2022-09-22 NOTE — Telephone Encounter (Signed)
Ronalee Belts from [facility] is unable to accept referral due to insurance and recommended [facility].

## 2022-09-22 NOTE — Telephone Encounter (Signed)
Faxed Home health referral, clinical notes and copies of insurance cards to [facility] at [phone].

## 2022-09-23 ENCOUNTER — Telehealth: Payer: Self-pay | Admitting: *Deleted

## 2022-09-23 NOTE — Telephone Encounter (Signed)
Spoke w/Amy she wanted to know which side Erika Lynch's BPPV is on. I informed her that is Bilateral.

## 2022-09-24 DIAGNOSIS — Z8673 Personal history of transient ischemic attack (TIA), and cerebral infarction without residual deficits: Secondary | ICD-10-CM | POA: Diagnosis not present

## 2022-09-24 DIAGNOSIS — M7989 Other specified soft tissue disorders: Secondary | ICD-10-CM | POA: Diagnosis not present

## 2022-09-28 ENCOUNTER — Encounter: Payer: Self-pay | Admitting: Family Medicine

## 2022-09-28 ENCOUNTER — Telehealth: Payer: Self-pay | Admitting: Family Medicine

## 2022-09-28 ENCOUNTER — Ambulatory Visit (INDEPENDENT_AMBULATORY_CARE_PROVIDER_SITE_OTHER): Payer: 59 | Admitting: Family Medicine

## 2022-09-28 VITALS — BP 147/67 | HR 73 | Ht 63.0 in | Wt 114.0 lb

## 2022-09-28 DIAGNOSIS — J4489 Other specified chronic obstructive pulmonary disease: Secondary | ICD-10-CM | POA: Diagnosis not present

## 2022-09-28 DIAGNOSIS — R42 Dizziness and giddiness: Secondary | ICD-10-CM

## 2022-09-28 DIAGNOSIS — M7989 Other specified soft tissue disorders: Secondary | ICD-10-CM

## 2022-09-28 DIAGNOSIS — K2100 Gastro-esophageal reflux disease with esophagitis, without bleeding: Secondary | ICD-10-CM

## 2022-09-28 DIAGNOSIS — I10 Essential (primary) hypertension: Secondary | ICD-10-CM

## 2022-09-28 DIAGNOSIS — H8110 Benign paroxysmal vertigo, unspecified ear: Secondary | ICD-10-CM

## 2022-09-28 MED ORDER — PANTOPRAZOLE SODIUM 40 MG PO TBEC: 40.0000 mg | DELAYED_RELEASE_TABLET | Freq: Every day | ORAL | 1 refills | Status: DC

## 2022-09-28 MED ORDER — MECLIZINE HCL 25 MG PO TABS: 25.0000 mg | ORAL_TABLET | Freq: Three times a day (TID) | ORAL | 1 refills | Status: DC | PRN

## 2022-09-28 NOTE — Telephone Encounter (Signed)
Please call patient or her daughter and let them know that I did switch the omeprazole to pantoprazole.  The omeprazole interacts with her blood thinner.  I am not sure who last wrote the omeprazole it does not say on her list just as historical.

## 2022-09-28 NOTE — Assessment & Plan Note (Signed)
Stable.  Chest sounds great today.

## 2022-09-28 NOTE — Assessment & Plan Note (Signed)
ET will come out and start on Wednesday she would like a refill on the meclizine today.

## 2022-09-28 NOTE — Telephone Encounter (Signed)
Called and LVM on pt's daughter's VM about medication change.

## 2022-09-28 NOTE — Progress Notes (Signed)
Established Patient Office Visit  Subjective   Patient ID: Erika Lynch, female    DOB: [birth year]  Age: 89 y.o. MRN: [id number]  Chief Complaint  Patient presents with   Follow-up    She reports that she still feels dizzy. PT is coming out later this week  Pt has some swelling in L foot she reports that her cardiologist has ordered a scan to have this checked for possible DVT.      HPI   She reports that she still feels dizzy. PT is coming out later this week Pt has some swelling in L foot she reports that her cardiologist has ordered a scan to have this checked for possible DVT.  She had limited tenderness with palpitation on the fourth and fifth distal metatarsal heads but says has no pain when she walks no pain in the calf or the leg.  She said she did not even notice it was swollen initially.  She still having vertigo and feels like this is the biggest issue that is affecting her quality of life right now.  They are coming out on Wednesday to initiate therapy for vestibular rehab.  It has been helpful for her in the past.  Hypertension- Pt denies chest pain, SOB, dizziness, or heart palpitations.  Taking meds as directed w/o problems.  Denies medication side effects.    Follow-up COPD-she is actually been doing well lately.  The cough and congestion have improved significantly.  He is taking her Plavix but gets a lot of bruising on the back of her hands.        ROS    Objective:     BP (!) 147/67   Pulse 73   Ht '5\' 3"'$  (1.6 m)   Wt 114 lb (51.7 kg)   SpO2 100%   BMI 20.19 kg/m     Physical Exam Vitals and nursing note reviewed.  Constitutional:      Appearance: She is well-developed.  HENT:     Head: Normocephalic and atraumatic.  Cardiovascular:     Rate and Rhythm: Normal rate and regular rhythm.     Heart sounds: Normal heart sounds.  Pulmonary:     Effort: Pulmonary effort is normal.     Breath sounds: Normal breath sounds.  Skin:    General:  Skin is warm and dry.  Neurological:     Mental Status: She is alert and oriented to person, place, and time.  Psychiatric:        Behavior: Behavior normal.      No results found for any visits on 09/28/22.     The ASCVD Risk score (Arnett DK, et al., 2019) failed to calculate for the following reasons:   The 2019 ASCVD risk score is only valid for ages 39 to 51   The patient has a prior MI or stroke diagnosis    Assessment & Plan:   Problem List Items Addressed This Visit       Cardiovascular and Mediastinum   Essential hypertension - Primary    Repeat blood pressure was better but still a little bit elevated today.  Will keep an eye on this.        Respiratory   COPD with asthma    Stable.  Chest sounds great today.        Digestive   GERD   Relevant Medications   meclizine (ANTIVERT) 25 MG tablet   pantoprazole (PROTONIX) 40 MG tablet     Nervous and  Auditory   BPPV (benign paroxysmal positional vertigo)    ET will come out and start on Wednesday she would like a refill on the meclizine today.      Relevant Medications   meclizine (ANTIVERT) 25 MG tablet     Other   RESOLVED: Dizziness   Other Visit Diagnoses     Left leg swelling          She does have some trace swelling in that left ankle and foot.  No significant pain or discomfort no tenderness with calf squeeze.  But she is scheduled for some further imaging it sounds like they are evaluating for DVT but the scan is a week out so maybe they are doing more of a arterial workup it is not clear.  No follow-ups on file.    Beatrice Lecher, MD

## 2022-09-28 NOTE — Assessment & Plan Note (Signed)
Repeat blood pressure was better but still a little bit elevated today.  Will keep an eye on this.

## 2022-09-30 DIAGNOSIS — Z87891 Personal history of nicotine dependence: Secondary | ICD-10-CM | POA: Diagnosis not present

## 2022-09-30 DIAGNOSIS — Z8673 Personal history of transient ischemic attack (TIA), and cerebral infarction without residual deficits: Secondary | ICD-10-CM | POA: Diagnosis not present

## 2022-09-30 DIAGNOSIS — E559 Vitamin D deficiency, unspecified: Secondary | ICD-10-CM | POA: Diagnosis not present

## 2022-09-30 DIAGNOSIS — Z7902 Long term (current) use of antithrombotics/antiplatelets: Secondary | ICD-10-CM | POA: Diagnosis not present

## 2022-09-30 DIAGNOSIS — M5030 Other cervical disc degeneration, unspecified cervical region: Secondary | ICD-10-CM | POA: Diagnosis not present

## 2022-09-30 DIAGNOSIS — N1831 Chronic kidney disease, stage 3a: Secondary | ICD-10-CM | POA: Diagnosis not present

## 2022-09-30 DIAGNOSIS — M810 Age-related osteoporosis without current pathological fracture: Secondary | ICD-10-CM | POA: Diagnosis not present

## 2022-09-30 DIAGNOSIS — K219 Gastro-esophageal reflux disease without esophagitis: Secondary | ICD-10-CM | POA: Diagnosis not present

## 2022-09-30 DIAGNOSIS — R7303 Prediabetes: Secondary | ICD-10-CM | POA: Diagnosis not present

## 2022-09-30 DIAGNOSIS — I129 Hypertensive chronic kidney disease with stage 1 through stage 4 chronic kidney disease, or unspecified chronic kidney disease: Secondary | ICD-10-CM | POA: Diagnosis not present

## 2022-09-30 DIAGNOSIS — J4541 Moderate persistent asthma with (acute) exacerbation: Secondary | ICD-10-CM | POA: Diagnosis not present

## 2022-09-30 DIAGNOSIS — H8113 Benign paroxysmal vertigo, bilateral: Secondary | ICD-10-CM | POA: Diagnosis not present

## 2022-09-30 DIAGNOSIS — E785 Hyperlipidemia, unspecified: Secondary | ICD-10-CM | POA: Diagnosis not present

## 2022-09-30 DIAGNOSIS — M4802 Spinal stenosis, cervical region: Secondary | ICD-10-CM | POA: Diagnosis not present

## 2022-09-30 DIAGNOSIS — E538 Deficiency of other specified B group vitamins: Secondary | ICD-10-CM | POA: Diagnosis not present

## 2022-09-30 DIAGNOSIS — J4489 Other specified chronic obstructive pulmonary disease: Secondary | ICD-10-CM | POA: Diagnosis not present

## 2022-10-05 DIAGNOSIS — M7989 Other specified soft tissue disorders: Secondary | ICD-10-CM | POA: Diagnosis not present

## 2022-10-06 DIAGNOSIS — Z87891 Personal history of nicotine dependence: Secondary | ICD-10-CM | POA: Diagnosis not present

## 2022-10-06 DIAGNOSIS — H8113 Benign paroxysmal vertigo, bilateral: Secondary | ICD-10-CM | POA: Diagnosis not present

## 2022-10-06 DIAGNOSIS — E785 Hyperlipidemia, unspecified: Secondary | ICD-10-CM | POA: Diagnosis not present

## 2022-10-06 DIAGNOSIS — N1831 Chronic kidney disease, stage 3a: Secondary | ICD-10-CM | POA: Diagnosis not present

## 2022-10-06 DIAGNOSIS — Z7902 Long term (current) use of antithrombotics/antiplatelets: Secondary | ICD-10-CM | POA: Diagnosis not present

## 2022-10-06 DIAGNOSIS — Z8673 Personal history of transient ischemic attack (TIA), and cerebral infarction without residual deficits: Secondary | ICD-10-CM | POA: Diagnosis not present

## 2022-10-06 DIAGNOSIS — M810 Age-related osteoporosis without current pathological fracture: Secondary | ICD-10-CM | POA: Diagnosis not present

## 2022-10-06 DIAGNOSIS — R7303 Prediabetes: Secondary | ICD-10-CM | POA: Diagnosis not present

## 2022-10-06 DIAGNOSIS — E538 Deficiency of other specified B group vitamins: Secondary | ICD-10-CM | POA: Diagnosis not present

## 2022-10-06 DIAGNOSIS — M4802 Spinal stenosis, cervical region: Secondary | ICD-10-CM | POA: Diagnosis not present

## 2022-10-06 DIAGNOSIS — I129 Hypertensive chronic kidney disease with stage 1 through stage 4 chronic kidney disease, or unspecified chronic kidney disease: Secondary | ICD-10-CM | POA: Diagnosis not present

## 2022-10-06 DIAGNOSIS — M5030 Other cervical disc degeneration, unspecified cervical region: Secondary | ICD-10-CM | POA: Diagnosis not present

## 2022-10-06 DIAGNOSIS — J4489 Other specified chronic obstructive pulmonary disease: Secondary | ICD-10-CM | POA: Diagnosis not present

## 2022-10-06 DIAGNOSIS — K219 Gastro-esophageal reflux disease without esophagitis: Secondary | ICD-10-CM | POA: Diagnosis not present

## 2022-10-06 DIAGNOSIS — J4541 Moderate persistent asthma with (acute) exacerbation: Secondary | ICD-10-CM | POA: Diagnosis not present

## 2022-10-06 DIAGNOSIS — E559 Vitamin D deficiency, unspecified: Secondary | ICD-10-CM | POA: Diagnosis not present

## 2022-10-08 DIAGNOSIS — K219 Gastro-esophageal reflux disease without esophagitis: Secondary | ICD-10-CM | POA: Diagnosis not present

## 2022-10-08 DIAGNOSIS — I129 Hypertensive chronic kidney disease with stage 1 through stage 4 chronic kidney disease, or unspecified chronic kidney disease: Secondary | ICD-10-CM | POA: Diagnosis not present

## 2022-10-08 DIAGNOSIS — E785 Hyperlipidemia, unspecified: Secondary | ICD-10-CM | POA: Diagnosis not present

## 2022-10-08 DIAGNOSIS — Z7902 Long term (current) use of antithrombotics/antiplatelets: Secondary | ICD-10-CM | POA: Diagnosis not present

## 2022-10-08 DIAGNOSIS — M810 Age-related osteoporosis without current pathological fracture: Secondary | ICD-10-CM | POA: Diagnosis not present

## 2022-10-08 DIAGNOSIS — N1831 Chronic kidney disease, stage 3a: Secondary | ICD-10-CM | POA: Diagnosis not present

## 2022-10-08 DIAGNOSIS — E538 Deficiency of other specified B group vitamins: Secondary | ICD-10-CM | POA: Diagnosis not present

## 2022-10-08 DIAGNOSIS — Z8673 Personal history of transient ischemic attack (TIA), and cerebral infarction without residual deficits: Secondary | ICD-10-CM | POA: Diagnosis not present

## 2022-10-08 DIAGNOSIS — M4802 Spinal stenosis, cervical region: Secondary | ICD-10-CM | POA: Diagnosis not present

## 2022-10-08 DIAGNOSIS — M5030 Other cervical disc degeneration, unspecified cervical region: Secondary | ICD-10-CM | POA: Diagnosis not present

## 2022-10-08 DIAGNOSIS — H8113 Benign paroxysmal vertigo, bilateral: Secondary | ICD-10-CM | POA: Diagnosis not present

## 2022-10-08 DIAGNOSIS — J4489 Other specified chronic obstructive pulmonary disease: Secondary | ICD-10-CM | POA: Diagnosis not present

## 2022-10-08 DIAGNOSIS — J4541 Moderate persistent asthma with (acute) exacerbation: Secondary | ICD-10-CM | POA: Diagnosis not present

## 2022-10-08 DIAGNOSIS — Z87891 Personal history of nicotine dependence: Secondary | ICD-10-CM | POA: Diagnosis not present

## 2022-10-08 DIAGNOSIS — E559 Vitamin D deficiency, unspecified: Secondary | ICD-10-CM | POA: Diagnosis not present

## 2022-10-08 DIAGNOSIS — R7303 Prediabetes: Secondary | ICD-10-CM | POA: Diagnosis not present

## 2022-10-09 ENCOUNTER — Other Ambulatory Visit: Payer: Self-pay | Admitting: Family Medicine

## 2022-10-09 DIAGNOSIS — J4489 Other specified chronic obstructive pulmonary disease: Secondary | ICD-10-CM

## 2022-10-13 DIAGNOSIS — M5030 Other cervical disc degeneration, unspecified cervical region: Secondary | ICD-10-CM | POA: Diagnosis not present

## 2022-10-13 DIAGNOSIS — K219 Gastro-esophageal reflux disease without esophagitis: Secondary | ICD-10-CM | POA: Diagnosis not present

## 2022-10-13 DIAGNOSIS — E538 Deficiency of other specified B group vitamins: Secondary | ICD-10-CM | POA: Diagnosis not present

## 2022-10-13 DIAGNOSIS — J4541 Moderate persistent asthma with (acute) exacerbation: Secondary | ICD-10-CM | POA: Diagnosis not present

## 2022-10-13 DIAGNOSIS — Z87891 Personal history of nicotine dependence: Secondary | ICD-10-CM | POA: Diagnosis not present

## 2022-10-13 DIAGNOSIS — M4802 Spinal stenosis, cervical region: Secondary | ICD-10-CM | POA: Diagnosis not present

## 2022-10-13 DIAGNOSIS — E785 Hyperlipidemia, unspecified: Secondary | ICD-10-CM | POA: Diagnosis not present

## 2022-10-13 DIAGNOSIS — E559 Vitamin D deficiency, unspecified: Secondary | ICD-10-CM | POA: Diagnosis not present

## 2022-10-13 DIAGNOSIS — N1831 Chronic kidney disease, stage 3a: Secondary | ICD-10-CM | POA: Diagnosis not present

## 2022-10-13 DIAGNOSIS — I129 Hypertensive chronic kidney disease with stage 1 through stage 4 chronic kidney disease, or unspecified chronic kidney disease: Secondary | ICD-10-CM | POA: Diagnosis not present

## 2022-10-13 DIAGNOSIS — Z8673 Personal history of transient ischemic attack (TIA), and cerebral infarction without residual deficits: Secondary | ICD-10-CM | POA: Diagnosis not present

## 2022-10-13 DIAGNOSIS — Z7902 Long term (current) use of antithrombotics/antiplatelets: Secondary | ICD-10-CM | POA: Diagnosis not present

## 2022-10-13 DIAGNOSIS — R7303 Prediabetes: Secondary | ICD-10-CM | POA: Diagnosis not present

## 2022-10-13 DIAGNOSIS — H8113 Benign paroxysmal vertigo, bilateral: Secondary | ICD-10-CM | POA: Diagnosis not present

## 2022-10-13 DIAGNOSIS — J4489 Other specified chronic obstructive pulmonary disease: Secondary | ICD-10-CM | POA: Diagnosis not present

## 2022-10-13 DIAGNOSIS — M810 Age-related osteoporosis without current pathological fracture: Secondary | ICD-10-CM | POA: Diagnosis not present

## 2022-10-15 DIAGNOSIS — E538 Deficiency of other specified B group vitamins: Secondary | ICD-10-CM | POA: Diagnosis not present

## 2022-10-15 DIAGNOSIS — M810 Age-related osteoporosis without current pathological fracture: Secondary | ICD-10-CM | POA: Diagnosis not present

## 2022-10-15 DIAGNOSIS — J4541 Moderate persistent asthma with (acute) exacerbation: Secondary | ICD-10-CM | POA: Diagnosis not present

## 2022-10-15 DIAGNOSIS — H8113 Benign paroxysmal vertigo, bilateral: Secondary | ICD-10-CM | POA: Diagnosis not present

## 2022-10-15 DIAGNOSIS — Z87891 Personal history of nicotine dependence: Secondary | ICD-10-CM | POA: Diagnosis not present

## 2022-10-15 DIAGNOSIS — N1831 Chronic kidney disease, stage 3a: Secondary | ICD-10-CM | POA: Diagnosis not present

## 2022-10-15 DIAGNOSIS — M5030 Other cervical disc degeneration, unspecified cervical region: Secondary | ICD-10-CM | POA: Diagnosis not present

## 2022-10-15 DIAGNOSIS — Z8673 Personal history of transient ischemic attack (TIA), and cerebral infarction without residual deficits: Secondary | ICD-10-CM | POA: Diagnosis not present

## 2022-10-15 DIAGNOSIS — R7303 Prediabetes: Secondary | ICD-10-CM | POA: Diagnosis not present

## 2022-10-15 DIAGNOSIS — E559 Vitamin D deficiency, unspecified: Secondary | ICD-10-CM | POA: Diagnosis not present

## 2022-10-15 DIAGNOSIS — K219 Gastro-esophageal reflux disease without esophagitis: Secondary | ICD-10-CM | POA: Diagnosis not present

## 2022-10-15 DIAGNOSIS — Z7902 Long term (current) use of antithrombotics/antiplatelets: Secondary | ICD-10-CM | POA: Diagnosis not present

## 2022-10-15 DIAGNOSIS — I129 Hypertensive chronic kidney disease with stage 1 through stage 4 chronic kidney disease, or unspecified chronic kidney disease: Secondary | ICD-10-CM | POA: Diagnosis not present

## 2022-10-15 DIAGNOSIS — E785 Hyperlipidemia, unspecified: Secondary | ICD-10-CM | POA: Diagnosis not present

## 2022-10-15 DIAGNOSIS — M4802 Spinal stenosis, cervical region: Secondary | ICD-10-CM | POA: Diagnosis not present

## 2022-10-15 DIAGNOSIS — J4489 Other specified chronic obstructive pulmonary disease: Secondary | ICD-10-CM | POA: Diagnosis not present

## 2022-10-20 DIAGNOSIS — E785 Hyperlipidemia, unspecified: Secondary | ICD-10-CM | POA: Diagnosis not present

## 2022-10-20 DIAGNOSIS — M4802 Spinal stenosis, cervical region: Secondary | ICD-10-CM | POA: Diagnosis not present

## 2022-10-20 DIAGNOSIS — Z7902 Long term (current) use of antithrombotics/antiplatelets: Secondary | ICD-10-CM | POA: Diagnosis not present

## 2022-10-20 DIAGNOSIS — R7303 Prediabetes: Secondary | ICD-10-CM | POA: Diagnosis not present

## 2022-10-20 DIAGNOSIS — M810 Age-related osteoporosis without current pathological fracture: Secondary | ICD-10-CM | POA: Diagnosis not present

## 2022-10-20 DIAGNOSIS — K219 Gastro-esophageal reflux disease without esophagitis: Secondary | ICD-10-CM | POA: Diagnosis not present

## 2022-10-20 DIAGNOSIS — Z87891 Personal history of nicotine dependence: Secondary | ICD-10-CM | POA: Diagnosis not present

## 2022-10-20 DIAGNOSIS — J4489 Other specified chronic obstructive pulmonary disease: Secondary | ICD-10-CM | POA: Diagnosis not present

## 2022-10-20 DIAGNOSIS — I129 Hypertensive chronic kidney disease with stage 1 through stage 4 chronic kidney disease, or unspecified chronic kidney disease: Secondary | ICD-10-CM | POA: Diagnosis not present

## 2022-10-20 DIAGNOSIS — N1831 Chronic kidney disease, stage 3a: Secondary | ICD-10-CM | POA: Diagnosis not present

## 2022-10-20 DIAGNOSIS — H8113 Benign paroxysmal vertigo, bilateral: Secondary | ICD-10-CM | POA: Diagnosis not present

## 2022-10-20 DIAGNOSIS — E559 Vitamin D deficiency, unspecified: Secondary | ICD-10-CM | POA: Diagnosis not present

## 2022-10-20 DIAGNOSIS — Z8673 Personal history of transient ischemic attack (TIA), and cerebral infarction without residual deficits: Secondary | ICD-10-CM | POA: Diagnosis not present

## 2022-10-20 DIAGNOSIS — E538 Deficiency of other specified B group vitamins: Secondary | ICD-10-CM | POA: Diagnosis not present

## 2022-10-20 DIAGNOSIS — J4541 Moderate persistent asthma with (acute) exacerbation: Secondary | ICD-10-CM | POA: Diagnosis not present

## 2022-10-20 DIAGNOSIS — M5030 Other cervical disc degeneration, unspecified cervical region: Secondary | ICD-10-CM | POA: Diagnosis not present

## 2022-10-23 ENCOUNTER — Other Ambulatory Visit: Payer: Self-pay | Admitting: Family Medicine

## 2022-10-23 DIAGNOSIS — R110 Nausea: Secondary | ICD-10-CM

## 2022-10-27 DIAGNOSIS — I129 Hypertensive chronic kidney disease with stage 1 through stage 4 chronic kidney disease, or unspecified chronic kidney disease: Secondary | ICD-10-CM | POA: Diagnosis not present

## 2022-10-27 DIAGNOSIS — M5030 Other cervical disc degeneration, unspecified cervical region: Secondary | ICD-10-CM | POA: Diagnosis not present

## 2022-10-27 DIAGNOSIS — J4541 Moderate persistent asthma with (acute) exacerbation: Secondary | ICD-10-CM | POA: Diagnosis not present

## 2022-10-27 DIAGNOSIS — J4489 Other specified chronic obstructive pulmonary disease: Secondary | ICD-10-CM | POA: Diagnosis not present

## 2022-10-27 DIAGNOSIS — N1831 Chronic kidney disease, stage 3a: Secondary | ICD-10-CM | POA: Diagnosis not present

## 2022-10-27 DIAGNOSIS — M4802 Spinal stenosis, cervical region: Secondary | ICD-10-CM | POA: Diagnosis not present

## 2022-10-27 DIAGNOSIS — M810 Age-related osteoporosis without current pathological fracture: Secondary | ICD-10-CM | POA: Diagnosis not present

## 2022-10-27 DIAGNOSIS — E559 Vitamin D deficiency, unspecified: Secondary | ICD-10-CM | POA: Diagnosis not present

## 2022-10-27 DIAGNOSIS — Z7902 Long term (current) use of antithrombotics/antiplatelets: Secondary | ICD-10-CM | POA: Diagnosis not present

## 2022-10-27 DIAGNOSIS — Z87891 Personal history of nicotine dependence: Secondary | ICD-10-CM | POA: Diagnosis not present

## 2022-10-27 DIAGNOSIS — H8113 Benign paroxysmal vertigo, bilateral: Secondary | ICD-10-CM | POA: Diagnosis not present

## 2022-10-27 DIAGNOSIS — Z8673 Personal history of transient ischemic attack (TIA), and cerebral infarction without residual deficits: Secondary | ICD-10-CM | POA: Diagnosis not present

## 2022-10-27 DIAGNOSIS — E785 Hyperlipidemia, unspecified: Secondary | ICD-10-CM | POA: Diagnosis not present

## 2022-10-27 DIAGNOSIS — E538 Deficiency of other specified B group vitamins: Secondary | ICD-10-CM | POA: Diagnosis not present

## 2022-10-27 DIAGNOSIS — R7303 Prediabetes: Secondary | ICD-10-CM | POA: Diagnosis not present

## 2022-10-27 DIAGNOSIS — K219 Gastro-esophageal reflux disease without esophagitis: Secondary | ICD-10-CM | POA: Diagnosis not present

## 2022-11-03 DIAGNOSIS — R7303 Prediabetes: Secondary | ICD-10-CM | POA: Diagnosis not present

## 2022-11-03 DIAGNOSIS — Z8673 Personal history of transient ischemic attack (TIA), and cerebral infarction without residual deficits: Secondary | ICD-10-CM | POA: Diagnosis not present

## 2022-11-03 DIAGNOSIS — J4489 Other specified chronic obstructive pulmonary disease: Secondary | ICD-10-CM | POA: Diagnosis not present

## 2022-11-03 DIAGNOSIS — M810 Age-related osteoporosis without current pathological fracture: Secondary | ICD-10-CM | POA: Diagnosis not present

## 2022-11-03 DIAGNOSIS — Z7902 Long term (current) use of antithrombotics/antiplatelets: Secondary | ICD-10-CM | POA: Diagnosis not present

## 2022-11-03 DIAGNOSIS — M5030 Other cervical disc degeneration, unspecified cervical region: Secondary | ICD-10-CM | POA: Diagnosis not present

## 2022-11-03 DIAGNOSIS — E785 Hyperlipidemia, unspecified: Secondary | ICD-10-CM | POA: Diagnosis not present

## 2022-11-03 DIAGNOSIS — I129 Hypertensive chronic kidney disease with stage 1 through stage 4 chronic kidney disease, or unspecified chronic kidney disease: Secondary | ICD-10-CM | POA: Diagnosis not present

## 2022-11-03 DIAGNOSIS — N1831 Chronic kidney disease, stage 3a: Secondary | ICD-10-CM | POA: Diagnosis not present

## 2022-11-03 DIAGNOSIS — J4541 Moderate persistent asthma with (acute) exacerbation: Secondary | ICD-10-CM | POA: Diagnosis not present

## 2022-11-03 DIAGNOSIS — K219 Gastro-esophageal reflux disease without esophagitis: Secondary | ICD-10-CM | POA: Diagnosis not present

## 2022-11-03 DIAGNOSIS — Z87891 Personal history of nicotine dependence: Secondary | ICD-10-CM | POA: Diagnosis not present

## 2022-11-03 DIAGNOSIS — E559 Vitamin D deficiency, unspecified: Secondary | ICD-10-CM | POA: Diagnosis not present

## 2022-11-03 DIAGNOSIS — H8113 Benign paroxysmal vertigo, bilateral: Secondary | ICD-10-CM | POA: Diagnosis not present

## 2022-11-03 DIAGNOSIS — E538 Deficiency of other specified B group vitamins: Secondary | ICD-10-CM | POA: Diagnosis not present

## 2022-11-03 DIAGNOSIS — M4802 Spinal stenosis, cervical region: Secondary | ICD-10-CM | POA: Diagnosis not present

## 2022-11-09 DIAGNOSIS — K219 Gastro-esophageal reflux disease without esophagitis: Secondary | ICD-10-CM | POA: Diagnosis not present

## 2022-11-09 DIAGNOSIS — J4541 Moderate persistent asthma with (acute) exacerbation: Secondary | ICD-10-CM | POA: Diagnosis not present

## 2022-11-09 DIAGNOSIS — E538 Deficiency of other specified B group vitamins: Secondary | ICD-10-CM | POA: Diagnosis not present

## 2022-11-09 DIAGNOSIS — Z87891 Personal history of nicotine dependence: Secondary | ICD-10-CM | POA: Diagnosis not present

## 2022-11-09 DIAGNOSIS — I129 Hypertensive chronic kidney disease with stage 1 through stage 4 chronic kidney disease, or unspecified chronic kidney disease: Secondary | ICD-10-CM | POA: Diagnosis not present

## 2022-11-09 DIAGNOSIS — M5030 Other cervical disc degeneration, unspecified cervical region: Secondary | ICD-10-CM | POA: Diagnosis not present

## 2022-11-09 DIAGNOSIS — M810 Age-related osteoporosis without current pathological fracture: Secondary | ICD-10-CM | POA: Diagnosis not present

## 2022-11-09 DIAGNOSIS — R7303 Prediabetes: Secondary | ICD-10-CM | POA: Diagnosis not present

## 2022-11-09 DIAGNOSIS — E559 Vitamin D deficiency, unspecified: Secondary | ICD-10-CM | POA: Diagnosis not present

## 2022-11-09 DIAGNOSIS — M4802 Spinal stenosis, cervical region: Secondary | ICD-10-CM | POA: Diagnosis not present

## 2022-11-09 DIAGNOSIS — H8113 Benign paroxysmal vertigo, bilateral: Secondary | ICD-10-CM | POA: Diagnosis not present

## 2022-11-09 DIAGNOSIS — E785 Hyperlipidemia, unspecified: Secondary | ICD-10-CM | POA: Diagnosis not present

## 2022-11-09 DIAGNOSIS — N1831 Chronic kidney disease, stage 3a: Secondary | ICD-10-CM | POA: Diagnosis not present

## 2022-11-09 DIAGNOSIS — J4489 Other specified chronic obstructive pulmonary disease: Secondary | ICD-10-CM | POA: Diagnosis not present

## 2022-11-09 DIAGNOSIS — Z7902 Long term (current) use of antithrombotics/antiplatelets: Secondary | ICD-10-CM | POA: Diagnosis not present

## 2022-11-09 DIAGNOSIS — Z8673 Personal history of transient ischemic attack (TIA), and cerebral infarction without residual deficits: Secondary | ICD-10-CM | POA: Diagnosis not present

## 2022-11-16 DIAGNOSIS — H8113 Benign paroxysmal vertigo, bilateral: Secondary | ICD-10-CM | POA: Diagnosis not present

## 2022-11-16 DIAGNOSIS — M810 Age-related osteoporosis without current pathological fracture: Secondary | ICD-10-CM | POA: Diagnosis not present

## 2022-11-16 DIAGNOSIS — I129 Hypertensive chronic kidney disease with stage 1 through stage 4 chronic kidney disease, or unspecified chronic kidney disease: Secondary | ICD-10-CM | POA: Diagnosis not present

## 2022-11-16 DIAGNOSIS — M4802 Spinal stenosis, cervical region: Secondary | ICD-10-CM | POA: Diagnosis not present

## 2022-11-16 DIAGNOSIS — Z8673 Personal history of transient ischemic attack (TIA), and cerebral infarction without residual deficits: Secondary | ICD-10-CM | POA: Diagnosis not present

## 2022-11-16 DIAGNOSIS — M5030 Other cervical disc degeneration, unspecified cervical region: Secondary | ICD-10-CM | POA: Diagnosis not present

## 2022-11-16 DIAGNOSIS — E538 Deficiency of other specified B group vitamins: Secondary | ICD-10-CM | POA: Diagnosis not present

## 2022-11-16 DIAGNOSIS — E785 Hyperlipidemia, unspecified: Secondary | ICD-10-CM | POA: Diagnosis not present

## 2022-11-16 DIAGNOSIS — N1831 Chronic kidney disease, stage 3a: Secondary | ICD-10-CM | POA: Diagnosis not present

## 2022-11-16 DIAGNOSIS — E559 Vitamin D deficiency, unspecified: Secondary | ICD-10-CM | POA: Diagnosis not present

## 2022-11-16 DIAGNOSIS — R7303 Prediabetes: Secondary | ICD-10-CM | POA: Diagnosis not present

## 2022-11-16 DIAGNOSIS — Z87891 Personal history of nicotine dependence: Secondary | ICD-10-CM | POA: Diagnosis not present

## 2022-11-16 DIAGNOSIS — J4541 Moderate persistent asthma with (acute) exacerbation: Secondary | ICD-10-CM | POA: Diagnosis not present

## 2022-11-16 DIAGNOSIS — Z7902 Long term (current) use of antithrombotics/antiplatelets: Secondary | ICD-10-CM | POA: Diagnosis not present

## 2022-11-16 DIAGNOSIS — J4489 Other specified chronic obstructive pulmonary disease: Secondary | ICD-10-CM | POA: Diagnosis not present

## 2022-11-16 DIAGNOSIS — K219 Gastro-esophageal reflux disease without esophagitis: Secondary | ICD-10-CM | POA: Diagnosis not present

## 2022-11-23 DIAGNOSIS — M810 Age-related osteoporosis without current pathological fracture: Secondary | ICD-10-CM | POA: Diagnosis not present

## 2022-11-23 DIAGNOSIS — I129 Hypertensive chronic kidney disease with stage 1 through stage 4 chronic kidney disease, or unspecified chronic kidney disease: Secondary | ICD-10-CM | POA: Diagnosis not present

## 2022-11-23 DIAGNOSIS — K219 Gastro-esophageal reflux disease without esophagitis: Secondary | ICD-10-CM | POA: Diagnosis not present

## 2022-11-23 DIAGNOSIS — H8113 Benign paroxysmal vertigo, bilateral: Secondary | ICD-10-CM | POA: Diagnosis not present

## 2022-11-23 DIAGNOSIS — N1831 Chronic kidney disease, stage 3a: Secondary | ICD-10-CM | POA: Diagnosis not present

## 2022-11-23 DIAGNOSIS — E538 Deficiency of other specified B group vitamins: Secondary | ICD-10-CM | POA: Diagnosis not present

## 2022-11-23 DIAGNOSIS — M5030 Other cervical disc degeneration, unspecified cervical region: Secondary | ICD-10-CM | POA: Diagnosis not present

## 2022-11-23 DIAGNOSIS — J4541 Moderate persistent asthma with (acute) exacerbation: Secondary | ICD-10-CM | POA: Diagnosis not present

## 2022-11-23 DIAGNOSIS — R7303 Prediabetes: Secondary | ICD-10-CM | POA: Diagnosis not present

## 2022-11-23 DIAGNOSIS — J4489 Other specified chronic obstructive pulmonary disease: Secondary | ICD-10-CM | POA: Diagnosis not present

## 2022-11-23 DIAGNOSIS — Z8673 Personal history of transient ischemic attack (TIA), and cerebral infarction without residual deficits: Secondary | ICD-10-CM | POA: Diagnosis not present

## 2022-11-23 DIAGNOSIS — E785 Hyperlipidemia, unspecified: Secondary | ICD-10-CM | POA: Diagnosis not present

## 2022-11-23 DIAGNOSIS — E559 Vitamin D deficiency, unspecified: Secondary | ICD-10-CM | POA: Diagnosis not present

## 2022-11-23 DIAGNOSIS — Z87891 Personal history of nicotine dependence: Secondary | ICD-10-CM | POA: Diagnosis not present

## 2022-11-23 DIAGNOSIS — Z7902 Long term (current) use of antithrombotics/antiplatelets: Secondary | ICD-10-CM | POA: Diagnosis not present

## 2022-11-23 DIAGNOSIS — M4802 Spinal stenosis, cervical region: Secondary | ICD-10-CM | POA: Diagnosis not present

## 2022-12-24 ENCOUNTER — Other Ambulatory Visit: Payer: Self-pay | Admitting: Family Medicine

## 2022-12-24 DIAGNOSIS — I10 Essential (primary) hypertension: Secondary | ICD-10-CM

## 2022-12-24 DIAGNOSIS — E782 Mixed hyperlipidemia: Secondary | ICD-10-CM

## 2022-12-24 DIAGNOSIS — I63412 Cerebral infarction due to embolism of left middle cerebral artery: Secondary | ICD-10-CM

## 2023-01-18 ENCOUNTER — Ambulatory Visit (INDEPENDENT_AMBULATORY_CARE_PROVIDER_SITE_OTHER): Payer: 59 | Admitting: Physician Assistant

## 2023-01-18 DIAGNOSIS — Z0000 Encounter for general adult medical examination without abnormal findings: Secondary | ICD-10-CM | POA: Diagnosis not present

## 2023-01-18 NOTE — Progress Notes (Signed)
MEDICARE ANNUAL WELLNESS VISIT  01/18/2023  Telephone Visit Disclaimer This Medicare AWV was conducted by telephone due to national recommendations for restrictions regarding the COVID-19 Pandemic (e.g. social distancing).  I verified, using two identifiers, that I am speaking with Erika Lynch or their authorized healthcare agent. I discussed the limitations, risks, security, and privacy concerns of performing an evaluation and management service by telephone and the potential availability of an in-person appointment in the future. The patient expressed understanding and agreed to proceed.  Location of Patient: Home Location of Provider (nurse):  Provider home  Subjective:    STELLALUNA GUARDIAN is a 89 y.o. female patient of [name], Barbarann Ehlers, MD who had a Medicare Annual Wellness Visit today via telephone. Ethelwyn is Retired and lives alone. she had 4 children; one has deceased. she reports that she is socially active and does interact with friends/family regularly. she is moderately physically active and enjoys sewing, workbooks, coloring books and puzzles.  Patient Care Team: Agapito Games, MD as PCP - General Gabriel Carina, [facility] as Pharmacist (Pharmacist)     01/18/2023    2:10 PM 01/12/2022    2:01 PM 08/19/2020    1:11 PM 08/14/2019    1:02 PM 08/10/2018   10:46 AM 11/23/2013    3:05 PM  Advanced Directives  Does Patient Have a Medical Advance Directive? Yes Yes Yes Yes Yes Patient has advance directive, copy not in chart  Type of Advance Directive Living will Healthcare Power of Attorney Living will Healthcare Power of [place];Living will Healthcare Power of [place];Living will Healthcare Power of [place];Living will  Does patient want to make changes to medical advance directive? No - Patient declined No - Patient declined No - Patient declined No - Patient declined No - Patient declined   Copy of Healthcare Power of Attorney in Chart?  Yes - validated most recent copy  scanned in chart (See row information)  Yes - validated most recent copy scanned in chart (See row information) Yes - validated most recent copy scanned in chart (See row information) Copy requested from family    Hospital Utilization Over the Past 12 Months: # of hospitalizations or ER visits: 1 # of surgeries: 0  Review of Systems    Patient reports that her overall health is worse compared to last year.  History obtained from chart review and the patient and her daughter, Irena Cords.   Patient Reported Readings (BP, Pulse, CBG, Weight, etc) none  Pain Assessment Pain : No/denies pain     Current Medications & Allergies (verified) Allergies as of 01/18/2023       Reactions   Other Other (See Comments)   Pollen-- sneezing   Sulfa Antibiotics Rash   Aspirin    Cisapride    Dtap-ipv Vaccine    Imipramine Hcl    Lexapro [escitalopram] Other (See Comments)   Insomnia, increased anxiety   Mometasone Swelling   Throat swelling   Penicillins Rash   Sulfonamide Derivatives Rash        Medication List        Accurate as of January 18, 2023  2:24 PM. If you have any questions, ask your nurse or doctor.          acetaminophen 325 MG tablet Commonly known as: TYLENOL Take by mouth. As needed   albuterol 108 (90 Base) MCG/ACT inhaler Commonly known as: VENTOLIN HFA Inhale 2 puffs into the lungs every 6 (six) hours as needed for wheezing or shortness  of breath.   alendronate 70 MG tablet Commonly known as: FOSAMAX Take 1 tablet (70 mg total) by mouth every 7 (seven) days. Take with a full glass of water on an empty stomach.   atorvastatin 20 MG tablet Commonly known as: LIPITOR Take 1 tablet (20 mg total) by mouth daily.   calcium carbonate 750 MG chewable tablet Commonly known as: TUMS EX Chew 1 tablet by mouth daily.   clopidogrel 75 MG tablet Commonly known as: PLAVIX Take 1 tablet (75 mg total) by mouth daily.   DULoxetine 30 MG capsule Commonly  known as: CYMBALTA Take 1 capsule (30 mg total) by mouth daily. What changed: how much to take   loratadine 10 MG tablet Commonly known as: CLARITIN Take 1 tablet (10 mg total) by mouth daily.   losartan 25 MG tablet Commonly known as: COZAAR Take 1 tablet (25 mg total) by mouth daily.   meclizine 25 MG tablet Commonly known as: ANTIVERT Take 1 tablet (25 mg total) by mouth 3 (three) times daily as needed for dizziness.   metoprolol succinate 50 MG 24 hr tablet Commonly known as: TOPROL-XL Take 1 tablet (50 mg total) by mouth daily.   ondansetron 4 MG disintegrating tablet Commonly known as: ZOFRAN-ODT Take 1 tablet (4 mg total) by mouth every 8 (eight) hours as needed for nausea or vomiting.   pantoprazole 40 MG tablet Commonly known as: PROTONIX Take 1 tablet (40 mg total) by mouth daily.   Trelegy Ellipta 100-62.5-25 MCG/ACT Aepb Generic drug: Fluticasone-Umeclidin-Vilant Inhale 1 puff into the lungs daily.        History (reviewed): Past Medical History:  Diagnosis Date   Cataract    Dyskinesia of esophagus    Hypertension    Kidney stone    Stroke (HCC) 08/09/2021   Uterine cancer (HCC)    Vertigo 07/16/2020   Past Surgical History:  Procedure Laterality Date   ABDOMINAL HYSTERECTOMY     Uterine cancer   BACK SURGERY     CATARACT EXTRACTION, BILATERAL     Family History  Problem Relation Age of Onset   Heart attack Father    Stroke Father    Colon cancer Sister    Cancer Sister        lung   Heart attack Brother    Heart attack Brother    Breast cancer Other    Social History   Socioeconomic History   Marital status: Divorced    Spouse name: Not on file   Number of children: 4   Years of education: 8th    Highest education level: 8th grade  Occupational History   Occupation: Retired.     Employer: RETIRED    Comment: [employer]  Tobacco Use   Smoking status: Former    Types: Cigarettes    Start date: 08/10/2021   Smokeless tobacco:  Never   Tobacco comments:    smokes cig . less than a half pack a week. Patient has not smoked in a few weeks   Vaping Use   Vaping Use: Never used  Substance and Sexual Activity   Alcohol use: No   Drug use: No   Sexual activity: Not Currently  Other Topics Concern   Not on file  Social History Narrative   3 living children: Michael Boston, Danny.  Daily caffeine use. Regular exercise everyday, walking and doing stretching. She looks to sewing, puzzles, workbooks and adult coloring books.   Social Determinants of Corporate investment banker  Strain: Low Risk  (01/18/2023)   Overall Financial Resource Strain (CARDIA)    Difficulty of Paying Living Expenses: Not hard at all  Food Insecurity: No Food Insecurity (01/18/2023)   Hunger Vital Sign    Worried About Running Out of Food in the Last Year: Never true    Ran Out of Food in the Last Year: Never true  Transportation Needs: No Transportation Needs (01/18/2023)   PRAPARE - Administrator, Civil Service (Medical): No    Lack of Transportation (Non-Medical): No  Physical Activity: Sufficiently Active (01/18/2023)   Exercise Vital Sign    Days of Exercise per Week: 7 days    Minutes of Exercise per Session: 30 min  Stress: No Stress Concern Present (01/18/2023)   [employer] of Occupational Health - Occupational Stress Questionnaire    Feeling of Stress : Not at all  Social Connections: Socially Isolated (01/18/2023)   Social Connection and Isolation Panel [NHANES]    Frequency of Communication with Friends and Family: Twice a week    Frequency of Social Gatherings with Friends and Family: Never    Attends Religious Services: Never    Database administrator or Organizations: No    Attends Banker Meetings: Never    Marital Status: Divorced    Activities of Daily Living    01/18/2023    2:17 PM  In your present state of health, do you have any difficulty performing the following activities:  Hearing?  0  Vision? 0  Difficulty concentrating or making decisions? 1  Comment some  Walking or climbing stairs? 0  Dressing or bathing? 0  Doing errands, shopping? 1  Comment her daughter usually takes her  Quarry manager and eating ? N  Using the Toilet? N  In the past six months, have you accidently leaked urine? N  Do you have problems with loss of bowel control? N  Managing your Medications? N  Managing your Finances? N  Housekeeping or managing your Housekeeping? N    Patient Education/ Literacy How often do you need to have someone help you when you read instructions, pamphlets, or other written materials from your doctor or pharmacy?: 1 - Never What is the last grade level you completed in school?: 8th grade  Exercise Current Exercise Habits: Home exercise routine, Type of exercise: stretching, Time (Minutes): 30, Frequency (Times/Week): 7, Weekly Exercise (Minutes/Week): 210, Intensity: Moderate, Exercise limited by: neurologic condition(s)  Diet Patient reports consuming 3 meals a day and 1 snack(s) a day Patient reports that her primary diet is: Regular Patient reports that she does have regular access to food.   Depression Screen    01/18/2023    2:10 PM 06/15/2022    4:33 PM 04/20/2022    2:17 PM 01/12/2022    2:01 PM 12/08/2021    4:21 PM 11/10/2021    1:40 PM 11/10/2021   10:52 AM  PHQ 2/9 Scores  PHQ - 2 Score 0 0 0 0 0 1 0  PHQ- 9 Score      7      Fall Risk    01/18/2023    2:10 PM 04/20/2022    2:17 PM 01/12/2022    2:01 PM 12/08/2021    4:20 PM 11/10/2021   10:52 AM  Fall Risk   Falls in the past year? 0 0 0 0 0  Number falls in past yr: 0 0 0 0 0  Injury with Fall? 0 0 0 0 0  Risk for fall due to : No Fall Risks No Fall Risks No Fall Risks No Fall Risks No Fall Risks  Follow up Falls evaluation completed Falls evaluation completed Falls evaluation completed Falls evaluation completed Falls prevention discussed;Falls evaluation completed     Objective:   JYLA RHEAD seemed alert and oriented and she participated appropriately during our telephone visit.  Blood Pressure Weight BMI  BP Readings from Last 3 Encounters:  09/28/22 (!) 147/67  09/08/22 (!) 144/84  08/20/22 (!) 150/87   Wt Readings from Last 3 Encounters:  09/28/22 114 lb (51.7 kg)  09/08/22 114 lb (51.7 kg)  08/20/22 115 lb (52.2 kg)   BMI Readings from Last 1 Encounters:  09/28/22 20.19 kg/m    *Unable to obtain current vital signs, weight, and BMI due to telephone visit type  Hearing/Vision  Ruthella did not seem to have difficulty with hearing/understanding during the telephone conversation Reports that she has not had a formal eye exam by an eye care professional within the past year Reports that she has not had a formal hearing evaluation within the past year *Unable to fully assess hearing and vision during telephone visit type  Cognitive Function:    01/18/2023    2:19 PM 01/12/2022    2:12 PM 08/19/2020    1:14 PM 08/14/2019    1:07 PM 08/10/2018   10:50 AM  6CIT Screen  What Year? 0 points 4 points 0 points 0 points 0 points  What month? 0 points 0 points 0 points 0 points 0 points  What time? 0 points 0 points 0 points 0 points 0 points  Count back from 20 0 points 2 points 0 points 2 points 2 points  Months in reverse 4 points 4 points 0 points 2 points 0 points  Repeat phrase 2 points 2 points 2 points 0 points 0 points  Total Score 6 points 12 points 2 points 4 points 2 points   (Normal:0-7, Significant for Dysfunction: >8)  Normal Cognitive Function Screening: Yes   Immunization & Health Maintenance Record Immunization History  Administered Date(s) Administered   Fluad Quad(high Dose 65+) 08/14/2019, 06/11/2020   Influenza Split 05/04/2012   Influenza Whole 05/17/2008, 05/27/2009, 04/17/2010   Influenza, High Dose Seasonal PF 04/15/2016, 03/25/2017, 08/08/2018   Influenza-Unspecified 04/03/2013, 04/20/2015   Pneumococcal Conjugate-13  05/13/2015   Pneumococcal Polysaccharide-23 12/04/2008   Td 12/04/2008   Zoster, Live 05/07/2010    Health Maintenance  Topic Date Due   DTaP/Tdap/Td (2 - Tdap) 12/05/2018   COVID-19 Vaccine (1) 06/26/2023 (Originally 01/28/1938)   Zoster Vaccines- Shingrix (1 of 2) 09/29/2023 (Originally 01/27/1952)   INFLUENZA VACCINE  03/04/2023   Medicare Annual Wellness (AWV)  01/18/2024   Pneumonia Vaccine 76+ Years old  Completed   DEXA SCAN  Completed   HPV VACCINES  Aged Out       Assessment  This is a routine wellness examination for [employer].  Health Maintenance: Due or Overdue Health Maintenance Due  Topic Date Due   DTaP/Tdap/Td (2 - Tdap) 12/05/2018    Erika Lynch does not need a referral for Community Assistance: Care Management:   no Social Work:    no Prescription Assistance:  no Nutrition/Diabetes Education:  no   Plan:  Personalized Goals  Goals Addressed               This Visit's Progress     Patient Stated (pt-stated)        Patient stated that  she would like to be able to write better and work on her speech since her stroke.        Personalized Health Maintenance & Screening Recommendations  Td vaccine Shingles vaccine  Lung Cancer Screening Recommended: no (Low Dose CT Chest recommended if Age 73-80 years, 20 pack-year currently smoking OR have quit w/in past 15 years) Hepatitis C Screening recommended: no HIV Screening recommended: no  Advanced Directives: Written information was not prepared per patient's request.  Referrals & Orders No orders of the defined types were placed in this encounter.   Follow-up Plan Follow-up with Agapito Games, MD as planned Schedule td vaccine and shingles vaccine at the pharmacy. Medicare wellness visit in one year.  Patient will access AVS on my chart.   I have personally reviewed and noted the following in the patient's chart:   Medical and social history Use of alcohol, tobacco or  illicit drugs  Current medications and supplements Functional ability and status Nutritional status Physical activity Advanced directives List of other physicians Hospitalizations, surgeries, and ER visits in previous 12 months Vitals Screenings to include cognitive, depression, and falls Referrals and appointments  In addition, I have reviewed and discussed with Erika Lynch certain preventive protocols, quality metrics, and best practice recommendations. A written personalized care plan for preventive services as well as general preventive health recommendations is available and can be mailed to the patient at her request.      Modesto Charon, RN BSN  01/18/2023

## 2023-01-18 NOTE — Patient Instructions (Addendum)
MEDICARE ANNUAL WELLNESS VISIT Health Maintenance Summary and Written Plan of Care  [name] ,  Thank you for allowing me to perform your Medicare Annual Wellness Visit and for your ongoing commitment to your health.   Health Maintenance & Immunization History Health Maintenance  Topic Date Due   DTaP/Tdap/Td (2 - Tdap) 12/05/2018   COVID-19 Vaccine (1) 06/26/2023 (Originally 01/28/1938)   Zoster Vaccines- Shingrix (1 of 2) 09/29/2023 (Originally 01/27/1952)   INFLUENZA VACCINE  03/04/2023   Medicare Annual Wellness (AWV)  01/18/2024   Pneumonia Vaccine 50+ Years old  Completed   DEXA SCAN  Completed   HPV VACCINES  Aged Out   Immunization History  Administered Date(s) Administered   Fluad Quad(high Dose 65+) 08/14/2019, 06/11/2020   Influenza Split 05/04/2012   Influenza Whole 05/17/2008, 05/27/2009, 04/17/2010   Influenza, High Dose Seasonal PF 04/15/2016, 03/25/2017, 08/08/2018   Influenza-Unspecified 04/03/2013, 04/20/2015   Pneumococcal Conjugate-13 05/13/2015   Pneumococcal Polysaccharide-23 12/04/2008   Td 12/04/2008   Zoster, Live 05/07/2010    These are the patient goals that we discussed:  Goals Addressed               This Visit's Progress     Patient Stated (pt-stated)        Patient stated that she would like to be able to write better and work on her speech since her stroke.          This is a list of Health Maintenance Items that are overdue or due now: Health Maintenance Due  Topic Date Due   DTaP/Tdap/Td (2 - Tdap) 12/05/2018  Shingles vaccine  Orders/Referrals Placed Today: No orders of the defined types were placed in this encounter.  (Contact our referral department at [phone] if you have not spoken with someone about your referral appointment within the next 5 days)    Follow-up Plan Follow-up with Agapito Games, MD as planned Schedule td vaccine and shingles vaccine at the pharmacy. Medicare wellness visit in one  year.  Patient will access AVS on my chart.      Health Maintenance, Female Adopting a healthy lifestyle and getting preventive care are important in promoting health and wellness. Ask your health care provider about: The right schedule for you to have regular tests and exams. Things you can do on your own to prevent diseases and keep yourself healthy. What should I know about diet, weight, and exercise? Eat a healthy diet  Eat a diet that includes plenty of vegetables, fruits, low-fat dairy products, and lean protein. Do not eat a lot of foods that are high in solid fats, added sugars, or sodium. Maintain a healthy weight Body mass index (BMI) is used to identify weight problems. It estimates body fat based on height and weight. Your health care provider can help determine your BMI and help you achieve or maintain a healthy weight. Get regular exercise Get regular exercise. This is one of the most important things you can do for your health. Most adults should: Exercise for at least 150 minutes each week. The exercise should increase your heart rate and make you sweat (moderate-intensity exercise). Do strengthening exercises at least twice a week. This is in addition to the moderate-intensity exercise. Spend less time sitting. Even light physical activity can be beneficial. Watch cholesterol and blood lipids Have your blood tested for lipids and cholesterol at 87 years of age, then have this test every 5 years. Have your cholesterol levels checked more often if: Your  lipid or cholesterol levels are high. You are older than 87 years of age. You are at high risk for heart disease. What should I know about cancer screening? Depending on your health history and family history, you may need to have cancer screening at various ages. This may include screening for: Breast cancer. Cervical cancer. Colorectal cancer. Skin cancer. Lung cancer. What should I know about heart disease,  diabetes, and high blood pressure? Blood pressure and heart disease High blood pressure causes heart disease and increases the risk of stroke. This is more likely to develop in people who have high blood pressure readings or are overweight. Have your blood pressure checked: Every 3-5 years if you are 86-37 years of age. Every year if you are 42 years old or older. Diabetes Have regular diabetes screenings. This checks your fasting blood sugar level. Have the screening done: Once every three years after age 66 if you are at a normal weight and have a low risk for diabetes. More often and at a younger age if you are overweight or have a high risk for diabetes. What should I know about preventing infection? Hepatitis B If you have a higher risk for hepatitis B, you should be screened for this virus. Talk with your health care provider to find out if you are at risk for hepatitis B infection. Hepatitis C Testing is recommended for: Everyone born from [birth year] through 1963. Anyone with known risk factors for hepatitis C. Sexually transmitted infections (STIs) Get screened for STIs, including gonorrhea and chlamydia, if: You are sexually active and are younger than 87 years of age. You are older than 87 years of age and your health care provider tells you that you are at risk for this type of infection. Your sexual activity has changed since you were last screened, and you are at increased risk for chlamydia or gonorrhea. Ask your health care provider if you are at risk. Ask your health care provider about whether you are at high risk for HIV. Your health care provider may recommend a prescription medicine to help prevent HIV infection. If you choose to take medicine to prevent HIV, you should first get tested for HIV. You should then be tested every 3 months for as long as you are taking the medicine. Pregnancy If you are about to stop having your period (premenopausal) and you may become pregnant,  seek counseling before you get pregnant. Take 400 to 800 micrograms (mcg) of folic acid every day if you become pregnant. Ask for birth control (contraception) if you want to prevent pregnancy. Osteoporosis and menopause Osteoporosis is a disease in which the bones lose minerals and strength with aging. This can result in bone fractures. If you are 19 years old or older, or if you are at risk for osteoporosis and fractures, ask your health care provider if you should: Be screened for bone loss. Take a calcium or vitamin D supplement to lower your risk of fractures. Be given hormone replacement therapy (HRT) to treat symptoms of menopause. Follow these instructions at home: Alcohol use Do not drink alcohol if: Your health care provider tells you not to drink. You are pregnant, may be pregnant, or are planning to become pregnant. If you drink alcohol: Limit how much you have to: 0-1 drink a day. Know how much alcohol is in your drink. In the [place] one drink equals one 12 oz bottle of beer (355 mL), one 5 oz glass of wine (148 mL), or one 1  oz glass of hard liquor (44 mL). Lifestyle Do not use any products that contain nicotine or tobacco. These products include cigarettes, chewing tobacco, and vaping devices, such as e-cigarettes. If you need help quitting, ask your health care provider. Do not use street drugs. Do not share needles. Ask your health care provider for help if you need support or information about quitting drugs. General instructions Schedule regular health, dental, and eye exams. Stay current with your vaccines. Tell your health care provider if: You often feel depressed. You have ever been abused or do not feel safe at home. Summary Adopting a healthy lifestyle and getting preventive care are important in promoting health and wellness. Follow your health care provider's instructions about healthy diet, exercising, and getting tested or screened for diseases. Follow your  health care provider's instructions on monitoring your cholesterol and blood pressure. This information is not intended to replace advice given to you by your health care provider. Make sure you discuss any questions you have with your health care provider. Document Revised: 12/09/2020 Document Reviewed: 12/09/2020 Elsevier Patient Education  2024 [employer].

## 2023-01-20 ENCOUNTER — Other Ambulatory Visit: Payer: Self-pay | Admitting: Family Medicine

## 2023-01-20 DIAGNOSIS — J4541 Moderate persistent asthma with (acute) exacerbation: Secondary | ICD-10-CM

## 2023-01-20 NOTE — Telephone Encounter (Signed)
Myriam Jacobson, patient's daughter called.  She is requesting a 90 day supply of her Trelegy and Albuterol.

## 2023-01-29 IMAGING — DX DG CHEST 2V
2 series · 2 of 2 positions shown · non-contrast
Comparison: None Available.

CLINICAL DATA: Cough for 10 days.  Headache.

EXAM:
CHEST - 2 VIEW

[chest pa]
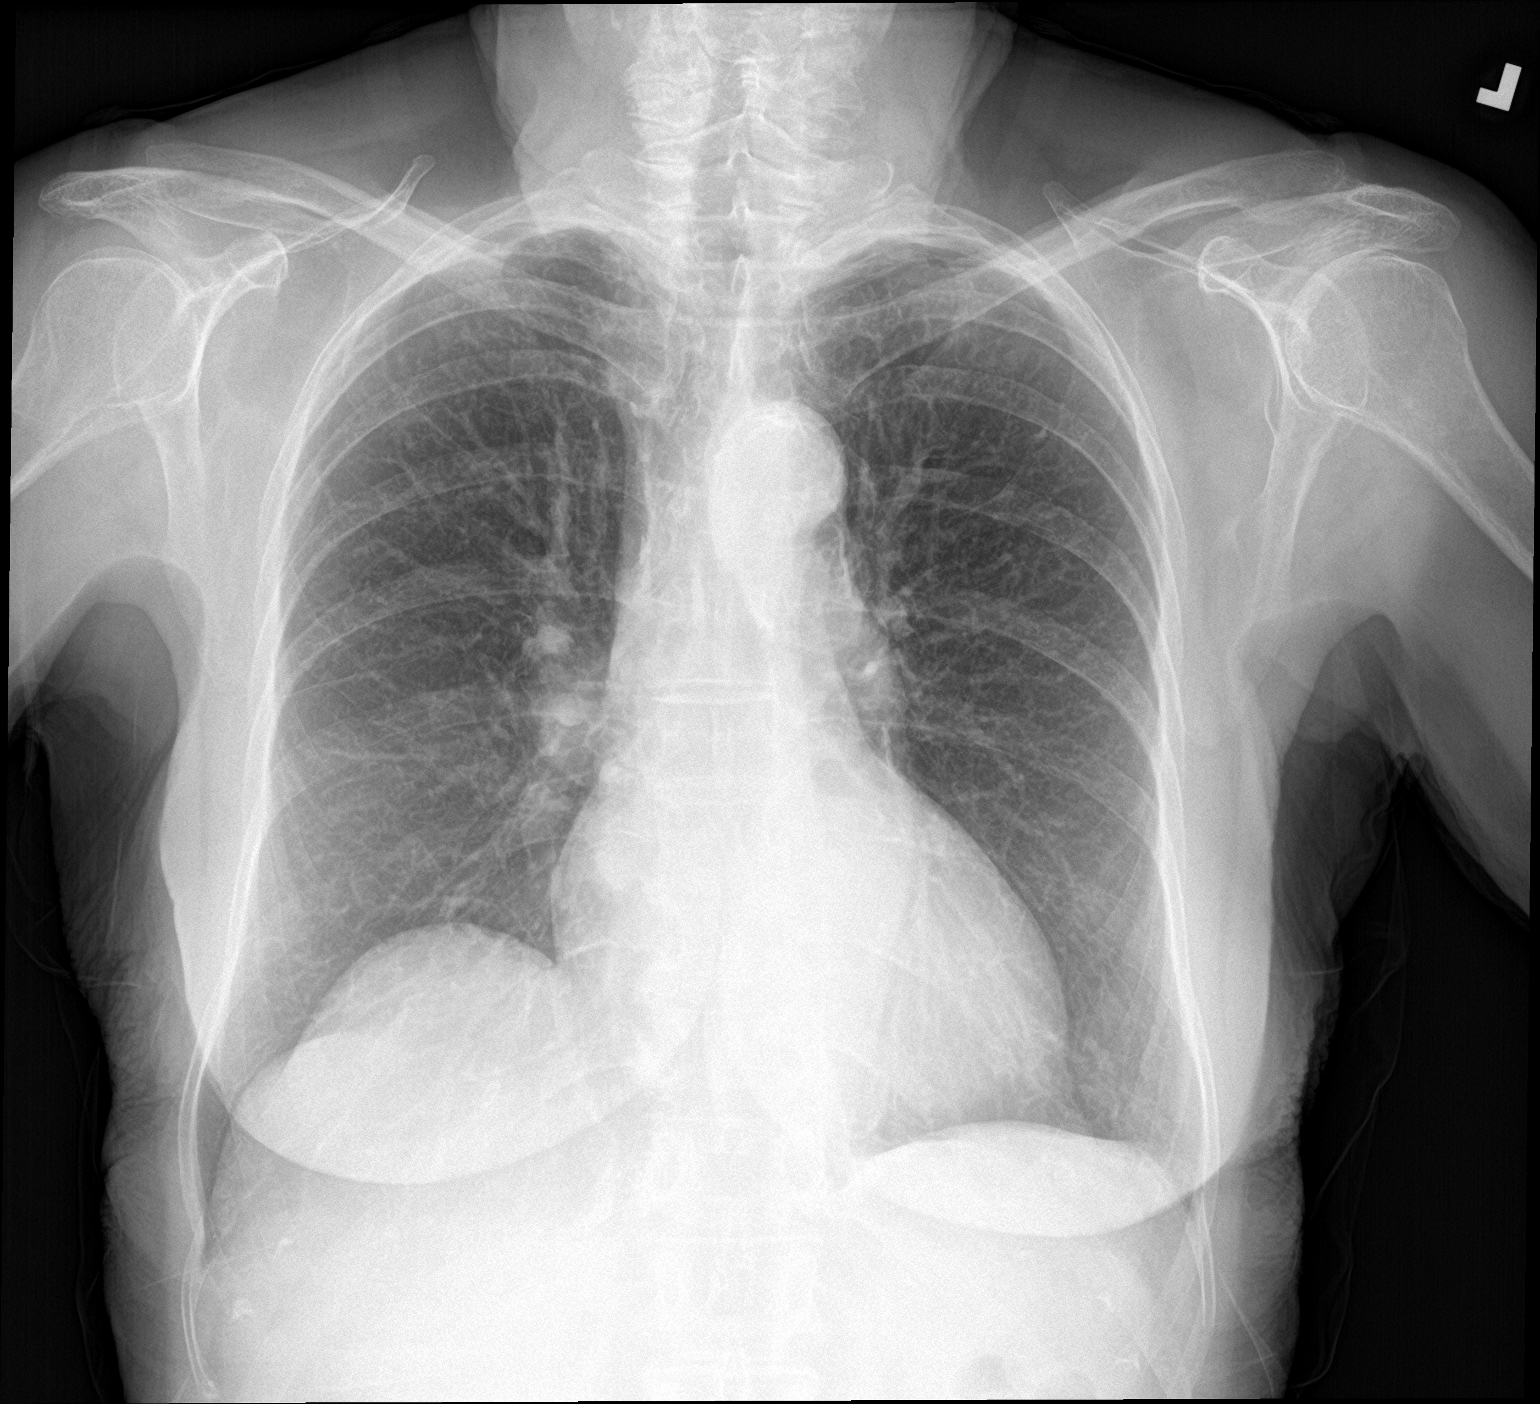

[chest lat]
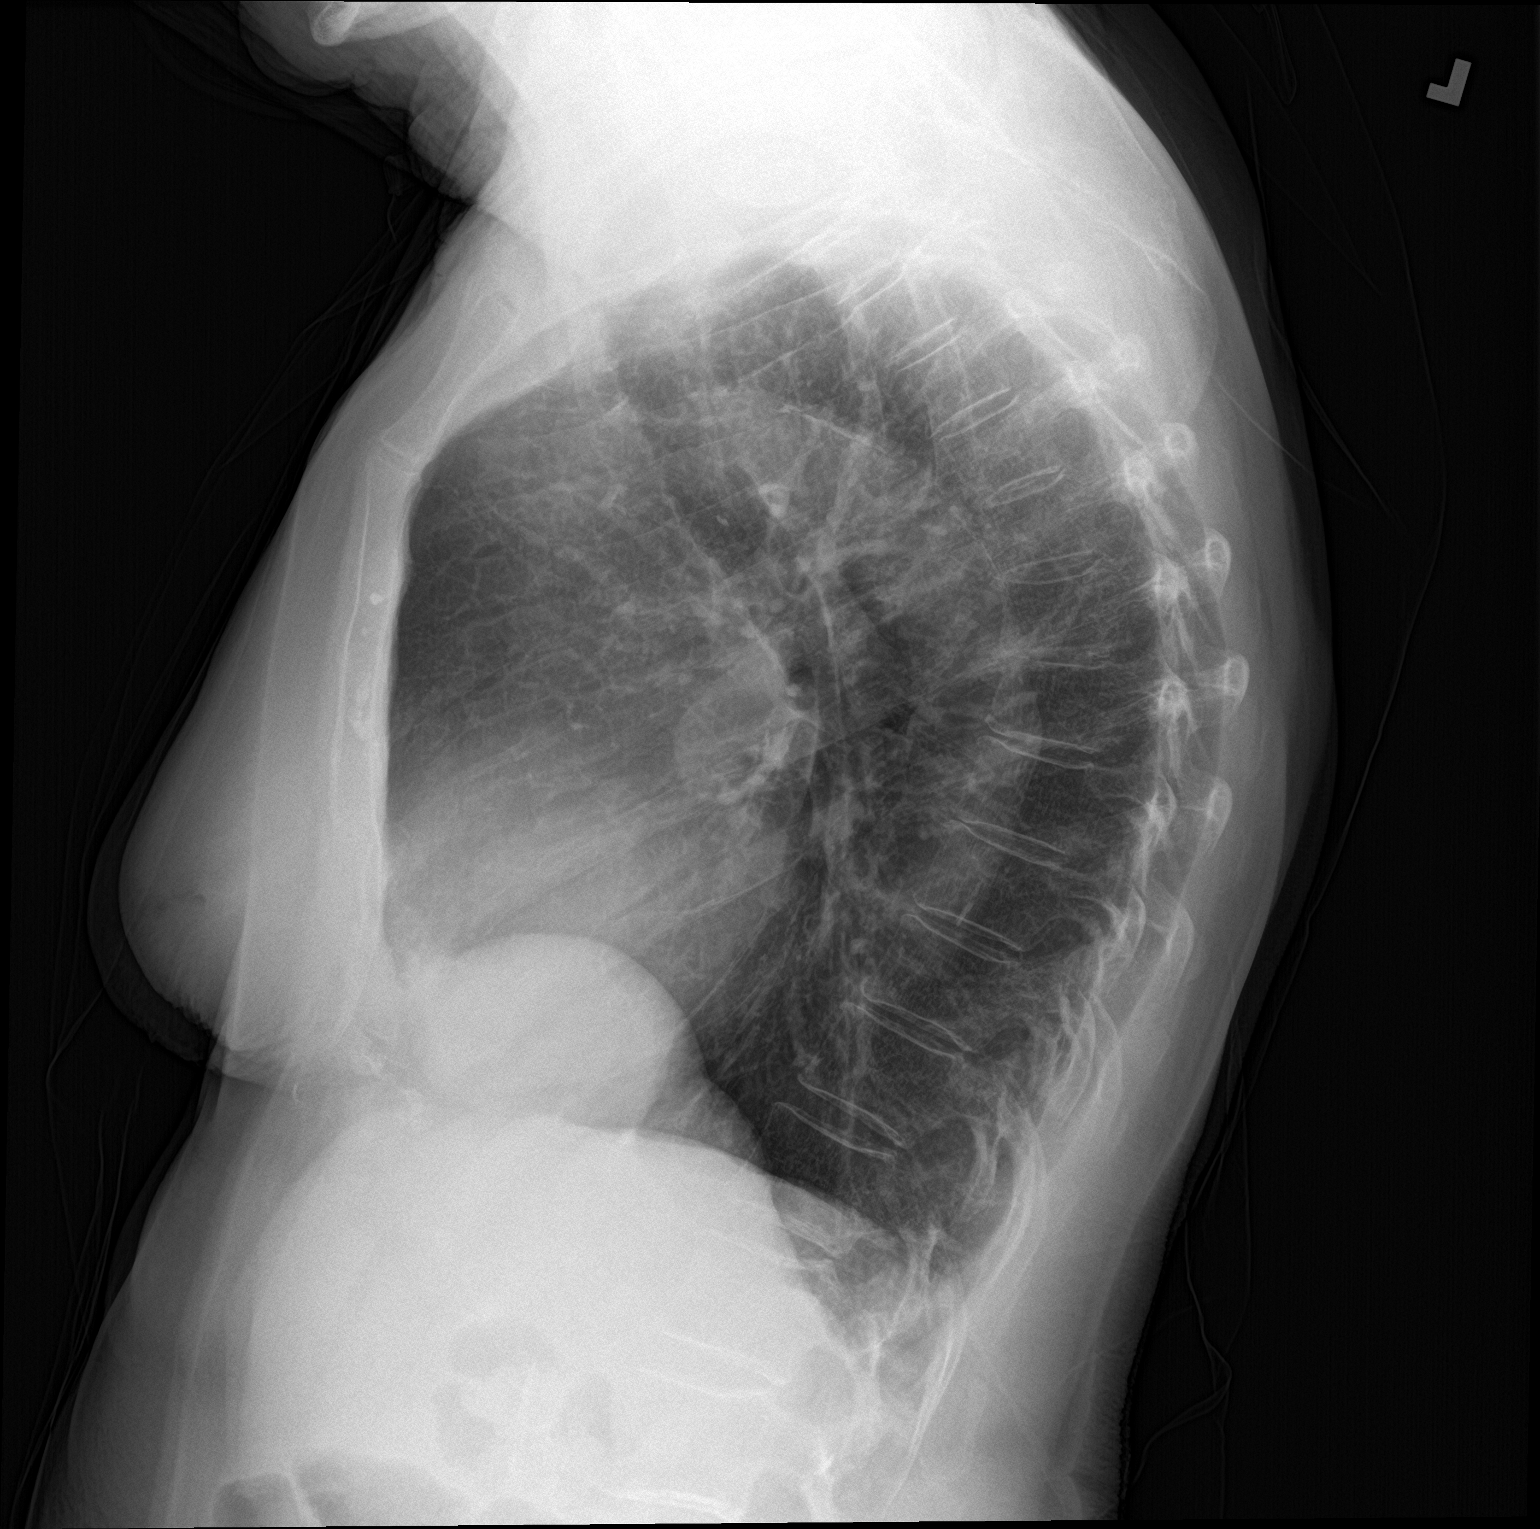

[2 of 2 positions shown; findings below may reference images not displayed]

FINDINGS: The heart size and mediastinal contours are within normal limits.
Both lungs are clear. The visualized skeletal structures are
unremarkable.
IMPRESSION: No active cardiopulmonary disease.

## 2023-02-18 ENCOUNTER — Ambulatory Visit: Payer: 59 | Admitting: Family Medicine

## 2023-02-19 ENCOUNTER — Encounter: Payer: Self-pay | Admitting: Family Medicine

## 2023-02-19 ENCOUNTER — Ambulatory Visit (INDEPENDENT_AMBULATORY_CARE_PROVIDER_SITE_OTHER): Payer: 59 | Admitting: Family Medicine

## 2023-02-19 VITALS — BP 120/70 | HR 76 | Ht 63.0 in | Wt 117.0 lb

## 2023-02-19 DIAGNOSIS — L820 Inflamed seborrheic keratosis: Secondary | ICD-10-CM

## 2023-02-19 DIAGNOSIS — R053 Chronic cough: Secondary | ICD-10-CM | POA: Diagnosis not present

## 2023-02-19 DIAGNOSIS — J4550 Severe persistent asthma, uncomplicated: Secondary | ICD-10-CM | POA: Diagnosis not present

## 2023-02-19 DIAGNOSIS — I75023 Atheroembolism of bilateral lower extremities: Secondary | ICD-10-CM | POA: Diagnosis not present

## 2023-02-19 DIAGNOSIS — R7301 Impaired fasting glucose: Secondary | ICD-10-CM | POA: Diagnosis not present

## 2023-02-19 DIAGNOSIS — N1831 Chronic kidney disease, stage 3a: Secondary | ICD-10-CM

## 2023-02-19 DIAGNOSIS — I10 Essential (primary) hypertension: Secondary | ICD-10-CM

## 2023-02-19 LAB — POCT GLYCOSYLATED HEMOGLOBIN (HGB A1C): Hemoglobin A1C: 5.8 % — AB (ref 4.0–5.6)

## 2023-02-19 MED ORDER — AMLODIPINE BESYLATE 5 MG PO TABS: 5.0000 mg | ORAL_TABLET | Freq: Every day | ORAL | 0 refills | Status: DC

## 2023-02-19 NOTE — Progress Notes (Unsigned)
Established Patient Office Visit  Subjective   Patient ID: Erika Lynch, female    DOB: August 07, 1932  Age: 89 y.o. MRN: [id number]  Chief Complaint  Patient presents with   Hypertension   ifg    HPI  Hypertension- Pt denies chest pain, SOB, dizziness, or heart palpitations.  Taking meds as directed w/o problems.  Denies medication side effects.    Impaired fasting glucose-no increased thirst or urination. No symptoms consistent with hypoglycemia.  2 skin lesion on her nck that catch on clothing and break out.    Injury to the to the top of her right foot a couple of weeks ago. She has been taking care of it.  Something fell on it and cut the skin on the top of the foot.    {History (Optional):23778}  ROS    Objective:     BP 120/70   Pulse 76   Ht 5\' 3"  (1.6 m)   Wt 117 lb (53.1 kg)   SpO2 99%   BMI 20.73 kg/m  {Vitals History (Optional):23777}  Physical Exam Vitals and nursing note reviewed.  Constitutional:      Appearance: She is well-developed.  HENT:     Head: Normocephalic and atraumatic.  Cardiovascular:     Rate and Rhythm: Normal rate and regular rhythm.     Heart sounds: Normal heart sounds.  Pulmonary:     Effort: Pulmonary effort is normal.     Breath sounds: Normal breath sounds.  Skin:    General: Skin is warm and dry.     Comments: 2  sebeorrheic keratoses on her anterior near, near collarbone  Neurological:     Mental Status: She is alert and oriented to person, place, and time.  Psychiatric:        Behavior: Behavior normal.      Results for orders placed or performed in visit on 02/19/23  POCT glycosylated hemoglobin (Hb A1C)  Result Value Ref Range   Hemoglobin A1C 5.8 (A) 4.0 - 5.6 %   HbA1c POC (<> result, manual entry)     HbA1c, POC (prediabetic range)     HbA1c, POC (controlled diabetic range)      {Labs (Optional):23779}  The ASCVD Risk score (Arnett DK, et al., 2019) failed to calculate for the following reasons:    The 2019 ASCVD risk score is only valid for ages 61 to 36   The patient has a prior MI or stroke diagnosis    Assessment & Plan:   Problem List Items Addressed This Visit       Cardiovascular and Mediastinum   Essential hypertension    Well controlled. Continue current regimen. Follow up in  62mo      Relevant Medications   amLODipine (NORVASC) 5 MG tablet   Blue toe syndrome, bilateral (HCC)    Dusky appearing feet. Continue to inc activityStable. Wound is healing really well on the top of her foot.       Relevant Medications   amLODipine (NORVASC) 5 MG tablet     Respiratory   Severe persistent asthmatic bronchitis without complication     Endocrine   IFG (impaired fasting glucose) - Primary   Relevant Orders   POCT glycosylated hemoglobin (Hb A1C) (Completed)   Other Visit Diagnoses     Seborrheic keratoses, inflamed       Chronic cough           Cryotherapy Procedure Note  Pre-operative Diagnosis: {lesions:10351}  Post-operative Diagnosis: {lesions:10351}  Locations: {description:15099} {body part:32401}  Indications: ***  Anesthesia: {GNFAO:13086} {BICARB:16398}  Procedure Details  Patient informed of risks (permanent scarring, infection, light or dark discoloration, bleeding, infection, weakness, numbness and recurrence of the lesion) and benefits of the procedure and verbal informed consent obtained.  The areas are treated with liquid nitrogen therapy, frozen until ice ball extended 1 mm beyond lesion, allowed to thaw, and treated again. The patient tolerated procedure well.  The patient was instructed on post-op care, warned that there may be blister formation, redness and pain. Recommend OTC analgesia as needed for pain.  Condition: Stable  Complications: {complications:13423}.  Plan: 1. Instructed to keep the area dry and covered for 24-48h and clean thereafter. 2. Warning signs of infection were reviewed.   3. Recommended that the patient use  {PAIN MEDS:16413} as needed for pain.  4. Return in {1-10:18281} {time units w/ wo plural:10300}.   Return in about 3 months (around 05/22/2023) for IFG/BP.    [name], MD

## 2023-02-21 NOTE — Assessment & Plan Note (Signed)
Well controlled. Continue current regimen. Follow up in  6 mo  

## 2023-02-21 NOTE — Assessment & Plan Note (Signed)
Dusky appearing feet. Continue to inc activityStable. Wound is healing really well on the top of her foot.

## 2023-02-22 NOTE — Assessment & Plan Note (Signed)
Well controlled. F/U in 6 mo   Lab Results  Component Value Date   HGBA1C 5.8 (A) 02/19/2023

## 2023-02-22 NOTE — Assessment & Plan Note (Signed)
Continue to follow up Q 6 mo

## 2023-02-22 NOTE — Assessment & Plan Note (Signed)
No recent flares. Does have a chronic cough.

## 2023-03-12 ENCOUNTER — Other Ambulatory Visit: Payer: Self-pay | Admitting: *Deleted

## 2023-03-12 DIAGNOSIS — F4329 Adjustment disorder with other symptoms: Secondary | ICD-10-CM

## 2023-03-12 DIAGNOSIS — J4489 Other specified chronic obstructive pulmonary disease: Secondary | ICD-10-CM

## 2023-03-12 DIAGNOSIS — J4541 Moderate persistent asthma with (acute) exacerbation: Secondary | ICD-10-CM

## 2023-03-12 DIAGNOSIS — K2100 Gastro-esophageal reflux disease with esophagitis, without bleeding: Secondary | ICD-10-CM

## 2023-03-12 DIAGNOSIS — I10 Essential (primary) hypertension: Secondary | ICD-10-CM

## 2023-03-12 DIAGNOSIS — E782 Mixed hyperlipidemia: Secondary | ICD-10-CM

## 2023-03-12 DIAGNOSIS — I63412 Cerebral infarction due to embolism of left middle cerebral artery: Secondary | ICD-10-CM

## 2023-03-12 MED ORDER — TRELEGY ELLIPTA 100-62.5-25 MCG/ACT IN AEPB: 1.0000 | INHALATION_SPRAY | Freq: Every day | RESPIRATORY_TRACT | 3 refills | Status: DC

## 2023-03-12 MED ORDER — METOPROLOL SUCCINATE ER 50 MG PO TB24: 50.0000 mg | ORAL_TABLET | Freq: Every day | ORAL | 1 refills | Status: DC

## 2023-03-12 MED ORDER — ALENDRONATE SODIUM 70 MG PO TABS: 70.0000 mg | ORAL_TABLET | ORAL | 3 refills | Status: DC

## 2023-03-12 MED ORDER — ATORVASTATIN CALCIUM 20 MG PO TABS: 20.0000 mg | ORAL_TABLET | Freq: Every day | ORAL | 3 refills | Status: DC

## 2023-03-12 MED ORDER — AMLODIPINE BESYLATE 5 MG PO TABS: 5.0000 mg | ORAL_TABLET | Freq: Every day | ORAL | 3 refills | Status: DC

## 2023-03-12 MED ORDER — CLOPIDOGREL BISULFATE 75 MG PO TABS: 75.0000 mg | ORAL_TABLET | Freq: Every day | ORAL | 3 refills | Status: DC

## 2023-03-12 MED ORDER — DULOXETINE HCL 30 MG PO CPEP: 30.0000 mg | ORAL_CAPSULE | Freq: Every day | ORAL | 3 refills | Status: DC

## 2023-03-12 MED ORDER — PANTOPRAZOLE SODIUM 40 MG PO TBEC: 40.0000 mg | DELAYED_RELEASE_TABLET | Freq: Every day | ORAL | 3 refills | Status: DC

## 2023-03-12 MED ORDER — ALBUTEROL SULFATE HFA 108 (90 BASE) MCG/ACT IN AERS: 2.0000 | INHALATION_SPRAY | Freq: Four times a day (QID) | RESPIRATORY_TRACT | 3 refills | Status: DC | PRN

## 2023-03-14 ENCOUNTER — Other Ambulatory Visit: Payer: Self-pay | Admitting: Family Medicine

## 2023-03-14 DIAGNOSIS — K2100 Gastro-esophageal reflux disease with esophagitis, without bleeding: Secondary | ICD-10-CM

## 2023-03-15 ENCOUNTER — Other Ambulatory Visit: Payer: Self-pay | Admitting: Family Medicine

## 2023-03-15 DIAGNOSIS — I63412 Cerebral infarction due to embolism of left middle cerebral artery: Secondary | ICD-10-CM

## 2023-04-24 ENCOUNTER — Other Ambulatory Visit: Payer: Self-pay | Admitting: Family Medicine

## 2023-04-24 DIAGNOSIS — F4329 Adjustment disorder with other symptoms: Secondary | ICD-10-CM

## 2023-05-04 ENCOUNTER — Other Ambulatory Visit (HOSPITAL_COMMUNITY): Payer: Self-pay

## 2023-05-04 ENCOUNTER — Other Ambulatory Visit: Payer: Self-pay

## 2023-05-04 DIAGNOSIS — E782 Mixed hyperlipidemia: Secondary | ICD-10-CM

## 2023-05-04 MED ORDER — AMLODIPINE BESYLATE 5 MG PO TABS: 5.0000 mg | ORAL_TABLET | Freq: Every day | ORAL | 3 refills | Status: DC

## 2023-05-04 MED ORDER — ATORVASTATIN CALCIUM 20 MG PO TABS: 20.0000 mg | ORAL_TABLET | Freq: Every day | ORAL | 3 refills | Status: DC

## 2023-05-17 ENCOUNTER — Telehealth: Payer: Self-pay | Admitting: Family Medicine

## 2023-05-17 NOTE — Telephone Encounter (Signed)
Daughter called she is requesting refills for her mother Amlodipine 5mg   [facility] Delivery Phone [phone]

## 2023-05-18 NOTE — Telephone Encounter (Signed)
Amlodipine 5 mg was sent to mail order pharmacy on 10/1

## 2023-05-25 ENCOUNTER — Ambulatory Visit (INDEPENDENT_AMBULATORY_CARE_PROVIDER_SITE_OTHER): Payer: 59 | Admitting: Family Medicine

## 2023-05-25 ENCOUNTER — Encounter: Payer: Self-pay | Admitting: Family Medicine

## 2023-05-25 VITALS — BP 133/53 | HR 79 | Ht 63.0 in | Wt 119.0 lb

## 2023-05-25 DIAGNOSIS — R7301 Impaired fasting glucose: Secondary | ICD-10-CM

## 2023-05-25 DIAGNOSIS — R040 Epistaxis: Secondary | ICD-10-CM

## 2023-05-25 DIAGNOSIS — J4489 Other specified chronic obstructive pulmonary disease: Secondary | ICD-10-CM | POA: Diagnosis not present

## 2023-05-25 DIAGNOSIS — R051 Acute cough: Secondary | ICD-10-CM | POA: Diagnosis not present

## 2023-05-25 DIAGNOSIS — H8110 Benign paroxysmal vertigo, unspecified ear: Secondary | ICD-10-CM | POA: Diagnosis not present

## 2023-05-25 DIAGNOSIS — Z23 Encounter for immunization: Secondary | ICD-10-CM

## 2023-05-25 DIAGNOSIS — I10 Essential (primary) hypertension: Secondary | ICD-10-CM | POA: Diagnosis not present

## 2023-05-25 LAB — POCT GLYCOSYLATED HEMOGLOBIN (HGB A1C): Hemoglobin A1C: 5.5 % (ref 4.0–5.6)

## 2023-05-25 MED ORDER — MECLIZINE HCL 25 MG PO TABS: 25.0000 mg | ORAL_TABLET | Freq: Three times a day (TID) | ORAL | 1 refills | Status: DC | PRN

## 2023-05-25 NOTE — Assessment & Plan Note (Signed)
Pressure looks great today.  Continue current regimen. 

## 2023-05-25 NOTE — Patient Instructions (Signed)
Make sure using your Trelegy every day.

## 2023-05-25 NOTE — Assessment & Plan Note (Addendum)
Has had a little bit more shortness of breath in the last couple of days.  No crackles on lung exam.  Pulse ox was reassuring today.  We did discuss that if she continues to have symptoms over the next couple of days please let us know she always has a chronic productive cough that is not new.  Encouraged her to use her Trelegy daily.  Did encourage her to get her flu shot today.

## 2023-05-25 NOTE — Progress Notes (Signed)
Established Patient Office Visit  Subjective   Patient ID: Erika Lynch, female    DOB: Jul 08, 1933  Age: 89 y.o. MRN: [id number]  Chief Complaint  Patient presents with   ifg    HPI  Impaired fasting glucose-no increased thirst or urination. No symptoms consistent with hypoglycemia.  Hypertension- Pt denies chest pain, SOB, dizziness, or heart palpitations.  Taking meds as directed w/o problems.  Denies medication side effects.    She has been more SOB last 2 days.  Cough is chronic but not necessarily different.    She had a nosebleed about 2 weeks.  She was able to get it to stop in about 15 minutes or so.  She is on Plavix she has not had this happen before.  She had been dealing with a lot of allergies with a lot of sneezing and drainage around that time.  Lower extremity swelling mostly well controlled right now. Still occ swelling     ROS    Objective:     BP (!) 133/53   Pulse 79   Ht 5\' 3"  (1.6 m)   Wt 119 lb (54 kg)   SpO2 98%   BMI 21.08 kg/m    Physical Exam Vitals and nursing note reviewed.  Constitutional:      Appearance: Normal appearance.  HENT:     Head: Normocephalic and atraumatic.  Eyes:     Conjunctiva/sclera: Conjunctivae normal.  Cardiovascular:     Rate and Rhythm: Normal rate and regular rhythm.  Pulmonary:     Effort: Pulmonary effort is normal.     Comments: Coarse BS bilaterally  Musculoskeletal:     Comments: Left ankle is a little bit more swollen compared to the right.  She is wearing her compression stockings today.  Skin:    General: Skin is warm and dry.  Neurological:     Mental Status: She is alert.  Psychiatric:        Mood and Affect: Mood normal.      Results for orders placed or performed in visit on 05/25/23  POCT HgB A1C  Result Value Ref Range   Hemoglobin A1C 5.5 4.0 - 5.6 %   HbA1c POC (<> result, manual entry)     HbA1c, POC (prediabetic range)     HbA1c, POC (controlled diabetic range)         The ASCVD Risk score (Arnett DK, et al., 2019) failed to calculate for the following reasons:   The 2019 ASCVD risk score is only valid for ages 91 to 63   The patient has a prior MI or stroke diagnosis    Assessment & Plan:   Problem List Items Addressed This Visit       Cardiovascular and Mediastinum   Essential hypertension    Pressure looks great today.  Continue current regimen.      Relevant Orders   CMP14+EGFR   CBC with Differential/Platelet     Respiratory   COPD with asthma (HCC)    Has had a little bit more shortness of breath in the last couple of days.  No crackles on lung exam.  Pulse ox was reassuring today.  We did discuss that if she continues to have symptoms over the next couple of days please let us know she always has a chronic productive cough that is not new.  Encouraged her to use her Trelegy daily.  Did encourage her to get her flu shot today.  Endocrine   IFG (impaired fasting glucose) - Primary   Relevant Orders   POCT HgB A1C (Completed)   CMP14+EGFR   CBC with Differential/Platelet     Nervous and Auditory   BPPV (benign paroxysmal positional vertigo)   Relevant Medications   meclizine (ANTIVERT) 25 MG tablet   Other Relevant Orders   CMP14+EGFR   CBC with Differential/Platelet   Other Visit Diagnoses     Acute cough       Relevant Orders   CMP14+EGFR   CBC with Differential/Platelet   Encounter for immunization       Relevant Orders   Flu Vaccine Trivalent High Dose (Fluad) (Completed)   Nosebleed          Recent nosebleed-we discussed some strategies around slowing the bleeding down if it occurs again.  She is on Plavix which can increase the risk.  Call if any problems or concerns.  No follow-ups on file.    [name], MD

## 2023-05-26 LAB — CMP14+EGFR
ALT: 16 [IU]/L (ref 0–32)
AST: 23 [IU]/L (ref 0–40)
Albumin: 4.2 g/dL (ref 3.7–4.7)
Alkaline Phosphatase: 55 [IU]/L (ref 44–121)
BUN/Creatinine Ratio: 20 (ref 12–28)
BUN: 20 mg/dL (ref 8–27)
Bilirubin Total: 0.3 mg/dL (ref 0.0–1.2)
CO2: 21 mmol/L (ref 20–29)
Calcium: 9.9 mg/dL (ref 8.7–10.3)
Chloride: 102 mmol/L (ref 96–106)
Creatinine, Ser: 0.99 mg/dL (ref 0.57–1.00)
Globulin, Total: 2.4 g/dL (ref 1.5–4.5)
Glucose: 135 mg/dL — ABNORMAL HIGH (ref 70–99)
Potassium: 3.9 mmol/L (ref 3.5–5.2)
Sodium: 140 mmol/L (ref 134–144)
Total Protein: 6.6 g/dL (ref 6.0–8.5)
eGFR: 55 mL/min/{1.73_m2} — ABNORMAL LOW (ref >59)

## 2023-05-26 LAB — CBC WITH DIFFERENTIAL/PLATELET
Basophils Absolute: 0.1 10*3/uL (ref 0.0–0.2)
Basos: 1 %
EOS (ABSOLUTE): 1.1 10*3/uL — ABNORMAL HIGH (ref 0.0–0.4)
Eos: 12 %
Hematocrit: 42.3 % (ref 34.0–46.6)
Hemoglobin: 14 g/dL (ref 11.1–15.9)
Immature Grans (Abs): 0 10*3/uL (ref 0.0–0.1)
Immature Granulocytes: 0 %
Lymphocytes Absolute: 3.4 10*3/uL — ABNORMAL HIGH (ref 0.7–3.1)
Lymphs: 36 %
MCH: 32.5 pg (ref 26.6–33.0)
MCHC: 33.1 g/dL (ref 31.5–35.7)
MCV: 98 fL — ABNORMAL HIGH (ref 79–97)
Monocytes Absolute: 0.6 10*3/uL (ref 0.1–0.9)
Monocytes: 6 %
Neutrophils Absolute: 4.2 10*3/uL (ref 1.4–7.0)
Neutrophils: 45 %
Platelets: 301 10*3/uL (ref 150–450)
RBC: 4.31 x10E6/uL (ref 3.77–5.28)
RDW: 12.2 % (ref 11.7–15.4)
WBC: 9.5 10*3/uL (ref 3.4–10.8)

## 2023-05-27 ENCOUNTER — Telehealth: Payer: Self-pay | Admitting: Family Medicine

## 2023-05-27 DIAGNOSIS — J4489 Other specified chronic obstructive pulmonary disease: Secondary | ICD-10-CM

## 2023-05-27 MED ORDER — CEFDINIR 300 MG PO CAPS
300.0000 mg | ORAL_CAPSULE | Freq: Two times a day (BID) | ORAL | 0 refills | Status: DC
Start: 2023-05-27 — End: 2023-08-31

## 2023-05-27 MED ORDER — PREDNISONE 20 MG PO TABS: 40.0000 mg | ORAL_TABLET | Freq: Every day | ORAL | 0 refills | Status: DC

## 2023-05-27 NOTE — Progress Notes (Signed)
Hi Moe, kidney function is stable.  The ALT liver enzyme which jumped up a little bit last time is back down to normal which is great.  Blood count looks OK.

## 2023-05-27 NOTE — Telephone Encounter (Signed)
Will fwd to pcp for advice.

## 2023-05-27 NOTE — Telephone Encounter (Signed)
Erika Lynch patient's daughter called patient seen this week she is still coughing not any better Please advise

## 2023-05-27 NOTE — Telephone Encounter (Signed)
Meds ordered this encounter  Medications   cefdinir (OMNICEF) 300 MG capsule    Sig: Take 1 capsule (300 mg total) by mouth 2 (two) times daily.    Dispense:  10 capsule    Refill:  0   predniSONE (DELTASONE) 20 MG tablet    Sig: Take 2 tablets (40 mg total) by mouth daily with breakfast.    Dispense:  10 tablet    Refill:  0

## 2023-06-01 ENCOUNTER — Other Ambulatory Visit: Payer: Self-pay | Admitting: Family Medicine

## 2023-06-01 DIAGNOSIS — I10 Essential (primary) hypertension: Secondary | ICD-10-CM

## 2023-08-30 ENCOUNTER — Ambulatory Visit: Payer: Self-pay | Admitting: Family Medicine

## 2023-08-30 NOTE — Telephone Encounter (Signed)
Patient is scheduled tomorrow with provider at 1130 am.

## 2023-08-30 NOTE — Telephone Encounter (Signed)
  Chief Complaint: cough and dizziness Symptoms: congestion, runny nose Frequency: comes and goes  Disposition: [] ED /[] Urgent Care (no appt availability in office) / [x] Appointment(In office/virtual)/ []  [facility] [facility]/ [] Home Care/ [] Refused Recommended Disposition /[] [facility]/ []  Follow-up with PCP Additional Notes: Pt's daughter Myriam Jacobson called with concerns of cough worsening and dizzy spells. Pt has vertigo and it is unclear if that is causing the dizziness or if the cold like symptoms are causing the balance issues. Cough can be productive at times and clear to yellow in color. Pt using delsym and humidifier for a couple of weeks now, but feels cough is getting worse.Per protocol, to be seen within 3 days. Pt has appt tomorrow with PCP. RN gave care advice and Myriam Jacobson verbalized understanding.                Copied from CRM [id number]. Topic: Clinical - Red Word Triage >> Aug 30, 2023  2:01 PM Desma Mcgregor wrote: Red Word that prompted transfer to Nurse Triage: Dizziness and unusual headaches. OTC meds not helping. Has some rattling in chest and coughing. May be vertigo returning and she might have fluid in ears. Reason for Disposition  Cough has been present for > 3 weeks  Answer Assessment - Initial Assessment Questions 1. ONSET: "When did the cough begin?"     About 2 months worse recently  2. SEVERITY: "How bad is the cough today?"      Deep, wet cough 3. SPUTUM: "Describe the color of your sputum" (none, dry cough; clear, white, yellow, green)     Clear to yellow 4. HEMOPTYSIS: "Are you coughing up any blood?" If so ask: "How much?" (flecks, streaks, tablespoons, etc.)     na 5. DIFFICULTY BREATHING: "Are you having difficulty breathing?" If Yes, ask: "How bad is it?" (e.g., mild, moderate, severe)    - MILD: No SOB at rest, mild SOB with walking, speaks normally in sentences, can lie down, no retractions, pulse < 100.    - MODERATE: SOB at rest, SOB with  minimal exertion and prefers to sit, cannot lie down flat, speaks in phrases, mild retractions, audible wheezing, pulse 100-120.    - SEVERE: Very SOB at rest, speaks in single words, struggling to breathe, sitting hunched forward, retractions, pulse > 120      Moderate-talking on phone after a few minutes of talking 6. FEVER: "Do you have a fever?" If Yes, ask: "What is your temperature, how was it measured, and when did it start?"     Denies  7. CARDIAC HISTORY: "Do you have any history of heart disease?" (e.g., heart attack, congestive heart failure)      Stroke 2 years ago  8. LUNG HISTORY: "Do you have any history of lung disease?"  (e.g., pulmonary embolus, asthma, emphysema)     COPD, emphysema, asthma 9. PE RISK FACTORS: "Do you have a history of blood clots?" (or: recent major surgery, recent prolonged travel, bedridden)     Yes 2019 10. OTHER SYMPTOMS: "Do you have any other symptoms?" (e.g., runny nose, wheezing, chest pain)       Dizziness, sneezing  12. TRAVEL: "Have you traveled out of the country in the last month?" (e.g., travel history, exposures)       no  Protocols used: Cough - Acute Productive-A-AH

## 2023-08-31 ENCOUNTER — Ambulatory Visit (INDEPENDENT_AMBULATORY_CARE_PROVIDER_SITE_OTHER): Payer: 59 | Admitting: Family Medicine

## 2023-08-31 VITALS — BP 128/68 | HR 74 | Ht 63.0 in | Wt 119.0 lb

## 2023-08-31 DIAGNOSIS — J4541 Moderate persistent asthma with (acute) exacerbation: Secondary | ICD-10-CM

## 2023-08-31 DIAGNOSIS — H8110 Benign paroxysmal vertigo, unspecified ear: Secondary | ICD-10-CM | POA: Diagnosis not present

## 2023-08-31 DIAGNOSIS — R0602 Shortness of breath: Secondary | ICD-10-CM | POA: Diagnosis not present

## 2023-08-31 DIAGNOSIS — R519 Headache, unspecified: Secondary | ICD-10-CM

## 2023-08-31 DIAGNOSIS — R052 Subacute cough: Secondary | ICD-10-CM | POA: Diagnosis not present

## 2023-08-31 DIAGNOSIS — J441 Chronic obstructive pulmonary disease with (acute) exacerbation: Secondary | ICD-10-CM | POA: Diagnosis not present

## 2023-08-31 LAB — POC COVID19 BINAXNOW: SARS Coronavirus 2 Ag: NEGATIVE

## 2023-08-31 MED ORDER — IPRATROPIUM-ALBUTEROL 0.5-2.5 (3) MG/3ML IN SOLN: 3.0000 mL | RESPIRATORY_TRACT | 6 refills | Status: DC | PRN

## 2023-08-31 MED ORDER — DOXYCYCLINE HYCLATE 100 MG PO TABS: 100.0000 mg | ORAL_TABLET | Freq: Two times a day (BID) | ORAL | 0 refills | Status: DC

## 2023-08-31 MED ORDER — IPRATROPIUM-ALBUTEROL 0.5-2.5 (3) MG/3ML IN SOLN
3.0000 mL | Freq: Once | RESPIRATORY_TRACT | Status: AC
  Administered 2023-08-31: 3 mL via RESPIRATORY_TRACT

## 2023-08-31 MED ORDER — PREDNISONE 20 MG PO TABS: 40.0000 mg | ORAL_TABLET | Freq: Every day | ORAL | 0 refills | Status: DC

## 2023-08-31 MED ORDER — FLUTICASONE PROPIONATE 50 MCG/ACT NA SUSP: NASAL | 1 refills | Status: DC

## 2023-08-31 NOTE — Progress Notes (Signed)
Acute Office Visit  Subjective:     Patient ID: Erika Lynch, female    DOB: [birth year], 89 y.o.   MRN: [id number]  Chief Complaint  Patient presents with   Cough   Dizziness    HPI  Patient is in today for sob, lightheaded and dizziness, and cough.  She says that about 3 to 4 days ago she started having an increase in cough sputum production shortness of breath and mild sore throat.  No fevers chills or sweats.  No ear pain.  She has also been having a little bit more symptoms with her vertigo for maybe about a week on and off she is actually been taking the Antivert tabs and that has been helping some . She also notes that she has been struggling with more headaches than usual that is been going on for a couple of weeks so well before she started not feeling well to the point where Tylenol actually was not providing any relief and normally it does for her she is not normally 1 to get frequent headaches.  ROS      Objective:    BP 128/68   Pulse 74   Ht 5\' 3"  (1.6 m)   Wt 119 lb (54 kg)   SpO2 100%   BMI 21.08 kg/m    Physical Exam Constitutional:      Appearance: Normal appearance.  HENT:     Head: Normocephalic and atraumatic.     Right Ear: Tympanic membrane, ear canal and external ear normal. There is no impacted cerumen.     Left Ear: Tympanic membrane, ear canal and external ear normal. There is no impacted cerumen.     Nose: Nose normal.     Mouth/Throat:     Pharynx: Oropharynx is clear.  Eyes:     Conjunctiva/sclera: Conjunctivae normal.  Cardiovascular:     Rate and Rhythm: Normal rate and regular rhythm.  Pulmonary:     Effort: Pulmonary effort is normal.     Breath sounds: Normal breath sounds.     Comments: Coarse BS bilat. Morrie Sheldon she had wheezing but after albuterol treatment and several deep breaths that improved. Musculoskeletal:     Cervical back: Neck supple. No tenderness.  Lymphadenopathy:     Cervical: No cervical adenopathy.   Skin:    General: Skin is warm and dry.  Neurological:     Mental Status: She is alert and oriented to person, place, and time.  Psychiatric:        Mood and Affect: Mood normal.     Results for orders placed or performed in visit on 08/31/23  POC COVID-19  Result Value Ref Range   SARS Coronavirus 2 Ag Negative Negative        Assessment & Plan:   Problem List Items Addressed This Visit       Respiratory   Moderate persistent asthma with acute exacerbation   Relevant Medications   ipratropium-albuterol (DUONEB) 0.5-2.5 (3) MG/3ML SOLN   predniSONE (DELTASONE) 20 MG tablet   COPD exacerbation (HCC)   Relevant Medications   ipratropium-albuterol (DUONEB) 0.5-2.5 (3) MG/3ML SOLN   predniSONE (DELTASONE) 20 MG tablet   fluticasone (FLONASE) 50 MCG/ACT nasal spray   Other Relevant Orders   CBC with Differential/Platelet   CMP14+EGFR     Nervous and Auditory   BPPV (benign paroxysmal positional vertigo)   Fortunately her vertigo has triggered again as well as some frequent headaches.  She is taking Antivert which helps  some.  We can do some additional labs because of the headaches.  Blood pressure is also a little elevated today but we will recheck before she leaves.  Could be contributing to headaches as well.        Other   SOB (shortness of breath) - Primary   Relevant Orders   CBC with Differential/Platelet   CMP14+EGFR   POC COVID-19 (Completed)   Other Visit Diagnoses       Subacute cough       Relevant Orders   CBC with Differential/Platelet   CMP14+EGFR     Frequent headaches       Relevant Orders   CBC with Differential/Platelet   CMP14+EGFR      COPD exacerbation-we did give her a nebulizer treatment here in the office and she did feel better.  Encouraged her to use her albuterol nebulizer at home 3 times a day with breakfast lunch and dinner for the next several days until she is starting to feel better and then taper down over several days.   Continue with daily Trelegy.  Will send over a course of prednisone.  Frequent headaches-we discussed that if not improving with antibiotics and prednisone if headaches continue then I would like to get some up-to-date labs.    Meds ordered this encounter  Medications   ipratropium-albuterol (DUONEB) 0.5-2.5 (3) MG/3ML nebulizer solution 3 mL   DISCONTD: predniSONE (DELTASONE) 20 MG tablet    Sig: Take 2 tablets (40 mg total) by mouth daily with breakfast.    Dispense:  10 tablet    Refill:  0   ipratropium-albuterol (DUONEB) 0.5-2.5 (3) MG/3ML SOLN    Sig: Take 3 mLs by nebulization every 4 (four) hours as needed (wheeze, SOB).    Dispense:  90 mL    Refill:  6   doxycycline (VIBRA-TABS) 100 MG tablet    Sig: Take 1 tablet (100 mg total) by mouth 2 (two) times daily.    Dispense:  14 tablet    Refill:  0   predniSONE (DELTASONE) 20 MG tablet    Sig: Take 2 tablets (40 mg total) by mouth daily with breakfast.    Dispense:  10 tablet    Refill:  0   fluticasone (FLONASE) 50 MCG/ACT nasal spray    Sig: USE 1 SPRAY INTO BOTH NOSTRILS DAILY    Dispense:  32 g    Refill:  1    No follow-ups on file.  [name], MD

## 2023-08-31 NOTE — Assessment & Plan Note (Signed)
Fortunately her vertigo has triggered again as well as some frequent headaches.  She is taking Antivert which helps some.  We can do some additional labs because of the headaches.  Blood pressure is also a little elevated today but we will recheck before she leaves.  Could be contributing to headaches as well.

## 2023-10-11 ENCOUNTER — Other Ambulatory Visit: Payer: Self-pay | Admitting: Family Medicine

## 2023-10-11 DIAGNOSIS — I63412 Cerebral infarction due to embolism of left middle cerebral artery: Secondary | ICD-10-CM

## 2023-10-11 DIAGNOSIS — K2100 Gastro-esophageal reflux disease with esophagitis, without bleeding: Secondary | ICD-10-CM

## 2023-10-25 ENCOUNTER — Ambulatory Visit: Payer: Self-pay

## 2023-10-25 NOTE — Telephone Encounter (Signed)
 Information obtained from the daughter Myriam Jacobson.  Chief Complaint: SOB, cough Symptoms: SOB, productive cough, runny nose after being outdoors Frequency: about 2 months Pertinent Negatives: Patient denies fever, CP,  Disposition: [x] ED /[] Urgent Care (no appt availability in office) / [] Appointment(In office/virtual)/ []  [facility]/ [] Home Care/ [x] Refused Recommended Disposition /[] [facility]/ []  Follow-up with PCP Additional Notes: Daughter states that she saw the PCP a few months ago for respiratory virus. Daughter states that she was doing better. Daughter states that she is coughing again. Pt does not have SPO2 monitor. Daughter reports that pt gets SOB and speaks in phrases during phone call with pt. Daughter states that she can also hear the pt breathing when talking on the phone. Caller is not able to check pulse rate as she is not with the pt. Daughter advised that per protocol pt should go to the ED. Daughter states that pt will not go to the ED. Daughter requesting appt in clinic.  Copied from CRM [id number]. Topic: Clinical - Red Word Triage >> Oct 25, 2023  1:39 PM Geroge Baseman wrote: Red Word that prompted transfer to Nurse Triage: Patient has not been feeling better, coughing wheezing, and very short of breath. Talking causes shortness of breath and coughing fits. Reason for Disposition  [1] MODERATE difficulty breathing (e.g., speaks in phrases, SOB even at rest, pulse 100-120) AND [2] still present when not coughing  Answer Assessment - Initial Assessment Questions 1. ONSET: "When did the cough begin?"      About 2 months ago 2. SEVERITY: "How bad is the cough today?"      moderate 3. SPUTUM: "Describe the color of your sputum" (none, dry cough; clear, white, yellow, green)     yellow 4. HEMOPTYSIS: "Are you coughing up any blood?" If so ask: "How much?" (flecks, streaks, tablespoons, etc.)     denies 5. DIFFICULTY BREATHING: "Are you having difficulty  breathing?" If Yes, ask: "How bad is it?" (e.g., mild, moderate, severe)    - MILD: No SOB at rest, mild SOB with walking, speaks normally in sentences, can lie down, no retractions, pulse < 100.    - MODERATE: SOB at rest, SOB with minimal exertion and prefers to sit, cannot lie down flat, speaks in phrases, mild retractions, audible wheezing, pulse 100-120.    - SEVERE: Very SOB at rest, speaks in single words, struggling to breathe, sitting hunched forward, retractions, pulse > 120      Moderate per daughter, per pt she "is not more short of breath than normal" 6. FEVER: "Do you have a fever?" If Yes, ask: "What is your temperature, how was it measured, and when did it start?"     denies 7. CARDIAC HISTORY: "Do you have any history of heart disease?" (e.g., heart attack, congestive heart failure)      denies 8. LUNG HISTORY: "Do you have any history of lung disease?"  (e.g., pulmonary embolus, asthma, emphysema)     asthma 9. PE RISK FACTORS: "Do you have a history of blood clots?" (or: recent major surgery, recent prolonged travel, bedridden)     denies 10. OTHER SYMPTOMS: "Do you have any other symptoms?" (e.g., runny nose, wheezing, chest pain)       Periodic runny nose after being outside. 12. TRAVEL: "Have you traveled out of the country in the last month?" (e.g., travel history, exposures)       denies  Protocols used: Cough - Acute Productive-A-AH

## 2023-10-26 NOTE — Telephone Encounter (Signed)
 Patient scheduled for tomorrow at 11:30 with Dr. Linford Arnold

## 2023-10-27 ENCOUNTER — Encounter: Payer: Self-pay | Admitting: Family Medicine

## 2023-10-27 ENCOUNTER — Ambulatory Visit (INDEPENDENT_AMBULATORY_CARE_PROVIDER_SITE_OTHER): Admitting: Family Medicine

## 2023-10-27 ENCOUNTER — Ambulatory Visit (INDEPENDENT_AMBULATORY_CARE_PROVIDER_SITE_OTHER)

## 2023-10-27 VITALS — BP 125/67 | HR 72 | Ht 63.0 in | Wt 122.0 lb

## 2023-10-27 DIAGNOSIS — R052 Subacute cough: Secondary | ICD-10-CM

## 2023-10-27 DIAGNOSIS — B9689 Other specified bacterial agents as the cause of diseases classified elsewhere: Secondary | ICD-10-CM | POA: Diagnosis not present

## 2023-10-27 DIAGNOSIS — J208 Acute bronchitis due to other specified organisms: Secondary | ICD-10-CM | POA: Diagnosis not present

## 2023-10-27 DIAGNOSIS — I700 Atherosclerosis of aorta: Secondary | ICD-10-CM | POA: Diagnosis not present

## 2023-10-27 DIAGNOSIS — J441 Chronic obstructive pulmonary disease with (acute) exacerbation: Secondary | ICD-10-CM | POA: Diagnosis not present

## 2023-10-27 DIAGNOSIS — R059 Cough, unspecified: Secondary | ICD-10-CM | POA: Diagnosis not present

## 2023-10-27 MED ORDER — PREDNISONE 20 MG PO TABS: ORAL_TABLET | ORAL | 0 refills | Status: AC

## 2023-10-27 MED ORDER — DOXYCYCLINE HYCLATE 100 MG PO TABS: 100.0000 mg | ORAL_TABLET | Freq: Two times a day (BID) | ORAL | 0 refills | Status: DC

## 2023-10-27 NOTE — Addendum Note (Signed)
 Addended by: [name] D on: 10/27/2023 02:03 PM   Modules accepted: Orders

## 2023-10-27 NOTE — Progress Notes (Signed)
 HI Erika Lynch no sign of pneumonia on the chest x-ray nothing else concerning which is great.  So going to put you on doxycycline to treat for bacterial bronchitis.

## 2023-10-27 NOTE — Progress Notes (Signed)
 Pt reports that after her last OV in January she finished the ABX and prednisone and did well for a little while. She stated that the coughing would stop and start and now the coughing has started back up. She has recently coughed up some yellow mucus. She denies any f/s/c.

## 2023-10-27 NOTE — Progress Notes (Addendum)
 Acute Office Visit  Subjective:     Patient ID: Erika Lynch, female    DOB: [birth year], 89 y.o.   MRN: [id number]  Chief Complaint  Patient presents with   COPD    HPI Patient is in today for Cough x 2 months.  Seen in jan fo rCOPD exacerbation.  We treated her with Doxy prednisone and she started bumping up your nebulizer treatments to 4 times a day.  She said she felt better for maybe a week maybe a little bit longer and then the cough started back.  She does not feel like she has any significant sinus congestion though she did have a lot of drainage when she was sick back in January.  She says the cough feels like it is deep in her chest and she is not getting some yellow sputum.  She still using her nebulizer twice a day and still using her Trelegy daily.  No fever or chills.  No recurrent sinus symptoms.  ROS      Objective:    BP 125/67   Pulse 72   Ht 5\' 3"  (1.6 m)   Wt 122 lb (55.3 kg)   SpO2 98%   BMI 21.61 kg/m    Physical Exam Vitals and nursing note reviewed.  Constitutional:      Appearance: Normal appearance.  HENT:     Head: Normocephalic and atraumatic.  Eyes:     Conjunctiva/sclera: Conjunctivae normal.  Cardiovascular:     Rate and Rhythm: Normal rate and regular rhythm.  Pulmonary:     Effort: Pulmonary effort is normal.     Breath sounds: Rhonchi present.     Comments: Diffuse  rhonchi with no wheezing. Skin:    General: Skin is warm and dry.  Neurological:     Mental Status: She is alert.  Psychiatric:        Mood and Affect: Mood normal.     No results found for any visits on 10/27/23.      Assessment & Plan:   Problem List Items Addressed This Visit       Respiratory   COPD exacerbation (HCC) - Primary   Relevant Medications   predniSONE (DELTASONE) 20 MG tablet   Other Relevant Orders   DG Chest 2 View (Completed)   Other Visit Diagnoses       Subacute cough       Relevant Orders   DG Chest 2 View (Completed)      Acute bronchitis, bacterial           COPD exacerbation with cough x 2 months-we will go ahead and get chest x-ray for further workup today we will call with results once available, treat with a round of prednisone and maybe do a little bit longer extension.  Will likely add an antibiotic but again I want to get the chest x-ray back and just rule out any other on the Enterline cause such as mass or effusion etc.  No sign of volume overload on exam today.  No chest pain.  She has diffuse rhonchi but no wheezing.  Meds ordered this encounter  Medications   predniSONE (DELTASONE) 20 MG tablet    Sig: Take 2 tablets (40 mg total) by mouth daily with breakfast for 4 days, THEN 1 tablet (20 mg total) daily with breakfast for 4 days, THEN 0.5 tablets (10 mg total) daily with breakfast for 4 days.    Dispense:  14 tablet    Refill:  0   doxycycline (VIBRA-TABS) 100 MG tablet    Sig: Take 1 tablet (100 mg total) by mouth 2 (two) times daily.    Dispense:  14 tablet    Refill:  0    No follow-ups on file.  [name], MD

## 2023-11-18 ENCOUNTER — Other Ambulatory Visit: Payer: Self-pay | Admitting: *Deleted

## 2023-11-18 MED ORDER — FLUTICASONE PROPIONATE 50 MCG/ACT NA SUSP: NASAL | 3 refills | Status: AC

## 2023-11-23 ENCOUNTER — Encounter: Payer: Self-pay | Admitting: Family Medicine

## 2023-11-23 ENCOUNTER — Ambulatory Visit (INDEPENDENT_AMBULATORY_CARE_PROVIDER_SITE_OTHER): Payer: 59 | Admitting: Family Medicine

## 2023-11-23 ENCOUNTER — Telehealth: Payer: Self-pay | Admitting: Family Medicine

## 2023-11-23 VITALS — BP 111/66 | HR 78 | Ht 63.0 in | Wt 124.0 lb

## 2023-11-23 DIAGNOSIS — R7301 Impaired fasting glucose: Secondary | ICD-10-CM

## 2023-11-23 DIAGNOSIS — R052 Subacute cough: Secondary | ICD-10-CM | POA: Diagnosis not present

## 2023-11-23 DIAGNOSIS — J4489 Other specified chronic obstructive pulmonary disease: Secondary | ICD-10-CM

## 2023-11-23 DIAGNOSIS — R0602 Shortness of breath: Secondary | ICD-10-CM | POA: Diagnosis not present

## 2023-11-23 DIAGNOSIS — M7989 Other specified soft tissue disorders: Secondary | ICD-10-CM

## 2023-11-23 DIAGNOSIS — I10 Essential (primary) hypertension: Secondary | ICD-10-CM | POA: Diagnosis not present

## 2023-11-23 DIAGNOSIS — J441 Chronic obstructive pulmonary disease with (acute) exacerbation: Secondary | ICD-10-CM | POA: Diagnosis not present

## 2023-11-23 DIAGNOSIS — R519 Headache, unspecified: Secondary | ICD-10-CM | POA: Diagnosis not present

## 2023-11-23 LAB — POCT GLYCOSYLATED HEMOGLOBIN (HGB A1C): Hemoglobin A1C: 5.7 % — AB (ref 4.0–5.6)

## 2023-11-23 MED ORDER — FUROSEMIDE 20 MG PO TABS: 20.0000 mg | ORAL_TABLET | Freq: Every day | ORAL | 1 refills | Status: DC | PRN

## 2023-11-23 MED ORDER — AZITHROMYCIN 250 MG PO TABS: 500.0000 mg | ORAL_TABLET | ORAL | 1 refills | Status: DC

## 2023-11-23 NOTE — Addendum Note (Signed)
 Addended by: Darnella Zeiter D on: 11/23/2023 06:22 PM   Modules accepted: Orders

## 2023-11-23 NOTE — Progress Notes (Signed)
 Established Patient Office Visit  Subjective  Patient ID: Erika Lynch, female    DOB: 1933/02/11  Age: 89 y.o. MRN: [id number]  Chief Complaint  Patient presents with   ifg    HPI  Here today for routine follow-up for hypertension and COPD.  She actually did have a flare/exacerbation in March with her breathing.  Impaired fasting glucose-no increased thirst or urination. No symptoms consistent with hypoglycemia.  She is up 5 lbs since Jan. Left ankle and foot swollen again, not new.  This has been swelling on and off for several years at this point in fact we had actually evaluated her for a DVT probably 7 or 8 years ago.  She is a little trace swelling in her right ankle.  She denies any swelling in her hands or other places.  She did have a little bit more salt and rich foods over the Easter holiday.  She says she just noticed the swelling this morning.  He also has more cough and chest congestion.  We had treated her about 4 weeks ago for a COPD exacerbation with doxycycline  and prednisone  she did feel better and says even after she finished the antibiotic she felt better for maybe 3 or 4 days and then started coughing more and getting more congestion.  Also treated her in January for a COPD flare.  No recent fevers or chills.  She says she is coughing up mostly green mucus.  No sinus congestion.    ROS    Objective:     BP 111/66   Pulse 78   Ht 5\' 3"  (1.6 m)   Wt 124 lb (56.2 kg)   SpO2 100%   BMI 21.97 kg/m    Physical Exam Vitals and nursing note reviewed.  Constitutional:      Appearance: Normal appearance.  HENT:     Head: Normocephalic and atraumatic.  Eyes:     Conjunctiva/sclera: Conjunctivae normal.  Cardiovascular:     Rate and Rhythm: Normal rate and regular rhythm.  Pulmonary:     Effort: Pulmonary effort is normal.     Breath sounds: Normal breath sounds.     Comments: Bilateral coarse breath sounds but no crackles or rhonchi today. Skin:     General: Skin is warm and dry.  Neurological:     Mental Status: She is alert.  Psychiatric:        Mood and Affect: Mood normal.      Results for orders placed or performed in visit on 11/23/23  POCT HgB A1C  Result Value Ref Range   Hemoglobin A1C 5.7 (A) 4.0 - 5.6 %   HbA1c POC (<> result, manual entry)     HbA1c, POC (prediabetic range)     HbA1c, POC (controlled diabetic range)        The ASCVD Risk score (Arnett DK, et al., 2019) failed to calculate for the following reasons:   The 2019 ASCVD risk score is only valid for ages 38 to 75   Risk score cannot be calculated because patient has a medical history suggesting prior/existing ASCVD    Assessment & Plan:   Problem List Items Addressed This Visit       Cardiovascular and Mediastinum   Essential hypertension - Primary   Well controlled. Continue current regimen. Follow up in  6 mo       Relevant Medications   furosemide  (LASIX ) 20 MG tablet     Respiratory   COPD with asthma (HCC)  He also discussed that she continues to get frequent infections and chest congestion.  I think she would be a great candidate for azithromycin  use prophylactically for COPD I would at least like to do a trial for the next 3 to 4 months and see if it is helpful if she does well then we will continue the regimen for 1 year.  10-year with daily Trelegy use and as needed albuterol .      Relevant Medications   azithromycin  (ZITHROMAX ) 250 MG tablet (Start on 11/24/2023)     Endocrine   IFG (impaired fasting glucose)   A!C up from last time but still well controlled.  F/U in 6 months.        Relevant Orders   POCT HgB A1C (Completed)     Other   Swelling of left lower extremity   Is been a recurring issue I strongly suspect it was probably from increased salt intake over the Easter holiday.  M going to send in a few tabs of the furosemide  to take 1 tomorrow morning and see if that is helpful and then keep track of how often she is  needing or using the tabs hopefully it is very infrequent.  Continue to avoid excess salt and try to elevate legs.  She does have compression stockings on today and encouraged her to wear those regularly.      Relevant Medications   furosemide  (LASIX ) 20 MG tablet    Return in about 3 months (around 02/22/2024) for COPD.    Duaine German, MD

## 2023-11-23 NOTE — Telephone Encounter (Signed)
 Pharmacy calling for clarification on direction for medication prescribed today- see Rx.  Copied from CRM [id number]. Topic: Clinical - Medication Question >> Nov 23, 2023  2:23 PM Brynn Caras wrote: Reason for CRM: Madelyne Schiff a Pharmacist with [facility] is requesting verbal clear directions on the usage of this patient's medication: azithromycin  (ZITHROMAX ) 250 MG tablet. He states the current directions have two differentiating instructions of usage.  Callback #: [phone]

## 2023-11-23 NOTE — Assessment & Plan Note (Signed)
 Is been a recurring issue I strongly suspect it was probably from increased salt intake over the Easter holiday.  M going to send in a few tabs of the furosemide  to take 1 tomorrow morning and see if that is helpful and then keep track of how often she is needing or using the tabs hopefully it is very infrequent.  Continue to avoid excess salt and try to elevate legs.  She does have compression stockings on today and encouraged her to wear those regularly.

## 2023-11-23 NOTE — Assessment & Plan Note (Signed)
 He also discussed that she continues to get frequent infections and chest congestion.  I think she would be a great candidate for azithromycin  use prophylactically for COPD I would at least like to do a trial for the next 3 to 4 months and see if it is helpful if she does well then we will continue the regimen for 1 year.  10-year with daily Trelegy use and as needed albuterol .

## 2023-11-23 NOTE — Telephone Encounter (Signed)
 I apologize, 2 different sigs on the prescription I corrected it and resent.    Meds ordered this encounter  Medications   azithromycin  (ZITHROMAX ) 250 MG tablet    Sig: Take 2 tablets (500 mg total) by mouth 3 (three) times a week.    Dispense:  72 tablet    Refill:  1

## 2023-11-23 NOTE — Assessment & Plan Note (Signed)
 A!C up from last time but still well controlled.  F/U in 6 months.

## 2023-11-23 NOTE — Assessment & Plan Note (Signed)
 Well controlled. Continue current regimen. Follow up in  6 mo

## 2023-11-24 ENCOUNTER — Encounter: Payer: Self-pay | Admitting: Family Medicine

## 2023-11-24 LAB — CBC WITH DIFFERENTIAL/PLATELET
Basophils Absolute: 0.1 10*3/uL (ref 0.0–0.2)
Basos: 1 %
EOS (ABSOLUTE): 1.2 10*3/uL — ABNORMAL HIGH (ref 0.0–0.4)
Eos: 13 %
Hematocrit: 42.4 % (ref 34.0–46.6)
Hemoglobin: 14.1 g/dL (ref 11.1–15.9)
Immature Grans (Abs): 0.1 10*3/uL (ref 0.0–0.1)
Immature Granulocytes: 1 %
Lymphocytes Absolute: 3.9 10*3/uL — ABNORMAL HIGH (ref 0.7–3.1)
Lymphs: 44 %
MCH: 32.1 pg (ref 26.6–33.0)
MCHC: 33.3 g/dL (ref 31.5–35.7)
MCV: 97 fL (ref 79–97)
Monocytes Absolute: 0.7 10*3/uL (ref 0.1–0.9)
Monocytes: 7 %
Neutrophils Absolute: 3 10*3/uL (ref 1.4–7.0)
Neutrophils: 34 %
Platelets: 350 10*3/uL (ref 150–450)
RBC: 4.39 x10E6/uL (ref 3.77–5.28)
RDW: 12.2 % (ref 11.7–15.4)
WBC: 8.8 10*3/uL (ref 3.4–10.8)

## 2023-11-24 LAB — CMP14+EGFR
ALT: 10 [IU]/L (ref 0–32)
AST: 18 [IU]/L (ref 0–40)
Albumin: 4.2 g/dL (ref 3.7–4.7)
Alkaline Phosphatase: 53 [IU]/L (ref 44–121)
BUN/Creatinine Ratio: 19 (ref 12–28)
BUN: 20 mg/dL (ref 8–27)
Bilirubin Total: 0.3 mg/dL (ref 0.0–1.2)
CO2: 21 mmol/L (ref 20–29)
Calcium: 10.1 mg/dL (ref 8.7–10.3)
Chloride: 102 mmol/L (ref 96–106)
Creatinine, Ser: 1.03 mg/dL — ABNORMAL HIGH (ref 0.57–1.00)
Globulin, Total: 2.3 g/dL (ref 1.5–4.5)
Glucose: 89 mg/dL (ref 70–99)
Potassium: 4.3 mmol/L (ref 3.5–5.2)
Sodium: 140 mmol/L (ref 134–144)
Total Protein: 6.5 g/dL (ref 6.0–8.5)
eGFR: 52 mL/min/{1.73_m2} — ABNORMAL LOW

## 2023-11-24 NOTE — Progress Notes (Signed)
 Your lab work is within acceptable range and there are no concerning findings.   ?

## 2023-12-14 ENCOUNTER — Other Ambulatory Visit: Payer: Self-pay | Admitting: Family Medicine

## 2023-12-14 DIAGNOSIS — M7989 Other specified soft tissue disorders: Secondary | ICD-10-CM

## 2023-12-16 ENCOUNTER — Other Ambulatory Visit: Payer: Self-pay | Admitting: Family Medicine

## 2024-01-01 ENCOUNTER — Other Ambulatory Visit: Payer: Self-pay | Admitting: Family Medicine

## 2024-01-17 ENCOUNTER — Other Ambulatory Visit: Payer: Self-pay | Admitting: Family Medicine

## 2024-01-17 DIAGNOSIS — F4329 Adjustment disorder with other symptoms: Secondary | ICD-10-CM

## 2024-01-17 DIAGNOSIS — J4541 Moderate persistent asthma with (acute) exacerbation: Secondary | ICD-10-CM

## 2024-01-18 NOTE — Telephone Encounter (Signed)
 Please advise on refill request

## 2024-01-19 ENCOUNTER — Telehealth: Payer: Self-pay

## 2024-01-19 NOTE — Telephone Encounter (Signed)
 Patient daughter rescheduled to 02/22/2024.  Spoke with patient daughter - states issue has now been resolved as she will be with her mother for her AWV appt.

## 2024-01-19 NOTE — Telephone Encounter (Signed)
 Copied from CRM [id number]. Topic: Clinical - Medical Advice >> Jan 19, 2024  1:03 PM Danelle Dunning F wrote: Reason for CRM:   Caller Name:  Erika Lynch  Concern: Patient's daughter received a text to confirm the patient's telephone appointment for 01/25/24 at 10:00am; Patient's daughter does all of the maintenance for her prescriptions and sorting them for the month; The patient tends to get very overwhelmed when asked a good amount of questions due to her previous stroke and experiencing a good deal of confusion. Patient daughter, Erika Lynch, was wondering if this style of appointment would be best suited for the patient under these circumstances. Call was disconnected due to VDI and Agent was not able to get in contact with the caller via callback once reconnected. Please call patient's daughter to advise on the matter.  Contact Information:  Phone: [phone]

## 2024-01-24 DIAGNOSIS — H26491 Other secondary cataract, right eye: Secondary | ICD-10-CM | POA: Diagnosis not present

## 2024-01-24 DIAGNOSIS — H524 Presbyopia: Secondary | ICD-10-CM | POA: Diagnosis not present

## 2024-01-25 ENCOUNTER — Encounter

## 2024-02-10 ENCOUNTER — Encounter: Payer: Self-pay | Admitting: Family Medicine

## 2024-02-10 ENCOUNTER — Ambulatory Visit (INDEPENDENT_AMBULATORY_CARE_PROVIDER_SITE_OTHER): Admitting: Family Medicine

## 2024-02-10 VITALS — BP 110/69 | HR 77 | Ht 63.0 in | Wt 120.1 lb

## 2024-02-10 DIAGNOSIS — N1831 Chronic kidney disease, stage 3a: Secondary | ICD-10-CM

## 2024-02-10 DIAGNOSIS — J4489 Other specified chronic obstructive pulmonary disease: Secondary | ICD-10-CM

## 2024-02-10 DIAGNOSIS — R531 Weakness: Secondary | ICD-10-CM

## 2024-02-10 DIAGNOSIS — I63412 Cerebral infarction due to embolism of left middle cerebral artery: Secondary | ICD-10-CM

## 2024-02-10 DIAGNOSIS — I10 Essential (primary) hypertension: Secondary | ICD-10-CM

## 2024-02-10 MED ORDER — ALBUTEROL SULFATE HFA 108 (90 BASE) MCG/ACT IN AERS: 2.0000 | INHALATION_SPRAY | Freq: Four times a day (QID) | RESPIRATORY_TRACT | 1 refills | Status: AC | PRN

## 2024-02-10 MED ORDER — ALBUTEROL SULFATE (2.5 MG/3ML) 0.083% IN NEBU: 2.5000 mg | INHALATION_SOLUTION | Freq: Four times a day (QID) | RESPIRATORY_TRACT | 12 refills | Status: AC | PRN

## 2024-02-10 MED ORDER — ALBUTEROL SULFATE HFA 108 (90 BASE) MCG/ACT IN AERS: 2.0000 | INHALATION_SPRAY | Freq: Four times a day (QID) | RESPIRATORY_TRACT | 3 refills | Status: DC | PRN

## 2024-02-10 NOTE — Assessment & Plan Note (Signed)
 Due to recheck renal function.  She takes furosemide  occasionally.

## 2024-02-10 NOTE — Progress Notes (Signed)
 Established Patient Office Visit  Subjective  Patient ID: Erika Lynch, female    DOB: 11/29/1932  Age: 89 y.o. MRN: [id number]  Chief Complaint  Patient presents with   Fatigue   Cough    HPI  Pt reports that about 2 weeks ago her legs felt very heavy and it was a struggle for her to get up out of her bed. Then the sensation went over her body.     She stated that from that point that feeling went from her legs and moved throughout her entire body.  This last almost 2 weeks.  She says it is the most like they felt weak and heavy and she really just laid around.  Which her daughter says is not like her at all she is constantly moving and doing things and cleaning.  She does feel little better this week she denies any other viral symptoms no diarrhea.  Her daughter says she does not do a great job at staying hydrated but does drink decaf coffee throughout the day.  She did not have any changes to her medication regimen.  She denies any swelling in her legs around that time.   Pt has also been coughing more it  was worse this morning and her cough has been productive. While going over her medication list I asked if she was still using the Dou-neb she stated that she stopped using this because it caused her to have an upset stomach and was advised to stop using.     She also wanted to know if she needed to continue with her Plavix .  She says she has been on it for about 2 years for stroke.  She bruises easily.    ROS    Objective:     BP 110/69   Pulse 77   Ht 5' 3 (1.6 m)   Wt 120 lb 1.3 oz (54.5 kg)   SpO2 100%   BMI 21.27 kg/m    Physical Exam Vitals and nursing note reviewed.  Constitutional:      Appearance: Normal appearance.  HENT:     Head: Normocephalic and atraumatic.  Eyes:     Conjunctiva/sclera: Conjunctivae normal.  Cardiovascular:     Rate and Rhythm: Normal rate and regular rhythm.  Pulmonary:     Effort: Pulmonary effort is normal.     Breath  sounds: Normal breath sounds.     Comments: Pursed lip breathing Skin:    General: Skin is warm and dry.  Neurological:     Mental Status: She is alert.  Psychiatric:        Mood and Affect: Mood normal.      No results found for any visits on 02/10/24.    The ASCVD Risk score (Arnett DK, et al., 2019) failed to calculate for the following reasons:   The 2019 ASCVD risk score is only valid for ages 67 to 50   Risk score cannot be calculated because patient has a medical history suggesting prior/existing ASCVD    Assessment & Plan:   Problem List Items Addressed This Visit       Cardiovascular and Mediastinum   Essential hypertension   BP at goal today.       Cerebrovascular accident (CVA) due to embolism of left middle cerebral artery (HCC)   She wants to be able to stop the Plavix .  This was started almost 2 years ago after she had a stroke.  She would really be interested in  coming off of the medication.  Also on Lipitor        Respiratory   COPD with asthma (HCC)   She did try the admits Zithromycin 3 days a week for about the last 3 months but did not notice any reduction in her respiratory symptoms or improvement in her sputum production.  So we will discontinue the medication for now.  She is using her Trelegy. She says the Duoneb nebulizer makes her nauseated so we can change to albuterol .   We discussed using the Trelegy every day and then using the albuterol  as needed she also needs a refill on the inhaler albuterol .  Says her chronic cough and sputum levels are the same no recent changes or worsening.  She says that her oxygen  when she checks it at home is almost always in the 90s.      Relevant Medications   albuterol  (PROVENTIL ) (2.5 MG/3ML) 0.083% nebulizer solution   albuterol  (VENTOLIN  HFA) 108 (90 Base) MCG/ACT inhaler   Other Relevant Orders   CMP14+EGFR   CBC with Differential/Platelet   CK (Creatine Kinase)   TSH   B12 and Folate Panel   Vitamin B1    Vitamin B6   Magnesium   Phosphorus     Genitourinary   CKD stage G3a/A1, GFR 45-59 and albumin creatinine ratio <30 mg/g (HCC)   Due to recheck renal function.  She takes furosemide  occasionally.        Other   Weakness - Primary   Unclear etiology it sounds like the weakness that she was experiencing came on pretty rapidly started in her legs and then gradually moved up her body and lasted a couple of weeks it is a little unusual.  She does have a history of strokes but she is taking her Plavix  regularly and it sounds like her symptoms were pretty symmetric which would be unusual for stroke.  Will check labs today just to make sure that she is not anemic or something else is making her feel weak but again she does feel better this week and has been able to get out of bed and do some things.  Will check CK levels.  She is on a statin but she has not changed her dose she has been on the same medication for couple years.  She feels like her breathing is stable no recent changes.      Relevant Orders   CMP14+EGFR   CBC with Differential/Platelet   CK (Creatine Kinase)   TSH   B12 and Folate Panel   Vitamin B1   Vitamin B6   Magnesium   Phosphorus    X-ray performed March 2025 was normal.  No follow-ups on file.    Dorothyann Byars, MD

## 2024-02-10 NOTE — Assessment & Plan Note (Addendum)
 She did try the admits Zithromycin 3 days a week for about the last 3 months but did not notice any reduction in her respiratory symptoms or improvement in her sputum production.  So we will discontinue the medication for now.  She is using her Trelegy. She says the Duoneb nebulizer makes her nauseated so we can change to albuterol .   We discussed using the Trelegy every day and then using the albuterol  as needed she also needs a refill on the inhaler albuterol .  Says her chronic cough and sputum levels are the same no recent changes or worsening.  She says that her oxygen  when she checks it at home is almost always in the 90s.

## 2024-02-10 NOTE — Assessment & Plan Note (Signed)
 Unclear etiology it sounds like the weakness that she was experiencing came on pretty rapidly started in her legs and then gradually moved up her body and lasted a couple of weeks it is a little unusual.  She does have a history of strokes but she is taking her Plavix  regularly and it sounds like her symptoms were pretty symmetric which would be unusual for stroke.  Will check labs today just to make sure that she is not anemic or something else is making her feel weak but again she does feel better this week and has been able to get out of bed and do some things.  Will check CK levels.  She is on a statin but she has not changed her dose she has been on the same medication for couple years.  She feels like her breathing is stable no recent changes.

## 2024-02-10 NOTE — Progress Notes (Signed)
 Pt reports that about 2 weeks ago her legs felt very heavy and it was a struggle for her to get up out of her bed. Pt has been wearing her compression socks but took them off today so her legs could be checked.  She stated that from that point that feeling went from her legs and moved throughout her entire body.    Pt has also been coughing more it  was worse this morning and her cough has been productive. While going over her medication list I asked if she was still using the Dou-neb she stated that she stopped using this because it caused her to have an upset stomach and was advised to stop using it.

## 2024-02-10 NOTE — Assessment & Plan Note (Signed)
 She wants to be able to stop the Plavix .  This was started almost 2 years ago after she had a stroke.  She would really be interested in coming off of the medication.  Also on Lipitor

## 2024-02-10 NOTE — Assessment & Plan Note (Signed)
 BP at goal today.

## 2024-02-11 ENCOUNTER — Ambulatory Visit: Payer: Self-pay | Admitting: Family Medicine

## 2024-02-11 NOTE — Progress Notes (Signed)
 Hi Erika Lynch, kidney function is stable at 1.1.  Liver function looks great.  Your white blood cells were up just a little just a little borderline.  If at any point you feel like you are developing more sputum or congestion or your cough is getting a little worse then please let me know.  The muscle enzyme level look normal.  Your thyroid  level was normal.  Magnesium and phosphorus is normal.  B12 and folate look great.  B1 is still pending and B6 is still pending.  Nothing really off to explain why you are feeling so weak.  If it happens again please call me while it is happening.

## 2024-02-14 LAB — CBC WITH DIFFERENTIAL/PLATELET
Basophils Absolute: 0.1 x10E3/uL (ref 0.0–0.2)
Basos: 1 %
EOS (ABSOLUTE): 1.5 x10E3/uL — ABNORMAL HIGH (ref 0.0–0.4)
Eos: 13 %
Hematocrit: 44.6 % (ref 34.0–46.6)
Hemoglobin: 14.5 g/dL (ref 11.1–15.9)
Immature Grans (Abs): 0 x10E3/uL (ref 0.0–0.1)
Immature Granulocytes: 0 %
Lymphocytes Absolute: 4.9 x10E3/uL — ABNORMAL HIGH (ref 0.7–3.1)
Lymphs: 43 %
MCH: 32.4 pg (ref 26.6–33.0)
MCHC: 32.5 g/dL (ref 31.5–35.7)
MCV: 100 fL — ABNORMAL HIGH (ref 79–97)
Monocytes Absolute: 0.8 x10E3/uL (ref 0.1–0.9)
Monocytes: 7 %
Neutrophils Absolute: 4.2 x10E3/uL (ref 1.4–7.0)
Neutrophils: 36 %
Platelets: 280 x10E3/uL (ref 150–450)
RBC: 4.48 x10E6/uL (ref 3.77–5.28)
RDW: 13 % (ref 11.7–15.4)
WBC: 11.6 x10E3/uL — ABNORMAL HIGH (ref 3.4–10.8)

## 2024-02-14 LAB — B12 AND FOLATE PANEL
Folate: 11.5 ng/mL (ref >3.0)
Vitamin B-12: 441 pg/mL (ref 232–1245)

## 2024-02-14 LAB — CMP14+EGFR
ALT: 14 IU/L (ref 0–32)
AST: 23 IU/L (ref 0–40)
Albumin: 4.4 g/dL (ref 3.7–4.7)
Alkaline Phosphatase: 46 IU/L (ref 44–121)
BUN/Creatinine Ratio: 22 (ref 12–28)
BUN: 25 mg/dL (ref 8–27)
Bilirubin Total: 0.3 mg/dL (ref 0.0–1.2)
CO2: 20 mmol/L (ref 20–29)
Calcium: 9.7 mg/dL (ref 8.7–10.3)
Chloride: 101 mmol/L (ref 96–106)
Creatinine, Ser: 1.16 mg/dL — ABNORMAL HIGH (ref 0.57–1.00)
Globulin, Total: 2.2 g/dL (ref 1.5–4.5)
Glucose: 88 mg/dL (ref 70–99)
Potassium: 4.4 mmol/L (ref 3.5–5.2)
Sodium: 140 mmol/L (ref 134–144)
Total Protein: 6.6 g/dL (ref 6.0–8.5)
eGFR: 45 mL/min/1.73 — ABNORMAL LOW (ref >59)

## 2024-02-14 LAB — MAGNESIUM: Magnesium: 2 mg/dL (ref 1.6–2.3)

## 2024-02-14 LAB — CK: Total CK: 78 U/L (ref 26–161)

## 2024-02-14 LAB — TSH: TSH: 3.63 u[IU]/mL (ref 0.450–4.500)

## 2024-02-14 LAB — VITAMIN B1: Thiamine: 142.6 nmol/L (ref 66.5–200.0)

## 2024-02-14 LAB — VITAMIN B6

## 2024-02-14 LAB — PHOSPHORUS: Phosphorus: 3.5 mg/dL (ref 3.0–4.3)

## 2024-02-15 NOTE — Progress Notes (Signed)
 Hi Erika Lynch, your vitamin B1 looks great and your vitamin B6 for some reason was canceled.  So we may have to drawl that the next time we do labs.

## 2024-02-22 ENCOUNTER — Ambulatory Visit

## 2024-02-22 ENCOUNTER — Ambulatory Visit (INDEPENDENT_AMBULATORY_CARE_PROVIDER_SITE_OTHER): Admitting: Family Medicine

## 2024-02-22 ENCOUNTER — Encounter: Payer: Self-pay | Admitting: Family Medicine

## 2024-02-22 VITALS — BP 127/64 | HR 73 | Ht 61.42 in | Wt 120.1 lb

## 2024-02-22 VITALS — Ht 63.0 in | Wt 124.0 lb

## 2024-02-22 DIAGNOSIS — J4489 Other specified chronic obstructive pulmonary disease: Secondary | ICD-10-CM

## 2024-02-22 DIAGNOSIS — Z0000 Encounter for general adult medical examination without abnormal findings: Secondary | ICD-10-CM

## 2024-02-22 DIAGNOSIS — M7989 Other specified soft tissue disorders: Secondary | ICD-10-CM

## 2024-02-22 DIAGNOSIS — R531 Weakness: Secondary | ICD-10-CM | POA: Diagnosis not present

## 2024-02-22 DIAGNOSIS — R202 Paresthesia of skin: Secondary | ICD-10-CM | POA: Diagnosis not present

## 2024-02-22 MED ORDER — FUROSEMIDE 20 MG PO TABS: 20.0000 mg | ORAL_TABLET | Freq: Every day | ORAL | 1 refills | Status: DC | PRN

## 2024-02-22 NOTE — Assessment & Plan Note (Addendum)
 She is actually doing well.  Back to baseline using her Trelegy daily and then her nebulizer as needed.

## 2024-02-22 NOTE — Progress Notes (Signed)
 Subjective:   Erika Lynch is a 89 y.o. female who presents for Medicare Annual (Subsequent) preventive examination.  Visit Complete: Virtual I connected with  Erika Lynch on 02/22/24 by a audio enabled telemedicine application and verified that I am speaking with the correct person using two identifiers.  Patient Location: Home  Provider Location: Office/Clinic  I discussed the limitations of evaluation and management by telemedicine. The patient expressed understanding and agreed to proceed.  Vital Signs: Because this visit was a virtual/telehealth visit, some criteria may be missing or patient reported. Any vitals not documented were not able to be obtained and vitals that have been documented are patient reported.  Patient Medicare AWV questionnaire was completed by the patient on n/a; I have confirmed that all information answered by patient is correct and no changes since this date.  Cardiac Risk Factors include: advanced age (>72men, >44 women);hypertension;family history of premature cardiovascular disease     Objective:    Today's Vitals   02/22/24 1301  Weight: 124 lb (56.2 kg)  Height: 5' 3 (1.6 m)   Body mass index is 21.97 kg/m.     02/22/2024    1:15 PM 01/18/2023    2:10 PM 01/12/2022    2:01 PM 08/19/2020    1:11 PM 08/14/2019    1:02 PM 08/10/2018   10:46 AM 11/23/2013    3:05 PM  Advanced Directives  Does Patient Have a Medical Advance Directive? Yes Yes Yes Yes Yes Yes  Patient has advance directive, copy not in chart   Type of Advance Directive Healthcare Power of [place];Living will Living will Healthcare Power of Attorney Living will Healthcare Power of [place];Living will Healthcare Power of [place];Living will Healthcare Power of [place];Living will   Does patient want to make changes to medical advance directive?  No - Patient declined No - Patient declined No - Patient declined No - Patient declined No - Patient declined    Copy of Healthcare  Power of Attorney in Chart? No - copy requested  Yes - validated most recent copy scanned in chart (See row information)  Yes - validated most recent copy scanned in chart (See row information) Yes - validated most recent copy scanned in chart (See row information)  Copy requested from family      Data saved with a previous flowsheet row definition    Current Medications (verified) Outpatient Encounter Medications as of 02/22/2024  Medication Sig   acetaminophen  (TYLENOL ) 325 MG tablet Take by mouth. As needed   albuterol  (PROVENTIL ) (2.5 MG/3ML) 0.083% nebulizer solution Take 3 mLs (2.5 mg total) by nebulization every 6 (six) hours as needed for wheezing or shortness of breath.   albuterol  (VENTOLIN  HFA) 108 (90 Base) MCG/ACT inhaler Inhale 2 puffs into the lungs every 6 (six) hours as needed for wheezing or shortness of breath.   alendronate  (FOSAMAX ) 70 MG tablet TAKE 1 TABLET BY MOUTH WEEKLY ON AN EMPTY STOMACH WITH 8 OZ OF  PLAIN WATER 30 MINUTES BEFORE  FIRST FOOD, DRINK OR MEDS. STAY  UPRIGHT FOR 30 MIN   amLODipine  (NORVASC ) 5 MG tablet TAKE 1 TABLET BY MOUTH DAILY   atorvastatin  (LIPITOR) 20 MG tablet Take 1 tablet (20 mg total) by mouth daily.   calcium  carbonate (TUMS EX) 750 MG chewable tablet Chew 1 tablet by mouth daily.   clopidogrel  (PLAVIX ) 75 MG tablet TAKE 1 TABLET BY MOUTH DAILY   DULoxetine  (CYMBALTA ) 30 MG capsule TAKE 1 CAPSULE BY MOUTH DAILY  fluticasone  (FLONASE ) 50 MCG/ACT nasal spray USE 1 SPRAY INTO BOTH NOSTRILS DAILY   furosemide  (LASIX ) 20 MG tablet Take 1 tablet (20 mg total) by mouth daily as needed.   loratadine  (CLARITIN ) 10 MG tablet Take 1 tablet (10 mg total) by mouth daily.   meclizine  (ANTIVERT ) 25 MG tablet Take 1 tablet (25 mg total) by mouth 3 (three) times daily as needed for dizziness.   metoprolol  succinate (TOPROL -XL) 50 MG 24 hr tablet TAKE 1 TABLET BY MOUTH DAILY   ondansetron  (ZOFRAN ) 4 MG tablet Take 4 mg by mouth every 8 (eight) hours as  needed for nausea or vomiting.   pantoprazole  (PROTONIX ) 40 MG tablet TAKE 1 TABLET BY MOUTH DAILY .  REPLACES OMEPRAZOLE    TRELEGY ELLIPTA  100-62.5-25 MCG/ACT AEPB USE 1 INHALATION BY MOUTH DAILY   No facility-administered encounter medications on file as of 02/22/2024.    Allergies (verified) Other, Sulfa antibiotics, Aspirin, Cisapride, Dtap-ipv vaccine, Imipramine hcl, Lexapro  [escitalopram ], Mometasone , Penicillins, and Sulfonamide derivatives   History: Past Medical History:  Diagnosis Date   Cataract    Dyskinesia of esophagus    Hypertension    Kidney stone    Stroke (HCC) 08/09/2021   Uterine cancer (HCC)    Vertigo 07/16/2020   Past Surgical History:  Procedure Laterality Date   ABDOMINAL HYSTERECTOMY     Uterine cancer   BACK SURGERY     CATARACT EXTRACTION, BILATERAL     Family History  Problem Relation Age of Onset   Heart attack Father    Stroke Father    Colon cancer Sister    Cancer Sister        lung   Heart attack Brother    Heart attack Brother    Breast cancer Other    Social History   Socioeconomic History   Marital status: Divorced    Spouse name: Not on file   Number of children: 4   Years of education: 8th    Highest education level: 11th grade  Occupational History   Occupation: Retired.     Employer: RETIRED    Comment: [employer]  Tobacco Use   Smoking status: Former    Types: Cigarettes    Start date: 08/10/2021   Smokeless tobacco: Never   Tobacco comments:    smokes cig . less than a half pack a week. Patient has not smoked in a few weeks   Vaping Use   Vaping status: Never Used  Substance and Sexual Activity   Alcohol use: No   Drug use: No   Sexual activity: Not Currently  Other Topics Concern   Not on file  Social History Narrative   She lives alone. 3 living children: Erika Lynch, Erika Lynch.  Daily caffeine use. Regular exercise everyday, walking and doing stretching. She looks to sewing, puzzles, workbooks and adult  coloring books.   Social Drivers of Corporate investment banker Strain: Low Risk  (02/22/2024)   Overall Financial Resource Strain (CARDIA)    Difficulty of Paying Living Expenses: Not hard at all  Food Insecurity: No Food Insecurity (02/22/2024)   Hunger Vital Sign    Worried About Running Out of Food in the Last Year: Never true    Ran Out of Food in the Last Year: Never true  Transportation Needs: No Transportation Needs (02/22/2024)   PRAPARE - Administrator, Civil Service (Medical): No    Lack of Transportation (Non-Medical): No  Physical Activity: Insufficiently Active (02/22/2024)   Exercise  Vital Sign    Days of Exercise per Week: 3 days    Minutes of Exercise per Session: 30 min  Stress: No Stress Concern Present (02/22/2024)   [employer] of Occupational Health - Occupational Stress Questionnaire    Feeling of Stress: Not at all  Social Connections: Moderately Isolated (02/22/2024)   Social Connection and Isolation Panel    Frequency of Communication with Friends and Family: More than three times a week    Frequency of Social Gatherings with Friends and Family: More than three times a week    Attends Religious Services: Never    Database administrator or Organizations: Yes    Attends Engineer, structural: More than 4 times per year    Marital Status: Divorced    Tobacco Counseling Counseling given: Not Answered Tobacco comments: smokes cig . less than a half pack a week. Patient has not smoked in a few weeks    Clinical Intake:  Pre-visit preparation completed: Yes  Pain : No/denies pain     BMI - recorded: 21.97 Nutritional Status: BMI of 19-24  Normal Nutritional Risks: None Diabetes: No  How often do you need to have someone help you when you read instructions, pamphlets, or other written materials from your doctor or pharmacy?: 1 - Never What is the last grade level you completed in school?: 8  Interpreter Needed?: No       Activities of Daily Living    02/22/2024    1:03 PM  In your present state of health, do you have any difficulty performing the following activities:  Hearing? 1  Vision? 0  Difficulty concentrating or making decisions? 1  Walking or climbing stairs? 1  Dressing or bathing? 0  Doing errands, shopping? 0  Preparing Food and eating ? N  Using the Toilet? N  In the past six months, have you accidently leaked urine? N  Do you have problems with loss of bowel control? N  Managing your Medications? N  Managing your Finances? N  Housekeeping or managing your Housekeeping? N    Patient Care Team: Alvan Dorothyann BIRCH, MD as PCP - General McDonald, Bettyann DASEN, MD as Consulting Physician (Neurology) Amos Thresa Ned, MD as Referring Physician (Gastroenterology) Rinaldo Vinie BRAVO, MD as Referring Physician (Cardiology) Arva Aquas, MD as Referring Physician (Nephrology)  Indicate any recent Medical Services you may have received from other than [facility] providers in the past year (date may be approximate).     Assessment:   This is a routine wellness examination for [facility].  Hearing/Vision screen No results found.   Goals Addressed             This Visit's Progress    Patient Stated       Patient would like to be as healthy and active.        Depression Screen    02/22/2024    1:13 PM 08/31/2023   12:00 PM 05/25/2023    1:26 PM 02/19/2023    2:22 PM 01/18/2023    2:10 PM 06/15/2022    4:33 PM 04/20/2022    2:17 PM  PHQ 2/9 Scores  PHQ - 2 Score 0 0 0 0 0 0 0  PHQ- 9 Score   3        Fall Risk    02/22/2024    1:15 PM 08/31/2023   11:59 AM 02/19/2023    2:22 PM 01/18/2023    2:10 PM 04/20/2022    2:17 PM  Fall Risk   Falls in the past year? 0 0 0 0 0  Number falls in past yr: 0 0 0 0 0  Injury with Fall? 0 0 0 0 0  Risk for fall due to : No Fall Risks No Fall Risks No Fall Risks No Fall Risks No Fall Risks  Follow up Falls evaluation completed Falls  evaluation completed Falls evaluation completed Falls evaluation completed Falls evaluation completed      Data saved with a previous flowsheet row definition    MEDICARE RISK AT HOME: Medicare Risk at Home Any stairs in or around the home?: No Home free of loose throw rugs in walkways, pet beds, electrical cords, etc?: Yes Adequate lighting in your home to reduce risk of falls?: Yes Life alert?: No Use of a cane, walker or w/c?: No Grab bars in the bathroom?: Yes Shower chair or bench in shower?: Yes Elevated toilet seat or a handicapped toilet?: Yes  TIMED UP AND GO:  Was the test performed?  No    Cognitive Function:      09/08/2022    2:00 PM  Montreal Cognitive Assessment   Visuospatial/ Executive (0/5) 2  Naming (0/3) 3  Attention: Read list of digits (0/2) 2  Attention: Read list of letters (0/1) 1  Attention: Serial 7 subtraction starting at 100 (0/3) 3  Language: Repeat phrase (0/2) 2  Language : Fluency (0/1) 0  Abstraction (0/2) 2  Delayed Recall (0/5) 5  Orientation (0/6) 4  Total 24      02/22/2024    1:16 PM 01/18/2023    2:19 PM 01/12/2022    2:12 PM 08/19/2020    1:14 PM 08/14/2019    1:07 PM  6CIT Screen  What Year? 0 points 0 points 4 points 0 points 0 points  What month? 0 points 0 points 0 points 0 points 0 points  What time? 0 points 0 points 0 points 0 points 0 points  Count back from 20 0 points 0 points 2 points 0 points 2 points  Months in reverse 4 points 4 points 4 points 0 points 2 points  Repeat phrase 2 points 2 points 2 points 2 points 0 points  Total Score 6 points 6 points 12 points 2 points 4 points    Immunizations Immunization History  Administered Date(s) Administered   Fluad Quad(high Dose 65+) 08/14/2019, 06/11/2020   Fluad Trivalent(High Dose 65+) 05/25/2023   Influenza Split 05/04/2012   Influenza Whole 05/17/2008, 05/27/2009, 04/17/2010   Influenza, High Dose Seasonal PF 04/15/2016, 03/25/2017, 08/08/2018    Influenza-Unspecified 04/03/2013, 04/20/2015   Pneumococcal Conjugate-13 05/13/2015   Pneumococcal Polysaccharide-23 12/04/2008   Td 12/04/2008   Zoster, Live 05/07/2010    T-Dap - allergic   Flu Vaccine status: Up to date  Pneumococcal vaccine status: Up to date  Covid-19 vaccine status: Declined, Education has been provided regarding the importance of this vaccine but patient still declined. Advised may receive this vaccine at local pharmacy or Health Dept.or vaccine clinic. Aware to provide a copy of the vaccination record if obtained from local pharmacy or Health Dept. Verbalized acceptance and understanding.  Qualifies for Shingles Vaccine? Yes   Zostavax completed Yes   Shingrix Completed?: No.    Education has been provided regarding the importance of this vaccine. Patient has been advised to call insurance company to determine out of pocket expense if they have not yet received this vaccine. Advised may also receive vaccine at local pharmacy or Health Dept.  Verbalized acceptance and understanding.  Screening Tests Health Maintenance  Topic Date Due   COVID-19 Vaccine (1) Never done   Zoster Vaccines- Shingrix (1 of 2) 01/27/1952   DTaP/Tdap/Td (2 - Tdap) 12/05/2018   INFLUENZA VACCINE  03/03/2024   Medicare Annual Wellness (AWV)  02/21/2025   Pneumococcal Vaccine: 50+ Years  Completed   DEXA SCAN  Completed   Hepatitis B Vaccines  Aged Out   HPV VACCINES  Aged Out   Meningococcal B Vaccine  Aged Out    Health Maintenance  Health Maintenance Due  Topic Date Due   COVID-19 Vaccine (1) Never done   Zoster Vaccines- Shingrix (1 of 2) 01/27/1952   DTaP/Tdap/Td (2 - Tdap) 12/05/2018    Colorectal cancer screening: No longer required.   Mammogram status: No longer required due to age.  Bone Density status: Completed 01/28/2022. Results reflect: Bone density results: OSTEOPOROSIS. Repeat every 2 years.  Lung Cancer Screening: (Low Dose CT Chest recommended if Age 28-80  years, 20 pack-year currently smoking OR have quit w/in 15years.) does not qualify.   Lung Cancer Screening Referral: n/a  Additional Screening:  Hepatitis C Screening: does not qualify; Completed   Vision Screening: Recommended annual ophthalmology exams for early detection of glaucoma and other disorders of the eye. Is the patient up to date with their annual eye exam?  Yes  Who is the provider or what is the name of the office in which the patient attends [facility] exams[facility] If pt is not established with a provider, would they like to be referred to a provider to establish care? N/a.   Dental Screening: Recommended annual dental exams for proper oral hygiene   Community Resource Referral / Chronic Care Management: CRR required this visit?  No   CCM required this visit?  No     Plan:     I have personally reviewed and noted the following in the patient's chart:   Medical and social history Use of alcohol, tobacco or illicit drugs  Current medications and supplements including opioid prescriptions. Patient is not currently taking opioid prescriptions. Functional ability and status Nutritional status Physical activity Advanced directives List of other physicians Hospitalizations, surgeries, and ER visits in previous 12 months. None Vitals Screenings to include cognitive, depression, and falls Referrals and appointments  In addition, I have reviewed and discussed with patient certain preventive protocols, quality metrics, and best practice recommendations. A written personalized care plan for preventive services as well as general preventive health recommendations were provided to patient.     Bonny Jon Mayor, CMA   02/22/2024   After Visit Summary: (MyChart) Due to this being a telephonic visit, the after visit summary with patients personalized plan was offered to patient via MyChart   Nurse Notes:   KEYANAH KOZICKI is a 89 y.o. female patient of  [name], Dorothyann BIRCH, MD who had a Medicare Annual Wellness Visit today via telephone. Syreeta is Retired and lives alone. She had 4 children; one has deceased. She reports that she is socially active and does interact with friends/family regularly. She is moderately physically active and enjoys sewing, workbooks, coloring books and puzzles.

## 2024-02-22 NOTE — Progress Notes (Signed)
 Established Patient Office Visit  Subjective  Patient ID: Erika Lynch, female    DOB: [birth year]  Age: 89 y.o. MRN: [id number]  Chief Complaint  Patient presents with   COPD    HPI  Pt states that she is doing well on the nebulizer. She is back to baseline in regards to her breathing.  She says the nebulizers actually been really great.  She feels like it has been helpful especially when she gets a lot of mucus and phlegm buildup.  She is back to working on her puzzles.   The weakness that she was experiencing when she was last seen on 7/10 has gotten better. She continues to wear the compression stockings.  She still getting the creepy crawly sensation in her legs.  She says she soaks them in Epsom salts and sometimes vinegar and that actually really helps.   She would like a refill of the Furosemide  20mg  to be sent to her mail order pharmacy  Usually if she takes the Lasix  she takes it for maybe 3 days in a row and then she is able to take a break for a while she is wearing her compression socks today.    ROS    Objective:     BP 127/64   Pulse 73   Ht 5' 1.42 (1.56 m)   Wt 120 lb 1.9 oz (54.5 kg)   SpO2 99%   BMI 22.39 kg/m    Physical Exam Vitals and nursing note reviewed.  Constitutional:      Appearance: Normal appearance.  HENT:     Head: Normocephalic and atraumatic.  Eyes:     Conjunctiva/sclera: Conjunctivae normal.  Cardiovascular:     Rate and Rhythm: Normal rate and regular rhythm.  Pulmonary:     Effort: Pulmonary effort is normal.     Breath sounds: Normal breath sounds.  Skin:    General: Skin is warm and dry.  Neurological:     Mental Status: She is alert.  Psychiatric:        Mood and Affect: Mood normal.      No results found for any visits on 02/22/24.    The ASCVD Risk score (Arnett DK, et al., 2019) failed to calculate for the following reasons:   The 2019 ASCVD risk score is only valid for ages 53 to 46   Risk score cannot  be calculated because patient has a medical history suggesting prior/existing ASCVD    Assessment & Plan:   Problem List Items Addressed This Visit       Respiratory   COPD with asthma (HCC) - Primary   She is actually doing well.  Back to baseline using her Trelegy daily and then her nebulizer as needed.      Relevant Medications   furosemide  (LASIX ) 20 MG tablet   Other Relevant Orders   Fe+TIBC+Fer   Vitamin B6     Other   Weakness   Relevant Medications   furosemide  (LASIX ) 20 MG tablet   Other Relevant Orders   Fe+TIBC+Fer   Vitamin B6   Swelling of left lower extremity   Relevant Medications   furosemide  (LASIX ) 20 MG tablet   Other Visit Diagnoses       Paresthesia of both feet       Relevant Orders   Fe+TIBC+Fer   Vitamin B6      Paresthesias-will check for iron deficiency it sounds like it could be a little bit of restless leg but I  also think it could just be peripheral neuropathy.  We did do some labs which were essentially normal although they had to cancel the B6.  Return in about 4 months (around 06/24/2024) for COPD.    Dorothyann Byars, MD

## 2024-02-22 NOTE — Patient Instructions (Signed)
  Ms. Rosenburg , Thank you for taking time to come for your Medicare Wellness Visit. I appreciate your ongoing commitment to your health goals. Please review the following plan we discussed and let me know if I can assist you in the future.   These are the goals we discussed:  Goals       DIET - REDUCE SODIUM INTAKE      DIET - REDUCE SUGAR INTAKE      Decrease sugar and salt intake and watch her diet better      Medication Management      Patient Goals/Self-Care Activities Over the next 90 days, patient will:  take medications as prescribed  Follow Up Plan: Telephone follow up appointment with care management team member scheduled for:  3 months       Patient Stated (pt-stated)      08/19/2020 AWV Goal: Improved Nutrition/Diet  Patient will verbalize understanding that diet plays an important role in overall health and that a poor diet is a risk factor for many chronic medical conditions.  Over the next year, patient will improve self management of their diet by incorporating decreased sodium intake. Patient will utilize available community resources to help with food acquisition if needed (ex: food pantries, [address], etc) Patient will work with nutrition specialist if a referral was made       Patient Stated (pt-stated)      Patient would like to continue to stay healthy.      Patient Stated (pt-stated)      Patient stated that she would like to be able to write better and work on her speech since her stroke.       Patient Stated      Patient would like to be as healthy and active.         This is a list of the screening recommended for you and due dates:  Health Maintenance  Topic Date Due   COVID-19 Vaccine (1) Never done   Zoster (Shingles) Vaccine (1 of 2) [birth year]27/1952   DTaP/Tdap/Td vaccine (2 - Tdap) 12/05/2018   Flu Shot  03/03/2024   Medicare Annual Wellness Visit  02/21/2025   Pneumococcal Vaccine for age over 87  Completed   DEXA scan (bone density  measurement)  Completed   Hepatitis B Vaccine  Aged Out   HPV Vaccine  Aged Out   Meningitis B Vaccine  Aged Out

## 2024-02-22 NOTE — Progress Notes (Signed)
 Pt states that she is doing well on the nebulizer.   The weakness that she was experiencing when she was last seen on 7/10 has gotten better. She continues to wear the compression stockings.   She would like a refill of the Furosemide  20mg  to be sent to her mail order pharmacy

## 2024-02-23 ENCOUNTER — Ambulatory Visit: Payer: Self-pay | Admitting: Family Medicine

## 2024-02-23 NOTE — Progress Notes (Signed)
 Hi Abisola,  Total iron and ferritin look good.  Vitamin B6 is still pending.

## 2024-02-26 LAB — IRON,TIBC AND FERRITIN PANEL
Ferritin: 65 ng/mL (ref 15–150)
Iron Saturation: 28 % (ref 15–55)
Iron: 84 ug/dL (ref 27–139)
Total Iron Binding Capacity: 302 ug/dL (ref 250–450)
UIBC: 218 ug/dL (ref 118–369)

## 2024-02-26 LAB — VITAMIN B6: Vitamin B6: 11.4 ug/L (ref 3.4–65.2)

## 2024-02-28 NOTE — Progress Notes (Signed)
 Vitamin B6 looks great as well

## 2024-03-03 ENCOUNTER — Other Ambulatory Visit: Payer: Self-pay | Admitting: Family Medicine

## 2024-03-03 DIAGNOSIS — E782 Mixed hyperlipidemia: Secondary | ICD-10-CM

## 2024-03-13 ENCOUNTER — Other Ambulatory Visit: Payer: Self-pay | Admitting: Family Medicine

## 2024-03-13 DIAGNOSIS — I10 Essential (primary) hypertension: Secondary | ICD-10-CM

## 2024-04-03 ENCOUNTER — Other Ambulatory Visit: Payer: Self-pay | Admitting: Family Medicine

## 2024-04-03 DIAGNOSIS — J4489 Other specified chronic obstructive pulmonary disease: Secondary | ICD-10-CM

## 2024-04-03 DIAGNOSIS — R531 Weakness: Secondary | ICD-10-CM

## 2024-04-03 DIAGNOSIS — M7989 Other specified soft tissue disorders: Secondary | ICD-10-CM

## 2024-04-04 ENCOUNTER — Encounter: Payer: Self-pay | Admitting: Sports Medicine

## 2024-06-20 ENCOUNTER — Ambulatory Visit (INDEPENDENT_AMBULATORY_CARE_PROVIDER_SITE_OTHER): Admitting: Family Medicine

## 2024-06-20 ENCOUNTER — Encounter: Payer: Self-pay | Admitting: Family Medicine

## 2024-06-20 VITALS — BP 127/55 | HR 78 | Ht 61.42 in | Wt 116.0 lb

## 2024-06-20 DIAGNOSIS — J4489 Other specified chronic obstructive pulmonary disease: Secondary | ICD-10-CM

## 2024-06-20 DIAGNOSIS — R7301 Impaired fasting glucose: Secondary | ICD-10-CM | POA: Diagnosis not present

## 2024-06-20 DIAGNOSIS — N1831 Chronic kidney disease, stage 3a: Secondary | ICD-10-CM

## 2024-06-20 DIAGNOSIS — K439 Ventral hernia without obstruction or gangrene: Secondary | ICD-10-CM

## 2024-06-20 DIAGNOSIS — J4550 Severe persistent asthma, uncomplicated: Secondary | ICD-10-CM | POA: Diagnosis not present

## 2024-06-20 DIAGNOSIS — Z23 Encounter for immunization: Secondary | ICD-10-CM

## 2024-06-20 DIAGNOSIS — H8110 Benign paroxysmal vertigo, unspecified ear: Secondary | ICD-10-CM | POA: Diagnosis not present

## 2024-06-20 DIAGNOSIS — J441 Chronic obstructive pulmonary disease with (acute) exacerbation: Secondary | ICD-10-CM

## 2024-06-20 LAB — POCT GLYCOSYLATED HEMOGLOBIN (HGB A1C): Hemoglobin A1C: 5.9 % — AB (ref 4.0–5.6)

## 2024-06-20 MED ORDER — BENZONATATE 200 MG PO CAPS: 200.0000 mg | ORAL_CAPSULE | Freq: Two times a day (BID) | ORAL | 0 refills | Status: DC | PRN

## 2024-06-20 MED ORDER — AZITHROMYCIN 250 MG PO TABS: 500.0000 mg | ORAL_TABLET | ORAL | 1 refills | Status: AC

## 2024-06-20 MED ORDER — MECLIZINE HCL 25 MG PO TABS: 25.0000 mg | ORAL_TABLET | Freq: Three times a day (TID) | ORAL | 1 refills | Status: AC | PRN

## 2024-06-20 NOTE — Progress Notes (Signed)
 Established Patient Office Visit  Patient ID: COMFORT IVERSEN, female    DOB: February 28, 1933  Age: 89 y.o. MRN: [id number] PCP: Alvan Dorothyann BIRCH, MD  Chief Complaint  Patient presents with   COPD   ifg    Subjective:     HPI  Discussed the use of AI scribe software for clinical note transcription with the patient, who gave verbal consent to proceed.  History of Present Illness Erika Lynch is an 89 year old female who presents with chronic cough management.  Chronic cough with COPD - Persistent cough ongoing for an extended period - Cough is predominantly dry with minimal mucus production - No recent sore throat or fever - Azithromycin  regimen (three times weekly, then reduced to once weekly) previously effective in managing symptoms  Hernia sensation over left lower abdomen associated with cough - Sensation of a 'little hernia' felt during coughing - No pain or soreness associated with the hernia sensation - Avoids heavy lifting to prevent exacerbation  Dizziness - Intermittent dizziness managed with occasional use of a 'dizzy pill' - Dizziness improves with medication  Cardiopulmonary symptoms - No chest pain - No heart palpitations - No swelling     ROS    Objective:     BP (!) 127/55   Pulse 78   Ht 5' 1.42 (1.56 m)   Wt 116 lb (52.6 kg)   SpO2 100%   BMI 21.62 kg/m    Physical Exam Vitals and nursing note reviewed.  Constitutional:      Appearance: Normal appearance.  HENT:     Head: Normocephalic and atraumatic.  Eyes:     Conjunctiva/sclera: Conjunctivae normal.  Cardiovascular:     Rate and Rhythm: Normal rate and regular rhythm.  Pulmonary:     Effort: Pulmonary effort is normal.     Breath sounds: Normal breath sounds.  Skin:    General: Skin is warm and dry.  Neurological:     Mental Status: She is alert.  Psychiatric:        Mood and Affect: Mood normal.      Results for orders placed or performed in visit on  06/20/24  POCT HgB A1C  Result Value Ref Range   Hemoglobin A1C 5.9 (A) 4.0 - 5.6 %   HbA1c POC (<> result, manual entry)     HbA1c, POC (prediabetic range)     HbA1c, POC (controlled diabetic range)        The ASCVD Risk score (Arnett DK, et al., 2019) failed to calculate for the following reasons:   The 2019 ASCVD risk score is only valid for ages 5 to 37   Risk score cannot be calculated because patient has a medical history suggesting prior/existing ASCVD    Assessment & Plan:   Problem List Items Addressed This Visit       Respiratory   Severe persistent asthmatic bronchitis without complication (HCC) - Primary   Severe persistent asthma bronchitis  Chronic cough with occasional dryness. Previous azithromycin  regimen was effective. No acute flare symptoms. - Prescribed azithromycin  three times a week for three weeks. - Prescribed Tessalon Perles for cough management.      Relevant Medications   azithromycin  (ZITHROMAX ) 250 MG tablet (Start on 06/21/2024)   COPD with asthma (HCC)   Relevant Medications   azithromycin  (ZITHROMAX ) 250 MG tablet (Start on 06/21/2024)   benzonatate (TESSALON) 200 MG capsule   COPD exacerbation (HCC)   Relevant Medications   azithromycin  (ZITHROMAX ) 250 MG tablet (  Start on 06/21/2024)   benzonatate (TESSALON) 200 MG capsule     Endocrine   IFG (impaired fasting glucose)   Impaired fasting glucose (prediabetes) A1c is 5.9, indicating prediabetes. - Continue monitoring A1c levels. - stable.       Relevant Orders   POCT HgB A1C (Completed)     Nervous and Auditory   BPPV (benign paroxysmal positional vertigo)   Benign paroxysmal vertigo Intermittent dizziness managed with medication. Symptoms well-controlled with minimal episodes.      Relevant Medications   meclizine  (ANTIVERT ) 25 MG tablet     Genitourinary   CKD stage G3a/A1, GFR 45-59 and albumin creatinine ratio <30 mg/g (HCC)   Continue to monitor every 6 months.        Relevant Orders   CMP14+EGFR   Other Visit Diagnoses       Encounter for immunization       Relevant Orders   Flu vaccine HIGH DOSE PF(Fluzone Trivalent) (Completed)     Hernia of abdominal wall           Assessment and Plan Assessment & Plan  Abdominal wall hernia Likely due to chronic coughing. No pain or soreness. No surgical intervention required unless symptoms worsen. - Advised against heavy lifting to prevent hernia enlargement. - Instructed to monitor for pain or worsening symptoms.  Return in about 6 months (around 12/18/2024) for Pre-diabetes.    Dorothyann Byars, MD [facility]

## 2024-06-20 NOTE — Assessment & Plan Note (Signed)
 Impaired fasting glucose (prediabetes) A1c is 5.9, indicating prediabetes. - Continue monitoring A1c levels. - stable.

## 2024-06-20 NOTE — Assessment & Plan Note (Signed)
 Severe persistent asthma bronchitis  Chronic cough with occasional dryness. Previous azithromycin  regimen was effective. No acute flare symptoms. - Prescribed azithromycin  three times a week for three weeks. - Prescribed Tessalon Perles for cough management.

## 2024-06-20 NOTE — Assessment & Plan Note (Signed)
 Benign paroxysmal vertigo Intermittent dizziness managed with medication. Symptoms well-controlled with minimal episodes.

## 2024-06-20 NOTE — Assessment & Plan Note (Signed)
 Continue to monitor every 6 months

## 2024-06-21 ENCOUNTER — Ambulatory Visit: Payer: Self-pay | Admitting: Family Medicine

## 2024-06-21 LAB — CMP14+EGFR
ALT: 15 IU/L (ref 0–32)
AST: 21 IU/L (ref 0–40)
Albumin: 4.2 g/dL (ref 3.7–4.7)
Alkaline Phosphatase: 49 IU/L (ref 48–129)
BUN/Creatinine Ratio: 23 (ref 12–28)
BUN: 23 mg/dL (ref 8–27)
Bilirubin Total: 0.2 mg/dL (ref 0.0–1.2)
CO2: 22 mmol/L (ref 20–29)
Calcium: 10 mg/dL (ref 8.7–10.3)
Chloride: 100 mmol/L (ref 96–106)
Creatinine, Ser: 0.98 mg/dL (ref 0.57–1.00)
Globulin, Total: 2.3 g/dL (ref 1.5–4.5)
Glucose: 105 mg/dL — ABNORMAL HIGH (ref 70–99)
Potassium: 4.2 mmol/L (ref 3.5–5.2)
Sodium: 137 mmol/L (ref 134–144)
Total Protein: 6.5 g/dL (ref 6.0–8.5)
eGFR: 55 mL/min/1.73 — ABNORMAL LOW (ref >59)

## 2024-06-21 NOTE — Progress Notes (Signed)
 Your lab work is within acceptable range and there are no concerning findings.   ?

## 2024-06-25 ENCOUNTER — Other Ambulatory Visit: Payer: Self-pay | Admitting: Family Medicine

## 2024-06-25 DIAGNOSIS — I63412 Cerebral infarction due to embolism of left middle cerebral artery: Secondary | ICD-10-CM

## 2024-06-25 DIAGNOSIS — K2100 Gastro-esophageal reflux disease with esophagitis, without bleeding: Secondary | ICD-10-CM

## 2024-07-04 ENCOUNTER — Other Ambulatory Visit: Payer: Self-pay | Admitting: Family Medicine

## 2024-07-04 DIAGNOSIS — J441 Chronic obstructive pulmonary disease with (acute) exacerbation: Secondary | ICD-10-CM

## 2024-07-04 DIAGNOSIS — J4489 Other specified chronic obstructive pulmonary disease: Secondary | ICD-10-CM

## 2024-08-14 ENCOUNTER — Other Ambulatory Visit: Payer: Self-pay | Admitting: Family Medicine

## 2024-08-14 DIAGNOSIS — J441 Chronic obstructive pulmonary disease with (acute) exacerbation: Secondary | ICD-10-CM

## 2024-08-14 DIAGNOSIS — J4489 Other specified chronic obstructive pulmonary disease: Secondary | ICD-10-CM

## 2024-12-21 ENCOUNTER — Ambulatory Visit: Admitting: Family Medicine
